# Patient Record
Sex: Male | Born: 1940 | Race: White | Hispanic: No | Smoking: Former smoker
Health system: Southern US, Community
[De-identification: ages and names within clinical notes are randomized; demographics above are authoritative.]

## PROBLEM LIST (undated history)

## (undated) DIAGNOSIS — T8859XA Other complications of anesthesia, initial encounter: Secondary | ICD-10-CM

## (undated) DIAGNOSIS — C679 Malignant neoplasm of bladder, unspecified: Secondary | ICD-10-CM

## (undated) DIAGNOSIS — H8110 Benign paroxysmal vertigo, unspecified ear: Secondary | ICD-10-CM

## (undated) DIAGNOSIS — I77810 Thoracic aortic ectasia: Secondary | ICD-10-CM

## (undated) DIAGNOSIS — N529 Male erectile dysfunction, unspecified: Secondary | ICD-10-CM

## (undated) DIAGNOSIS — Z860101 Personal history of adenomatous and serrated colon polyps: Secondary | ICD-10-CM

## (undated) DIAGNOSIS — Z86010 Personal history of colonic polyps: Secondary | ICD-10-CM

## (undated) DIAGNOSIS — N401 Enlarged prostate with lower urinary tract symptoms: Secondary | ICD-10-CM

## (undated) DIAGNOSIS — Z87442 Personal history of urinary calculi: Secondary | ICD-10-CM

## (undated) DIAGNOSIS — J189 Pneumonia, unspecified organism: Secondary | ICD-10-CM

## (undated) DIAGNOSIS — I10 Essential (primary) hypertension: Secondary | ICD-10-CM

## (undated) DIAGNOSIS — E538 Deficiency of other specified B group vitamins: Secondary | ICD-10-CM

## (undated) DIAGNOSIS — N400 Enlarged prostate without lower urinary tract symptoms: Secondary | ICD-10-CM

## (undated) DIAGNOSIS — M1990 Unspecified osteoarthritis, unspecified site: Secondary | ICD-10-CM

## (undated) DIAGNOSIS — T7840XA Allergy, unspecified, initial encounter: Secondary | ICD-10-CM

## (undated) DIAGNOSIS — Z8551 Personal history of malignant neoplasm of bladder: Secondary | ICD-10-CM

## (undated) DIAGNOSIS — F419 Anxiety disorder, unspecified: Secondary | ICD-10-CM

## (undated) HISTORY — DX: Essential (primary) hypertension: I10

## (undated) HISTORY — DX: Allergy, unspecified, initial encounter: T78.40XA

## (undated) HISTORY — DX: Malignant neoplasm of bladder, unspecified: C67.9

## (undated) HISTORY — DX: Deficiency of other specified B group vitamins: E53.8

## (undated) HISTORY — PX: TONGUE SURGERY: SHX810

## (undated) HISTORY — DX: Male erectile dysfunction, unspecified: N52.9

## (undated) HISTORY — PX: CATARACT EXTRACTION: SUR2

## (undated) HISTORY — DX: Unspecified osteoarthritis, unspecified site: M19.90

## (undated) HISTORY — DX: Benign prostatic hyperplasia without lower urinary tract symptoms: N40.0

## (undated) HISTORY — DX: Anxiety disorder, unspecified: F41.9

## (undated) HISTORY — DX: Personal history of urinary calculi: Z87.442

## (undated) HISTORY — PX: COLONOSCOPY: SHX174

## (undated) HISTORY — PX: LITHOTRIPSY: SUR834

## (undated) HISTORY — DX: Personal history of colonic polyps: Z86.010

## (undated) HISTORY — PX: TRANSURETHRAL RESECTION OF BLADDER TUMOR: SHX2575

## (undated) HISTORY — PX: OTHER SURGICAL HISTORY: SHX169

## (undated) HISTORY — PX: EXTRACORPOREAL SHOCK WAVE LITHOTRIPSY: SHX1557

---

## 1997-11-04 ENCOUNTER — Emergency Department (HOSPITAL_COMMUNITY): Admission: EM | Admit: 1997-11-04 | Discharge: 1997-11-04 | Payer: Self-pay | Admitting: Emergency Medicine

## 1997-11-07 ENCOUNTER — Ambulatory Visit (HOSPITAL_COMMUNITY): Admission: RE | Admit: 1997-11-07 | Discharge: 1997-11-07 | Payer: Self-pay | Admitting: Urology

## 1997-11-11 ENCOUNTER — Ambulatory Visit (HOSPITAL_COMMUNITY): Admission: RE | Admit: 1997-11-11 | Discharge: 1997-11-11 | Payer: Self-pay | Admitting: Urology

## 1997-12-23 ENCOUNTER — Ambulatory Visit (HOSPITAL_COMMUNITY): Admission: RE | Admit: 1997-12-23 | Discharge: 1997-12-23 | Payer: Self-pay | Admitting: Urology

## 1998-02-09 ENCOUNTER — Ambulatory Visit (HOSPITAL_COMMUNITY): Admission: RE | Admit: 1998-02-09 | Discharge: 1998-02-09 | Payer: Self-pay | Admitting: Family Medicine

## 1999-08-06 ENCOUNTER — Other Ambulatory Visit: Admission: RE | Admit: 1999-08-06 | Discharge: 1999-08-06 | Payer: Self-pay | Admitting: Gastroenterology

## 1999-08-06 ENCOUNTER — Encounter (INDEPENDENT_AMBULATORY_CARE_PROVIDER_SITE_OTHER): Payer: Self-pay

## 2002-09-14 ENCOUNTER — Encounter: Payer: Self-pay | Admitting: Family Medicine

## 2002-09-14 ENCOUNTER — Encounter: Admission: RE | Admit: 2002-09-14 | Discharge: 2002-09-14 | Payer: Self-pay | Admitting: Family Medicine

## 2004-11-12 ENCOUNTER — Ambulatory Visit: Payer: Self-pay | Admitting: Gastroenterology

## 2004-12-10 ENCOUNTER — Encounter (INDEPENDENT_AMBULATORY_CARE_PROVIDER_SITE_OTHER): Payer: Self-pay | Admitting: *Deleted

## 2004-12-10 ENCOUNTER — Ambulatory Visit: Payer: Self-pay | Admitting: Gastroenterology

## 2005-09-27 ENCOUNTER — Encounter: Admission: RE | Admit: 2005-09-27 | Discharge: 2005-09-27 | Payer: Self-pay | Admitting: Family Medicine

## 2005-11-18 ENCOUNTER — Ambulatory Visit (HOSPITAL_COMMUNITY): Admission: RE | Admit: 2005-11-18 | Discharge: 2005-11-18 | Payer: Self-pay | Admitting: Urology

## 2006-01-23 ENCOUNTER — Ambulatory Visit (HOSPITAL_COMMUNITY): Admission: RE | Admit: 2006-01-23 | Discharge: 2006-01-23 | Payer: Self-pay | Admitting: Urology

## 2006-01-30 ENCOUNTER — Encounter: Admission: RE | Admit: 2006-01-30 | Discharge: 2006-02-04 | Payer: Self-pay | Admitting: Family Medicine

## 2007-06-04 HISTORY — PX: POLYPECTOMY: SHX149

## 2007-12-07 ENCOUNTER — Ambulatory Visit: Payer: Self-pay | Admitting: Gastroenterology

## 2007-12-21 ENCOUNTER — Ambulatory Visit: Payer: Self-pay | Admitting: Gastroenterology

## 2007-12-21 ENCOUNTER — Encounter: Payer: Self-pay | Admitting: Gastroenterology

## 2007-12-21 LAB — HM COLONOSCOPY

## 2007-12-23 ENCOUNTER — Encounter: Payer: Self-pay | Admitting: Gastroenterology

## 2008-01-03 ENCOUNTER — Encounter: Payer: Self-pay | Admitting: Internal Medicine

## 2008-01-03 LAB — CONVERTED CEMR LAB: PSA: NORMAL ng/mL

## 2008-02-18 ENCOUNTER — Encounter: Admission: RE | Admit: 2008-02-18 | Discharge: 2008-02-18 | Payer: Self-pay | Admitting: Family Medicine

## 2008-06-03 HISTORY — PX: CATARACT EXTRACTION W/ INTRAOCULAR LENS IMPLANT: SHX1309

## 2008-09-07 ENCOUNTER — Ambulatory Visit: Payer: Self-pay | Admitting: Internal Medicine

## 2008-09-07 DIAGNOSIS — N400 Enlarged prostate without lower urinary tract symptoms: Secondary | ICD-10-CM | POA: Insufficient documentation

## 2008-09-07 DIAGNOSIS — Z860100 Personal history of colon polyps, unspecified: Secondary | ICD-10-CM | POA: Insufficient documentation

## 2008-09-07 DIAGNOSIS — Z87442 Personal history of urinary calculi: Secondary | ICD-10-CM | POA: Insufficient documentation

## 2008-09-07 DIAGNOSIS — I10 Essential (primary) hypertension: Secondary | ICD-10-CM | POA: Insufficient documentation

## 2008-09-13 ENCOUNTER — Encounter: Payer: Self-pay | Admitting: Internal Medicine

## 2009-04-02 ENCOUNTER — Encounter: Admission: RE | Admit: 2009-04-02 | Discharge: 2009-04-02 | Payer: Self-pay | Admitting: Orthopaedic Surgery

## 2009-12-08 ENCOUNTER — Ambulatory Visit: Payer: Self-pay | Admitting: Internal Medicine

## 2009-12-11 ENCOUNTER — Ambulatory Visit (HOSPITAL_COMMUNITY): Admission: RE | Admit: 2009-12-11 | Discharge: 2009-12-11 | Payer: Self-pay | Admitting: Urology

## 2009-12-11 LAB — CONVERTED CEMR LAB
ALT: 21 units/L (ref 0–53)
AST: 21 units/L (ref 0–37)
Albumin: 4.3 g/dL (ref 3.5–5.2)
Alkaline Phosphatase: 74 units/L (ref 39–117)
BUN: 10 mg/dL (ref 6–23)
Basophils Absolute: 0 10*3/uL (ref 0.0–0.1)
Basophils Relative: 1 % (ref 0–1)
CO2: 24 meq/L (ref 19–32)
Calcium: 9.9 mg/dL (ref 8.4–10.5)
Chloride: 104 meq/L (ref 96–112)
Creatinine, Ser: 0.82 mg/dL (ref 0.40–1.50)
Eosinophils Absolute: 0.1 10*3/uL (ref 0.0–0.7)
Eosinophils Relative: 2 % (ref 0–5)
Glucose, Bld: 87 mg/dL (ref 70–99)
HCT: 41.6 % (ref 39.0–52.0)
Hemoglobin: 14.4 g/dL (ref 13.0–17.0)
Lymphocytes Relative: 33 % (ref 12–46)
Lymphs Abs: 2.2 10*3/uL (ref 0.7–4.0)
MCHC: 34.6 g/dL (ref 30.0–36.0)
MCV: 91.8 fL (ref 78.0–100.0)
Monocytes Absolute: 0.8 10*3/uL (ref 0.1–1.0)
Monocytes Relative: 12 % (ref 3–12)
Neutro Abs: 3.5 10*3/uL (ref 1.7–7.7)
Neutrophils Relative %: 53 % (ref 43–77)
Platelets: 285 10*3/uL (ref 150–400)
Potassium: 4.7 meq/L (ref 3.5–5.3)
RBC: 4.53 M/uL (ref 4.22–5.81)
RDW: 13.3 % (ref 11.5–15.5)
Sodium: 139 meq/L (ref 135–145)
TSH: 2.044 microintl units/mL (ref 0.350–4.500)
Total Bilirubin: 0.8 mg/dL (ref 0.3–1.2)
Total Protein: 7.1 g/dL (ref 6.0–8.3)
WBC: 6.6 10*3/uL (ref 4.0–10.5)

## 2009-12-13 ENCOUNTER — Emergency Department (HOSPITAL_COMMUNITY): Admission: EM | Admit: 2009-12-13 | Discharge: 2009-12-13 | Payer: Self-pay | Admitting: Emergency Medicine

## 2009-12-15 ENCOUNTER — Ambulatory Visit (HOSPITAL_COMMUNITY): Admission: AD | Admit: 2009-12-15 | Discharge: 2009-12-15 | Payer: Self-pay | Admitting: Urology

## 2009-12-15 HISTORY — PX: CYSTOSCOPY/URETEROSCOPY/HOLMIUM LASER/STENT PLACEMENT: SHX6546

## 2010-01-03 ENCOUNTER — Observation Stay (HOSPITAL_COMMUNITY): Admission: RE | Admit: 2010-01-03 | Discharge: 2010-01-04 | Payer: Self-pay | Admitting: Urology

## 2010-01-03 ENCOUNTER — Encounter (INDEPENDENT_AMBULATORY_CARE_PROVIDER_SITE_OTHER): Payer: Self-pay | Admitting: Urology

## 2010-02-19 ENCOUNTER — Ambulatory Visit (HOSPITAL_BASED_OUTPATIENT_CLINIC_OR_DEPARTMENT_OTHER): Admission: RE | Admit: 2010-02-19 | Discharge: 2010-02-19 | Payer: Self-pay | Admitting: Urology

## 2010-04-03 ENCOUNTER — Encounter: Payer: Self-pay | Admitting: Internal Medicine

## 2010-07-01 LAB — CONVERTED CEMR LAB
ALT: 20 units/L (ref 0–53)
AST: 22 units/L (ref 0–37)
Albumin: 4.3 g/dL (ref 3.5–5.2)
Alkaline Phosphatase: 74 units/L (ref 39–117)
BUN: 11 mg/dL (ref 6–23)
Basophils Absolute: 0 10*3/uL (ref 0.0–0.1)
Basophils Relative: 0.1 % (ref 0.0–3.0)
Bilirubin, Direct: 0 mg/dL (ref 0.0–0.3)
CO2: 30 meq/L (ref 19–32)
Calcium: 9.5 mg/dL (ref 8.4–10.5)
Chloride: 110 meq/L (ref 96–112)
Creatinine, Ser: 0.8 mg/dL (ref 0.4–1.5)
Eosinophils Absolute: 0.1 10*3/uL (ref 0.0–0.7)
Eosinophils Relative: 2.3 % (ref 0.0–5.0)
GFR calc non Af Amer: 102.1 mL/min (ref >60)
Glucose, Bld: 85 mg/dL (ref 70–99)
HCT: 44.1 % (ref 39.0–52.0)
Hemoglobin: 15.2 g/dL (ref 13.0–17.0)
Lymphocytes Relative: 36.4 % (ref 12.0–46.0)
Lymphs Abs: 2 10*3/uL (ref 0.7–4.0)
MCHC: 34.5 g/dL (ref 30.0–36.0)
MCV: 94.9 fL (ref 78.0–100.0)
Monocytes Absolute: 0.8 10*3/uL (ref 0.1–1.0)
Monocytes Relative: 13.6 % — ABNORMAL HIGH (ref 3.0–12.0)
Neutro Abs: 2.7 10*3/uL (ref 1.4–7.7)
Neutrophils Relative %: 47.6 % (ref 43.0–77.0)
Platelets: 244 10*3/uL (ref 150.0–400.0)
Potassium: 4.6 meq/L (ref 3.5–5.1)
RBC: 4.65 M/uL (ref 4.22–5.81)
RDW: 12.9 % (ref 11.5–14.6)
Sodium: 144 meq/L (ref 135–145)
TSH: 2.3 microintl units/mL (ref 0.35–5.50)
Total Bilirubin: 1.1 mg/dL (ref 0.3–1.2)
Total Protein: 7.3 g/dL (ref 6.0–8.3)
WBC: 5.6 10*3/uL (ref 4.5–10.5)

## 2010-07-03 NOTE — Letter (Signed)
Summary: Patient Notice- Polyp Results  [facility]  [address] [place], [place] [address]   Phone: [phone]  Fax: [phone]        December 23, 2007 MRN: [id number]    Richard King [address] [place], [place]  [address]    Dear [name],  I am pleased to inform you that the colon polyp(s) removed during your recent colonoscopy was (were) found to be benign (no cancer detected) upon pathologic examination.  I recommend you have a repeat colonoscopy examination in 5_ years to look for recurrent polyps, as having colon polyps increases your risk for having recurrent polyps or even colon cancer in the future.  Should you develop new or worsening symptoms of abdominal pain, bowel habit changes or bleeding from the rectum or bowels, please schedule an evaluation with either your primary care physician or with me.  Additional information/recommendations:  __ No further action with gastroenterology is needed at this time. Please      follow-up with your primary care physician for your other healthcare      needs.  __ Please call [phone] to schedule a return visit to review your      situation.  __ Please keep your follow-up visit as already scheduled.  xx__ Continue treatment plan as outlined the day of your exam.  Please call us if you are having persistent problems or have questions about your condition that have not been fully answered at this time.  Sincerely,  Mardella Layman MD [facility]  This letter has been electronically signed by your physician.

## 2010-07-03 NOTE — Assessment & Plan Note (Signed)
Summary: NEW PT TO EST/CLE   Vital Signs:  Patient profile:   70 year old male Height:      71 inches Weight:      224 pounds BMI:     31.35 Temp:     98 degrees F oral Pulse rate:   70 / minute Pulse rhythm:   regular BP sitting:   120 / 68  (left arm) Cuff size:   regular  Vitals Entered By: Mervin Hack CMA (September 07, 2008 11:40 AM)  History of Present Illness: CC: new patient to establish care  His doctor retired Hospital doctor to Dr Artis Flock  for 3 years--left due to Programmer, multimedia  HTN goes back to 2002 No problems with current medicine No heart problems  Kidney stones requiring lithotripsy several times HAd rectal exam and PSA test by Dr Konrad Dolores him once a year  Notes urinary problems Nocturia x 3-4 No sig daytime symptoms  has had colon and polyps Dr Jarold Motto   Preventive Screening-Counseling & Management     Smoking Status: quit  Allergies (verified): 1)  ! 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 Eyes:  Denies double vision and vision loss-1 eye. ENT:  Complains of decreased hearing; denies ringing in ears; hearing problems throughout both sides of family teeth okay--sees dentist. CV:   Complains of lightheadness and shortness of breath with exertion; denies chest pain or discomfort, difficulty breathing at night, difficulty breathing while lying down, fainting, palpitations, and swelling of feet; occ mild dizzy feeling--thinks it may be inner ear related Seems to be equilibrium problems chronic DOE--no recent change. Resp:  Denies cough and shortness of breath. GI:  Complains of constipation; denies abdominal pain, bloody stools, change in bowel habits, dark tarry stools, indigestion, nausea, and vomiting; occ mild constipation. GU:  Complains of erectile dysfunction and nocturia; denies urinary frequency and urinary hesitancy; did get Rx for viagra but never took it. MS:  Denies joint pain and joint swelling. Derm:  Denies lesion(s) and rash; gets itching and flaking in ears. Neuro:  Complains of tingling; denies headaches, numbness, and weakness; occ very brief tingling in hands. Psych:  Denies anxiety and depression. Heme:  Denies abnormal bruising and enlarge lymph nodes. Allergy:  Complains of itching eyes, seasonal allergies, and sneezing; mild spring allergies no meds.  Physical Exam  General:  alert and normal appearance.   Eyes:  pupils equal and pupils round.   Mouth:  no erythema and no lesions.   Neck:  supple, no masses, no thyromegaly, no carotid bruits, and no  cervical lymphadenopathy.   Lungs:  normal respiratory effort and normal breath sounds.   Heart:  normal rate, regular rhythm, no murmur, and no gallop.   Abdomen:  soft, non-tender, no masses, no hepatomegaly, and no splenomegaly.   Msk:  no joint tenderness and no joint swelling.   Pulses:  2+ in feet Extremities:  no edema Neurologic:  alert & oriented X3 and strength normal in all extremities.   Skin:  no rashes and no suspicious lesions.   Axillary Nodes:  No palpable lymphadenopathy Psych:  normally interactive, good eye contact, not anxious appearing, and not depressed appearing.      Impression & Recommendations:  Problem # 1:  HYPERTENSION (ICD-401.9) Assessment Comment Only  good control no changes needed  His updated medication list for this problem includes:    Verapamil Hcl Cr 240 Mg Cr-tabs (Verapamil hcl) .Marland Kitchen... Take 1 tablet by mouth once daily  BP today: 120/68  Orders: TLB-BMP (Basic Metabolic Panel-BMET) (80048-METABOL) TLB-CBC Platelet - w/Differential (85025-CBCD) TLB-Hepatic/Liver Function Pnl (80076-HEPATIC) TLB-TSH (Thyroid Stimulating Hormone) (84443-TSH) Venipuncture ([address])  Problem # 2:  BENIGN PROSTATIC HYPERTROPHY (ICD-600.00) Assessment: Unchanged mild symptoms but doing okay with meds  Problem # 3:  NEPHROLITHIASIS, HX OF (ICD-V13.01) Assessment: Comment Only sees Dr Isabel Caprice yearly he checks PSA also  Complete Medication List: 1)  Verapamil Hcl Cr 240 Mg Cr-tabs (Verapamil hcl) .... Take 1 tablet by mouth once daily  Other Orders: Tetanus Toxoid w/Dx (60454) Admin 1st Vaccine (09811)  Patient Instructions: 1)  Please schedule a follow-up appointment in 1 year.  Prescriptions: VERAPAMIL HCL CR 240 MG CR-TABS (VERAPAMIL HCL) take 1 tablet by mouth once daily  #30 x 12   Entered and Authorized by:   Cindee Salt MD   Signed by:   Cindee Salt MD on 09/07/2008   Method used:   Print then Give to Patient   RxID:   [id number]       Current Allergies (reviewed today): ! * IV DYE  Immunizations Administered:  Tetanus Vaccine:    Vaccine Type: Td    Site: left deltoid    Mfr: GlaxoSmithKline    Dose: 0.5 ml    Route: IM    Given by: Mervin Hack CMA    Exp. Date: 07/27/2010    Lot #: [id number]    VIS given: 04/21/07 version given September 07, 2008.    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

## 2010-07-03 NOTE — Letter (Signed)
Summary: [facility]  [facility]   Imported By: Maryln Gottron 04/09/2010 15:23:19  _____________________________________________________________________  External Attachment:    Type:   Image     Comment:   External Document  Appended Document: [facility] cystoscopy 3 months after removal of bladder tumor only mild atypia noted Planning to start BCG Rx

## 2010-07-03 NOTE — Miscellaneous (Signed)
Summary: previsit rx.  Clinical Lists Changes  Medications: Added new medication of MOVIPREP 100 GM  SOLR (PEG-KCL-NACL-NASULF-NA ASC-C) As per prep instructions. - Signed Rx of MOVIPREP 100 GM  SOLR (PEG-KCL-NACL-NASULF-NA ASC-C) As per prep instructions.;  #1 x 0;  Signed;  Entered by: Darlyn Read RN;  Authorized by: Mardella Layman MD [facility];  Method used: Print then Give to Patient    Prescriptions: MOVIPREP 100 GM  SOLR (PEG-KCL-NACL-NASULF-NA ASC-C) As per prep instructions.  #1 x 0   Entered by:   Darlyn Read RN   Authorized by:   Mardella Layman MD [facility]   Signed by:   Milford Cage CMA on 12/07/2007   Method used:   Print then Give to Patient   RxID:   [id number]

## 2010-07-03 NOTE — Miscellaneous (Signed)
Summary: PSA:  Clinical Lists Changes  Observations: Added new observation of PSA: normal@ 2.2 (01/03/2008 12:46)        Preventive Care Screening  PSA:    Date:  01/03/2008    Results:  normal@ 2.2 Dr. Isabel Caprice, [facility]

## 2010-07-03 NOTE — Miscellaneous (Signed)
Summary: Pneumovax  Clinical Lists Changes  Orders: Added new Service order of Pneumococcal Vaccine ([address]) - Signed Added new Service order of Admin 1st Vaccine ([address]454) - Signed Observations: Added new observation of PNEUMOVAXVIS: 12/30/95 version given September 07, 2008. (09/07/2008 12:55) Added new observation of PNEUMOVAXLOT: 0981X (09/07/2008 12:55) Added new observation of PNEUMOVAXEXP: 05/06/2009 (09/07/2008 12:55) Added new observation of PNEUMOVAXBY: DeShannon Smith CMA (09/07/2008 12:55) Added new observation of PNEUMOVAXRTE: IM (09/07/2008 12:55) Added new observation of PNEUMOVAXMFR: Merck (09/07/2008 12:55) Added new observation of PNEUMOVAXSIT: right deltoid (09/07/2008 12:55) Added new observation of PNEUMOVAX: Pneumovax (09/07/2008 12:55)      Pneumovax Vaccine    Vaccine Type: Pneumovax    Site: right deltoid    Mfr: Merck    Dose: 0.5 ml    Route: IM    Given by: Mervin Hack CMA    Exp. Date: 05/06/2009    Lot #: [id number]    VIS given: 12/30/95 version given September 07, 2008.

## 2010-07-03 NOTE — Assessment & Plan Note (Signed)
Summary: FOLLOW UP/[facility]H   Vital Signs:  Patient profile:   70 year old male Weight:      226 pounds BMI:     31.63 Temp:     98.2 degrees F oral Pulse rate:   68 / minute Pulse rhythm:   regular BP sitting:   124 / 90  (right arm) Cuff size:   large  Vitals Entered By: Lowella Petties CMA (December 08, 2009 2:16 PM) CC: follow-up visit   History of Present Illness: doing okay except for kidney stones needs lithotripsy next week for persistent stones Passed one  a while ago but has had several attacks while travelling in the past 3 weeks No hematuria now They are calcium stones citrate no help and allopurinol no help in the past  Voids fairly well slow stream nocturia x 2-3 at worst--better if he limits evening fluids No sig daytime problems  BP has always been okay---even with the kidney stones No headaches no chest pain Mild DOE---no sig change   Allergies: 1)  ! * Iv Dye  Past History:  Past medical, surgical, family and social histories (including risk factors) reviewed for relevance to current acute and chronic problems.  Past Medical History: Reviewed history from 09/07/2008 and no changes required. Hypertension Nephrolithiasis, hx of-----------------------------Dr Grapey Colonic polyps, hx of   Benign prostatic hypertrophy Erectile dysfunction  Past Surgical History: Reviewed history from 09/07/2008 and no changes required. 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 Mom died @94  of old age Dad died @76  had HTN, ??cause of death 1 sister-8 years older Some CAD on Dad's side No DM No prostate or colon cancer  Social History: Reviewed history from 09/07/2008 and no changes required. 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         wt fairly stable appetite is fine  Physical Exam  General:  alert and normal appearance.   Neck:  supple, no masses, no thyromegaly, no carotid bruits, and no cervical lymphadenopathy.   Lungs:  normal respiratory effort and normal breath sounds.   Heart:  normal rate, regular rhythm, no murmur, and no gallop.   Abdomen:  soft and non-tender.   Extremities:  no edema Psych:  normally interactive, good eye contact, not anxious appearing, and not depressed appearing.     Impression & Recommendations:  Problem # 1:  HYPERTENSION (ICD-401.9) Assessment Unchanged  good control no changes needed  His updated medication list for this problem includes:    Verapamil Hcl Cr 240 Mg Cr-tabs (Verapamil hcl) .Marland Kitchen... Take 1 tablet by mouth once daily  BP today: 124/90 Prior BP: 120/68 (09/07/2008)  Labs Reviewed: K+: 4.6 (09/07/2008) Creat: : 0.8 (09/07/2008)     Orders: Venipuncture (54098) Specimen Handling (11914) T-Comprehensive Metabolic Panel (78295-62130) T-CBC w/Diff (86578-46962) T-TSH (95284-13244)  Problem # 2:  NEPHROLITHIASIS, HX OF (ICD-V13.01) Assessment: Comment Only new recurrences Dr Isabel Caprice treating will recheck labs  ?stone type  Problem # 3:  BENIGN PROSTATIC HYPERTROPHY (ICD-600.00) Assessment: Unchanged mild symptoms with increased fluid intake due to stones but no major problems  Complete Medication List: 1)  Verapamil Hcl Cr 240 Mg Cr-tabs (Verapamil hcl) .... Take 1 tablet by mouth once daily  Patient Instructions: 1)  Please schedule a follow-up appointment in 1 year.  Prescriptions: VERAPAMIL HCL CR 240 MG CR-TABS (VERAPAMIL HCL) take 1 tablet by mouth once daily  #30 x 12   Entered and Authorized by:   Cindee Salt MD   Signed by:   Cindee Salt MD on 12/08/2009   Method used:   Print then Give to Patient   RxID:   [id number]   Prior  Medications (reviewed today): VERAPAMIL HCL CR 240 MG CR-TABS (VERAPAMIL HCL) take 1 tablet by mouth once daily Current Allergies: ! * IV DYE

## 2010-07-03 NOTE — Procedures (Signed)
Summary: Colonoscopy   Colonoscopy  Procedure date:  12/21/2007  Findings:      Location:  [facility].    Procedures Next Due Date:    Colonoscopy: 01/2013  Patient Name: Chirag, Richard King MRN:  Procedure Procedures: Colonoscopy CPT: 63016.  Personnel: Endoscopist: Vania Rea. Jarold Motto, MD.  Exam Location: Exam performed in Outpatient Clinic. Outpatient  Patient Consent: Procedure, Alternatives, Risks and Benefits discussed, consent obtained, from patient. Consent was obtained by the RN.  Indications  Surveillance of: Adenomatous Polyp(s).  History  Current Medications: Patient is not currently taking Coumadin.  Medical/ Surgical History: Hypertension,  Pre-Exam Physical: Performed Dec 10, 2004. Entire physical exam was normal. Cardio- pulmonary exam, Rectal exam, Abdominal exam, Extremity exam, Mental status exam WNL.  Comments: Pt. history reviewed/updated, physical exam performed prior to initiation of sedation? YES Exam Exam: Extent of exam reached: Cecum, extent intended: Cecum.  The cecum was identified by appendiceal orifice and IC valve. Patient position: on left side. Time to Cecum: 00:02:55. Time for Withdrawl: 00:05:38. Colon retroflexion performed. Images taken. ASA Classification: II. Tolerance: excellent.  Monitoring: Pulse and BP monitoring, Oximetry used. Supplemental O2 given. at 2 Liters.  Colon Prep Used Golytely for colon prep. Prep results: good.  Sedation Meds: Patient assessed and found to be appropriate for moderate (conscious) sedation. Sedation was managed by the Endoscopist. Fentanyl 75 mcg. given IV. Versed 7 mg. given IV.  Instrument(s): CF 140L. Serial [id number].  Findings - NORMAL EXAM: Cecum to Splenic Flexure. Not Seen: Polyps. Colitis. Tumors. Crohn's.  - DIVERTICULOSIS: Descending Colon to Sigmoid Colon. Not bleeding. ICD9: Diverticulosis, Colon: 562.10. Comments: PIGMENTARY CHANGES OF MELANOSIS COLI  NOTED.  - POLYP: Sigmoid Colon, Maximum size: 4 mm. sessile polyp. Procedure:  hot biopsy, removed, retrieved, Polyp sent to pathology. ICD9: Colon Polyps: 211.3.  - NORMAL EXAM: Sigmoid Colon to Rectum. Not Seen: Crohn's. Hemorrhoids.   Assessment  Diagnoses: 562.10: Diverticulosis, Colon.  211.3: Colon Polyps.   Events  Unplanned Interventions: No intervention was required.  Plans Medication Plan: Continue current medications.  Patient Education: Patient given standard instructions for: Polyps. Diverticulosis. Patient instructed to get routine colonoscopy every 5 years.  Disposition: After procedure patient sent to recovery. After recovery patient sent home.  Scheduling/Referral: Follow-Up prn. Await pathology to schedule patient.     cc.   Barbera Setters. Kindl,MD    REPORT OF SURGICAL PATHOLOGY   Case #: [id number] Patient Name: Richard King, Richard King. Office Chart Number:  [id number]   MRN: [id number] Pathologist: Alden Server A. Delila Spence, MD DOB/Age  06-29-1940 (Age: 70)    Gender: M Date Taken:  12/21/2007 Date Received: 12/21/2007   FINAL DIAGNOSIS   ***MICROSCOPIC EXAMINATION AND DIAGNOSIS***   SIGMOID COLON, POLYP(S):  HYPERPLASTIC POLYP(S).  NO ADENOMATOUS CHANGE OR MALIGNANCY IDENTIFIED.   cc Date Reported:  12/22/2007     Alden Server A. Delila Spence, MD *** Electronically Signed Out By EAA ***   Clinical information R/O adenoma (jes)    specimen(s) obtained Colon, polyp(s), sigmoid   Gross Description Received in formalin is a tan, soft tissue fragment that is submitted in toto.  Size:  0.2 cm One block (TA:jes,12/22/07)    jes/     Signed by Mardella Layman MD FACG,FAGA on 12/23/2007 at 1:11 PM  ________________________________________________________________________ 5 year f/u   Signed by Mardella Layman MD FACG,FAGA on 12/23/2007 at 1:11 PM    December 23, 2007 MRN: [id number]    [facility] [address] [place], [place]  [address]  Dear Mr.  MCQUEEN,  I am pleased to inform you that the colon polyp(s) removed during your recent colonoscopy was (were) found to be benign (no cancer detected) upon pathologic examination.  I recommend you have a repeat colonoscopy examination in 5_ years to look for recurrent polyps, as having colon polyps increases your risk for having recurrent polyps or even colon cancer in the future.  Should you develop new or worsening symptoms of abdominal pain, bowel habit changes or bleeding from the rectum or bowels, please schedule an evaluation with either your primary care physician or with me.  Additional information/recommendations:  __ No further action with gastroenterology is needed at this time. Please      follow-up with your primary care physician for your other healthcare      needs.  __ Please call [phone] to schedule a return visit to review your      situation.  __ Please keep your follow-up visit as already scheduled.  xx__ Continue treatment plan as outlined the day of your exam.  Please call us if you are having persistent problems or have questions about your condition that have not been fully answered at this time.  Sincerely,  Mardella Layman MD [facility]  This letter has been electronically signed by your physician.  This report was created from the original endoscopy report, which was reviewed and signed by the above listed endoscopist.

## 2010-08-16 LAB — POCT I-STAT 4, (NA,K, GLUC, HGB,HCT)
Glucose, Bld: 99 mg/dL (ref 70–99)
HCT: 42 % (ref 39.0–52.0)
Hemoglobin: 14.3 g/dL (ref 13.0–17.0)
Potassium: 4.1 mEq/L (ref 3.5–5.1)
Sodium: 142 mEq/L (ref 135–145)

## 2010-08-17 LAB — SURGICAL PCR SCREEN
MRSA, PCR: NEGATIVE
Staphylococcus aureus: POSITIVE — AB

## 2010-08-18 LAB — SURGICAL PCR SCREEN
MRSA, PCR: NEGATIVE
Staphylococcus aureus: NEGATIVE

## 2010-08-19 LAB — DIFFERENTIAL
Basophils Absolute: 0 10*3/uL (ref 0.0–0.1)
Basophils Relative: 0 % (ref 0–1)
Eosinophils Absolute: 0 10*3/uL (ref 0.0–0.7)
Eosinophils Relative: 1 % (ref 0–5)
Lymphocytes Relative: 10 % — ABNORMAL LOW (ref 12–46)
Lymphs Abs: 1 10*3/uL (ref 0.7–4.0)
Monocytes Absolute: 1.1 10*3/uL — ABNORMAL HIGH (ref 0.1–1.0)
Monocytes Relative: 11 % (ref 3–12)
Neutro Abs: 7.8 10*3/uL — ABNORMAL HIGH (ref 1.7–7.7)
Neutrophils Relative %: 79 % — ABNORMAL HIGH (ref 43–77)

## 2010-08-19 LAB — URINALYSIS, ROUTINE W REFLEX MICROSCOPIC
Bilirubin Urine: NEGATIVE
Glucose, UA: NEGATIVE mg/dL
Ketones, ur: NEGATIVE mg/dL
Leukocytes, UA: NEGATIVE
Nitrite: NEGATIVE
Protein, ur: NEGATIVE mg/dL
Specific Gravity, Urine: 1.009 (ref 1.005–1.030)
Urobilinogen, UA: 0.2 mg/dL (ref 0.0–1.0)
pH: 6 (ref 5.0–8.0)

## 2010-08-19 LAB — BASIC METABOLIC PANEL
BUN: 11 mg/dL (ref 6–23)
CO2: 23 mEq/L (ref 19–32)
Calcium: 9.2 mg/dL (ref 8.4–10.5)
Chloride: 106 mEq/L (ref 96–112)
Creatinine, Ser: 1.36 mg/dL (ref 0.4–1.5)
GFR calc Af Amer: >60 mL/min (ref >60)
GFR calc non Af Amer: 52 mL/min — ABNORMAL LOW (ref >60)
Glucose, Bld: 118 mg/dL — ABNORMAL HIGH (ref 70–99)
Potassium: 3.9 mEq/L (ref 3.5–5.1)
Sodium: 137 mEq/L (ref 135–145)

## 2010-08-19 LAB — CBC
HCT: 42 % (ref 39.0–52.0)
Hemoglobin: 14.8 g/dL (ref 13.0–17.0)
MCH: 33.2 pg (ref 26.0–34.0)
MCHC: 35.3 g/dL (ref 30.0–36.0)
MCV: 94.1 fL (ref 78.0–100.0)
Platelets: 227 10*3/uL (ref 150–400)
RBC: 4.47 MIL/uL (ref 4.22–5.81)
RDW: 13.8 % (ref 11.5–15.5)
WBC: 9.9 10*3/uL (ref 4.0–10.5)

## 2010-08-19 LAB — URINE MICROSCOPIC-ADD ON

## 2010-10-01 ENCOUNTER — Other Ambulatory Visit: Payer: Self-pay | Admitting: Urology

## 2010-10-01 ENCOUNTER — Ambulatory Visit (HOSPITAL_BASED_OUTPATIENT_CLINIC_OR_DEPARTMENT_OTHER)
Admission: RE | Admit: 2010-10-01 | Discharge: 2010-10-01 | Disposition: A | Discharge status: Home / Self Care | Payer: Medicare Other | Source: Ambulatory Visit | Attending: Urology | Admitting: Urology

## 2010-10-01 DIAGNOSIS — N3080 Other cystitis without hematuria: Secondary | ICD-10-CM | POA: Insufficient documentation

## 2010-10-01 DIAGNOSIS — D090 Carcinoma in situ of bladder: Secondary | ICD-10-CM | POA: Insufficient documentation

## 2010-10-01 DIAGNOSIS — E669 Obesity, unspecified: Secondary | ICD-10-CM | POA: Insufficient documentation

## 2010-10-01 DIAGNOSIS — Z01812 Encounter for preprocedural laboratory examination: Secondary | ICD-10-CM | POA: Insufficient documentation

## 2010-10-01 DIAGNOSIS — I10 Essential (primary) hypertension: Secondary | ICD-10-CM | POA: Insufficient documentation

## 2010-10-01 HISTORY — PX: CYSTOSTOMY W/ BLADDER BIOPSY: SHX1431

## 2010-10-10 NOTE — Op Note (Signed)
[name], [name] NO.:  [id number]  MEDICAL RECORD NO.:  [id number]            PATIENT TYPE:  LOCATION:                                 FACILITY:  PHYSICIAN:  Richard King, M.D.  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  PROCEDURES PERFORMED: 1. Cystoscopy. 2. Bladder washings for cytology. 3. Bilateral retrograde pyelograms. 4. Biopsy of lateral wall of the bladder and prostatic urethra.  SURGEON:  Richard King, M.D.  ANESTHESIA:  General.  INDICATIONS:  Richard King is 70 years of age.  Approximately 9 months ago, the patient was noted to have a large tumor involving the right lateral wall of his bladder noted at the time of procedure to deal with an active stone event.  The tumor was T1 grade 3.  The patient underwent a second look resection/biopsy in September of 2011.  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  The patient started and had a complete course of BCG therapy x6 weeks.  The patient came in for recent followup and cystoscopy in our office was negative.  NMP22 test however was positive and urine cytology showed some atypical cells but there was not enough in the specimen to do FISH testing.  The patient now presents for reevaluation of his urothelium.  Full informed consent has been obtained.  TECHNIQUE AND FINDINGS:  The patient was brought to the operating room. He received perioperative Cipro and placement of PAS compression boots. He had successful induction of general anesthesia, was placed in lithotomy position and prepped and draped in the usual manner. Cystoscopy revealed  unremarkable anterior urethra.  In his prostatic urethra, there was a small little area of abnormal mucosa along the lateral aspect of his lateral prostate lobe on the right side.  Careful evaluation of his bladder did not reveal any obvious bladder tumor. There was some slight erythema of the right lateral wall of his bladder near the area of previous resection.  Retrograde pyelograms were done with cone-tip catheters bilaterally. Fluoroscopic interpretation was used and the entire collecting system carefully inspected.  Both ureters were free of any filling defects or evidence of obstruction and I felt that both bilateral retrograde pyelograms were unremarkable.  A cold cup biopsy forceps was then utilized.  Smaller abnormality in the prostatic urethra was cold cup resected and then fulgurated with the Bugbee electrode.  Several biopsies of the right lateral wall of the bladder along the previous resection site were biopsied and then fulgurated.  Bladder washings with saline were also performed for urine cytology.  Hemostasis was excellent at the completion of the procedure.  The patient appeared to tolerate the procedure well.  There were no obvious complications.     Richard King, M.D.     DSG/MEDQ  D:  10/04/2010  T:  10/04/2010  Job:  [id number]  Electronically Signed by Barron Alvine M.D. on 10/10/2010 08:44:49 AM

## 2010-11-09 ENCOUNTER — Encounter: Payer: Self-pay | Admitting: Internal Medicine

## 2010-11-09 ENCOUNTER — Ambulatory Visit (INDEPENDENT_AMBULATORY_CARE_PROVIDER_SITE_OTHER): Payer: Medicare Other | Admitting: Internal Medicine

## 2010-11-09 VITALS — BP 139/88 | HR 68 | Temp 98.0°F | Wt 227.5 lb

## 2010-11-09 DIAGNOSIS — L309 Dermatitis, unspecified: Secondary | ICD-10-CM | POA: Insufficient documentation

## 2010-11-09 DIAGNOSIS — L259 Unspecified contact dermatitis, unspecified cause: Secondary | ICD-10-CM

## 2010-11-09 MED ORDER — VERAPAMIL HCL ER 240 MG PO TBCR: 240.0000 mg | EXTENDED_RELEASE_TABLET | Freq: Every day | ORAL | Status: DC

## 2010-11-09 MED ORDER — TRIAMCINOLONE ACETONIDE 0.1 % EX CREA: TOPICAL_CREAM | Freq: Two times a day (BID) | CUTANEOUS | Status: DC

## 2010-11-09 NOTE — Progress Notes (Signed)
  Subjective:    Patient ID: Richard King, male    DOB: 11-Nov-1940, 70 y.o.   MRN: [id number]  HPI Has rash ---started ~3 days ago Regularly in woods bushhogging, etc Last tick was some time ago  No fever Under left arm and along right elbow  Did just have BCG instillation for bladder cancer  Itchy on right arm at first--better now Better with some cortaid  No current outpatient prescriptions on file prior to visit.   Past Medical History  Diagnosis Date  . Hypertension   . History of nephrolithiasis   . Hx of colonic polyps   . BPH (benign prostatic hypertrophy)   . ED (erectile dysfunction)     Past Surgical History  Procedure Date  . Lithotripsy   . 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 Hypertension Father   . Cancer Neg Hx   . Diabetes Neg Hx     History   Social History  . Marital Status: Married    Spouse Name: N/A    Number of Children: 3  . Years of Education: N/A   Occupational History  . 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 Smoking status: Former Smoker    Quit date: 06/03/1976  . Smokeless tobacco: Not on file  . Alcohol Use: No  . Drug Use: No  . Sexually Active: Not on file   Other Topics Concern  . Not on file   Social History Narrative  . No narrative on file   Review of Systems No nausea Eating fine     Objective:   Physical Exam  Constitutional: He appears well-developed and well-nourished. No distress.  Skin:       Red area in left axilla Slightly warm but not tender  Diffuse redness with slight vesicular area in right antecubital fossa          Assessment & Plan:

## 2010-11-09 NOTE — Assessment & Plan Note (Signed)
Looks like contact rash---new deodorant but not sure why it would only be on one side Could be plant  Will try cortisone cream Derm if persists

## 2011-05-10 ENCOUNTER — Encounter: Payer: Self-pay | Admitting: Internal Medicine

## 2011-05-10 ENCOUNTER — Ambulatory Visit (INDEPENDENT_AMBULATORY_CARE_PROVIDER_SITE_OTHER): Payer: Medicare Other | Admitting: Internal Medicine

## 2011-05-10 VITALS — BP 131/78 | HR 61 | Temp 98.1°F | Ht 71.0 in | Wt 232.0 lb

## 2011-05-10 DIAGNOSIS — I10 Essential (primary) hypertension: Secondary | ICD-10-CM

## 2011-05-10 DIAGNOSIS — C679 Malignant neoplasm of bladder, unspecified: Secondary | ICD-10-CM | POA: Insufficient documentation

## 2011-05-10 DIAGNOSIS — N400 Enlarged prostate without lower urinary tract symptoms: Secondary | ICD-10-CM

## 2011-05-10 DIAGNOSIS — Z0000 Encounter for general adult medical examination without abnormal findings: Secondary | ICD-10-CM

## 2011-05-10 LAB — BASIC METABOLIC PANEL
BUN: 10 mg/dL (ref 6–23)
CO2: 28 mEq/L (ref 19–32)
Calcium: 9.5 mg/dL (ref 8.4–10.5)
Chloride: 109 mEq/L (ref 96–112)
Creatinine, Ser: 0.9 mg/dL (ref 0.4–1.5)
GFR: 88.44 mL/min (ref >60.00)
Glucose, Bld: 84 mg/dL (ref 70–99)
Potassium: 4.4 mEq/L (ref 3.5–5.1)
Sodium: 142 mEq/L (ref 135–145)

## 2011-05-10 LAB — CBC WITH DIFFERENTIAL/PLATELET
Basophils Absolute: 0 10*3/uL (ref 0.0–0.1)
Basophils Relative: 0.5 % (ref 0.0–3.0)
Eosinophils Absolute: 0.1 10*3/uL (ref 0.0–0.7)
Eosinophils Relative: 2 % (ref 0.0–5.0)
HCT: 42.5 % (ref 39.0–52.0)
Hemoglobin: 14.8 g/dL (ref 13.0–17.0)
Lymphocytes Relative: 31 % (ref 12.0–46.0)
Lymphs Abs: 1.8 10*3/uL (ref 0.7–4.0)
MCHC: 34.8 g/dL (ref 30.0–36.0)
MCV: 95.1 fl (ref 78.0–100.0)
Monocytes Absolute: 0.6 10*3/uL (ref 0.1–1.0)
Monocytes Relative: 10.7 % (ref 3.0–12.0)
Neutro Abs: 3.2 10*3/uL (ref 1.4–7.7)
Neutrophils Relative %: 55.8 % (ref 43.0–77.0)
Platelets: 247 10*3/uL (ref 150.0–400.0)
RBC: 4.47 Mil/uL (ref 4.22–5.81)
RDW: 13.9 % (ref 11.5–14.6)
WBC: 5.7 10*3/uL (ref 4.5–10.5)

## 2011-05-10 LAB — HEPATIC FUNCTION PANEL
ALT: 23 U/L (ref 0–53)
AST: 22 U/L (ref 0–37)
Albumin: 4 g/dL (ref 3.5–5.2)
Alkaline Phosphatase: 79 U/L (ref 39–117)
Bilirubin, Direct: 0.1 mg/dL (ref 0.0–0.3)
Total Bilirubin: 0.8 mg/dL (ref 0.3–1.2)
Total Protein: 7.3 g/dL (ref 6.0–8.3)

## 2011-05-10 LAB — TSH: TSH: 1.79 u[IU]/mL (ref 0.35–5.50)

## 2011-05-10 NOTE — Assessment & Plan Note (Signed)
I have personally reviewed the Medicare Annual Wellness questionnaire and have noted 1. The patient's medical and social history 2. Their use of alcohol, tobacco or illicit drugs 3. Their current medications and supplements 4. The patient's functional ability including ADL's, fall risks, home safety risks and hearing or visual             impairment. 5. Diet and physical activities 6. Evidence for depression or mood disorders  The patients weight, height, BMI and visual acuity have been recorded in the chart I have made referrals, counseling and provided education to the patient based review of the above and I have provided the pt with a written personalized care plan for preventive services.  I have provided you with a copy of your personalized plan for preventive services. Please take the time to review along with your updated medication list.  Hearing problems but apparently wasn't a candidate for amplification (nerve damage?) Rx for zostavax given He didn't want flu shot

## 2011-05-10 NOTE — Assessment & Plan Note (Addendum)
BP Readings from Last 3 Encounters:  05/10/11 131/78  11/09/10 139/88  12/08/09 124/90    Good control No changes needed Discussed regular walking and better eating---info given Will check labs

## 2011-05-10 NOTE — Progress Notes (Signed)
  Subjective:    Patient ID: Richard King, male    DOB: 08-29-1940, 69 y.o.   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 verapamil (CALAN-SR) 240 MG CR tablet Take 1 tablet (240 mg total) by mouth at bedtime.  30 tablet  11    Allergies  Allergen Reactions  . Ivp Dye (Iodinated Diagnostic Agents) Swelling    Past Medical History  Diagnosis Date  . Hypertension   . History of nephrolithiasis   . Hx of colonic polyps   . BPH (benign prostatic hypertrophy)   . ED (erectile dysfunction)   . Bladder cancer     Past Surgical History  Procedure Date  . Lithotripsy   . 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 Hypertension Father   . Cancer Neg Hx   . Diabetes Neg Hx     History   Social History  . Marital Status: Married    Spouse Name: N/A    Number of Children: 3  . Years of Education: N/A   Occupational History  . 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 Smoking status: Former Smoker    Quit date: 06/03/1976  . Smokeless tobacco: Never Used  . Alcohol Use: No  . Drug Use: No  . Sexually Active: Not on file   Other Topics Concern  . Not on file   Social History Narrative   Thinks he has a living will--not sureRequests wife, then daughter  Synetta Fail, to make health care decisionsWould accept resuscitation but no prolonged artifical ventilationProbably wouldn't want tube feeds   Review of Systems Appetite is fine Weight is up 5# or so Sleeps okay in general--likes to nap     Objective:   Physical Exam  Constitutional: He appears well-developed and well-nourished. No distress.  Neck: Normal range of motion. Neck supple. No thyromegaly present.  Cardiovascular: Normal rate, regular rhythm, normal heart sounds and intact distal pulses.  Exam reveals no gallop.   No murmur heard. Pulmonary/Chest: Effort normal and breath sounds normal. No respiratory distress. He has no wheezes. He has no rales.  Abdominal: Soft. There is no tenderness.  Musculoskeletal: He exhibits no edema and no tenderness.  Lymphadenopathy:    He has no cervical adenopathy.  Neurological:       President "Marylene Buerger" [id number] D-l-r-[id number]-w Recall 2/3  Psychiatric: He has a normal mood and affect. His behavior is normal. Judgment and thought content normal.          Assessment & Plan:

## 2011-05-10 NOTE — Assessment & Plan Note (Signed)
Slow voiding but not enough to warrant meds Does have regular urology follow up

## 2011-05-16 ENCOUNTER — Encounter: Payer: Self-pay | Admitting: Family Medicine

## 2011-05-16 ENCOUNTER — Ambulatory Visit (INDEPENDENT_AMBULATORY_CARE_PROVIDER_SITE_OTHER): Payer: Medicare Other | Admitting: Family Medicine

## 2011-05-16 VITALS — BP 142/78 | HR 75 | Temp 97.9°F | Wt 233.0 lb

## 2011-05-16 DIAGNOSIS — Z23 Encounter for immunization: Secondary | ICD-10-CM

## 2011-05-16 DIAGNOSIS — R42 Dizziness and giddiness: Secondary | ICD-10-CM

## 2011-05-16 NOTE — Progress Notes (Signed)
Vertigo.  'Drunk for the last 3 day, but I've drunk nothing but coffee and water.'  Woke up this weekend and the room was spinning.  He got better later that day but then he had return of sx over the next few days.  Sx worse with looking down and laying down in the bed, rolling over.  He had h/o vertigo and went for PT exercise treatment.  He has done some home exercises.  No changes in meds.  No focal neuro sx o/w- no speech changes, arm/leg weakness.  Vomited with an episode, but no F/C.  Sx can start rapidly when they occur.    Meds, vitals, and allergies reviewed.   ROS: See HPI.  Otherwise, noncontributory.  GEN: nad, alert and oriented HEENT: mucous membranes moist NECK: supple w/o LA CV: rrr.  PULM: ctab, no inc wob ABD: soft, +bs EXT: no edema SKIN: no acute rash CN 2-12 wnl B, S/S/DTR wnl x4 DHP mildly positive- similar sx but not as bad as prev per patient

## 2011-05-16 NOTE — Patient Instructions (Signed)
I would get meclizine 25mg .  Take 1/2 to 1 tab up to 3 times a day (it can make you drowsy) and keep doing the exercises.  Take care.  This should gradually get better.

## 2011-05-17 ENCOUNTER — Encounter: Payer: Self-pay | Admitting: Family Medicine

## 2011-05-17 DIAGNOSIS — R42 Dizziness and giddiness: Secondary | ICD-10-CM | POA: Insufficient documentation

## 2011-05-17 NOTE — Assessment & Plan Note (Signed)
Likely BPV, meclizine, home exercises and f/u prn.  Anatomy/pathphys dw pt. No indication to image.

## 2011-06-30 ENCOUNTER — Encounter: Payer: Self-pay | Admitting: Family Medicine

## 2011-09-13 ENCOUNTER — Other Ambulatory Visit: Payer: Self-pay | Admitting: Family Medicine

## 2011-09-13 MED ORDER — VERAPAMIL HCL ER 240 MG PO TBCR: 240.0000 mg | EXTENDED_RELEASE_TABLET | Freq: Every day | ORAL | Status: DC

## 2011-11-06 ENCOUNTER — Encounter: Payer: Self-pay | Admitting: Family Medicine

## 2011-11-06 DIAGNOSIS — N529 Male erectile dysfunction, unspecified: Secondary | ICD-10-CM | POA: Insufficient documentation

## 2012-04-17 ENCOUNTER — Ambulatory Visit (INDEPENDENT_AMBULATORY_CARE_PROVIDER_SITE_OTHER): Payer: Medicare Other

## 2012-04-17 DIAGNOSIS — Z23 Encounter for immunization: Secondary | ICD-10-CM

## 2012-05-05 ENCOUNTER — Other Ambulatory Visit: Payer: Self-pay | Admitting: Family Medicine

## 2012-05-05 DIAGNOSIS — I10 Essential (primary) hypertension: Secondary | ICD-10-CM

## 2012-05-07 ENCOUNTER — Other Ambulatory Visit (INDEPENDENT_AMBULATORY_CARE_PROVIDER_SITE_OTHER): Payer: Medicare Other

## 2012-05-07 DIAGNOSIS — I10 Essential (primary) hypertension: Secondary | ICD-10-CM

## 2012-05-07 LAB — LIPID PANEL
Cholesterol: 190 mg/dL (ref 0–200)
HDL: 49.3 mg/dL (ref >39.00)
LDL Cholesterol: 121 mg/dL — ABNORMAL HIGH (ref 0–99)
Total CHOL/HDL Ratio: 4
Triglycerides: 101 mg/dL (ref 0.0–149.0)
VLDL: 20.2 mg/dL (ref 0.0–40.0)

## 2012-05-07 LAB — COMPREHENSIVE METABOLIC PANEL
ALT: 28 U/L (ref 0–53)
AST: 26 U/L (ref 0–37)
Albumin: 4 g/dL (ref 3.5–5.2)
Alkaline Phosphatase: 92 U/L (ref 39–117)
BUN: 11 mg/dL (ref 6–23)
CO2: 28 mEq/L (ref 19–32)
Calcium: 9.6 mg/dL (ref 8.4–10.5)
Chloride: 106 mEq/L (ref 96–112)
Creatinine, Ser: 0.9 mg/dL (ref 0.4–1.5)
GFR: 91.71 mL/min (ref >60.00)
Glucose, Bld: 100 mg/dL — ABNORMAL HIGH (ref 70–99)
Potassium: 4.2 mEq/L (ref 3.5–5.1)
Sodium: 141 mEq/L (ref 135–145)
Total Bilirubin: 0.8 mg/dL (ref 0.3–1.2)
Total Protein: 7.5 g/dL (ref 6.0–8.3)

## 2012-05-08 ENCOUNTER — Encounter: Payer: Medicare Other | Admitting: Internal Medicine

## 2012-05-14 ENCOUNTER — Encounter: Payer: Medicare Other | Admitting: Family Medicine

## 2012-05-22 ENCOUNTER — Ambulatory Visit (INDEPENDENT_AMBULATORY_CARE_PROVIDER_SITE_OTHER): Payer: Medicare Other | Admitting: Family Medicine

## 2012-05-22 ENCOUNTER — Encounter: Payer: Self-pay | Admitting: Family Medicine

## 2012-05-22 VITALS — BP 130/82 | HR 64 | Temp 97.4°F | Ht 70.5 in | Wt 232.0 lb

## 2012-05-22 DIAGNOSIS — C679 Malignant neoplasm of bladder, unspecified: Secondary | ICD-10-CM

## 2012-05-22 DIAGNOSIS — I10 Essential (primary) hypertension: Secondary | ICD-10-CM

## 2012-05-22 DIAGNOSIS — Z0000 Encounter for general adult medical examination without abnormal findings: Secondary | ICD-10-CM

## 2012-05-22 MED ORDER — VERAPAMIL HCL ER 240 MG PO TBCR: 240.0000 mg | EXTENDED_RELEASE_TABLET | Freq: Every day | ORAL | Status: DC

## 2012-05-22 NOTE — Progress Notes (Signed)
I have personally reviewed the Medicare Annual Wellness questionnaire and have noted 1. The patient's medical and social history 2. Their use of alcohol, tobacco or illicit drugs 3. Their current medications and supplements 4. The patient's functional ability including ADL's, fall risks, home safety risks and hearing or visual             impairment. 5. Diet and physical activities 6. Evidence for depression or mood disorders  The patients weight, height, BMI have been recorded in the chart and visual acuity is per eye clinic.  I have made referrals, counseling and provided education to the patient based review of the above and I have provided the pt with a written personalized care plan for preventive services.  See scanned forms.  Routine anticipatory guidance given to patient.  See health maintenance. Flu 2013 Shingles 2012 PNA 2010 Tetanus 2010 Colonoscopy 2009 PSA per urology Advance directive d/w pt.  Requests wife, then daughter Synetta Fail, to make health care decisions if incapacitated.  Cognitive function addressed- see scanned forms- and if abnormal then additional documentation follows.  No AAA on CT abd 2011  Hypertension:    Using medication without problems or lightheadedness: yes Chest pain with exertion:no Edema:rare, can happen in ankles in the summer at the end of the day Short of breath: occ, but he attributes to deconditioning, this is not changed in years  PMH and SH reviewed  Meds, vitals, and allergies reviewed.   ROS: See HPI.  Otherwise negative.    GEN: nad, alert and oriented, hard of hearing.  HEENT: mucous membranes moist NECK: supple w/o LA CV: rrr. PULM: ctab, no inc wob ABD: soft, +bs EXT: no edema SKIN: no acute rash

## 2012-05-22 NOTE — Patient Instructions (Addendum)
Don't change your meds for now.  Try to stay active and get a little more exercise. Cut back on biscuits to help your sugar.  Take care.

## 2012-05-23 NOTE — Assessment & Plan Note (Signed)
See scanned forms.  Routine anticipatory guidance given to patient.  See health maintenance. Flu 2013 Shingles 2012 PNA 2010 Tetanus 2010 Colonoscopy 2009 PSA per urology Advance directive d/w pt.  Requests wife, then daughter Synetta Fail, to make health care decisions if incapacitated.  Cognitive function addressed- see scanned forms- and if abnormal then additional documentation follows.  No AAA on CT abd 2011

## 2012-05-23 NOTE — Assessment & Plan Note (Signed)
Controlled, continue as is.  Labs d/w pt.  

## 2012-05-23 NOTE — Assessment & Plan Note (Signed)
Per uro.  

## 2012-05-29 ENCOUNTER — Other Ambulatory Visit (INDEPENDENT_AMBULATORY_CARE_PROVIDER_SITE_OTHER): Payer: Self-pay | Admitting: Otolaryngology

## 2012-05-29 DIAGNOSIS — S022XXA Fracture of nasal bones, initial encounter for closed fracture: Secondary | ICD-10-CM

## 2012-05-29 DIAGNOSIS — J329 Chronic sinusitis, unspecified: Secondary | ICD-10-CM

## 2012-06-01 ENCOUNTER — Ambulatory Visit
Admission: RE | Admit: 2012-06-01 | Discharge: 2012-06-01 | Disposition: A | Discharge status: Home / Self Care | Payer: Medicare Other | Source: Ambulatory Visit | Attending: Otolaryngology | Admitting: Otolaryngology

## 2012-06-01 DIAGNOSIS — J329 Chronic sinusitis, unspecified: Secondary | ICD-10-CM

## 2012-06-01 DIAGNOSIS — S022XXA Fracture of nasal bones, initial encounter for closed fracture: Secondary | ICD-10-CM

## 2012-09-22 ENCOUNTER — Other Ambulatory Visit: Payer: Self-pay | Admitting: Family Medicine

## 2012-09-23 ENCOUNTER — Other Ambulatory Visit: Payer: Self-pay | Admitting: *Deleted

## 2012-09-23 MED ORDER — VERAPAMIL HCL ER 240 MG PO TBCR: EXTENDED_RELEASE_TABLET | ORAL | Status: DC

## 2012-09-23 NOTE — Telephone Encounter (Signed)
Pt wanted 90-day supply.

## 2013-02-25 ENCOUNTER — Encounter: Payer: Self-pay | Admitting: *Deleted

## 2013-02-25 NOTE — Telephone Encounter (Signed)
Encounter opened in error

## 2013-07-22 ENCOUNTER — Other Ambulatory Visit: Payer: Self-pay | Admitting: Family Medicine

## 2013-07-22 NOTE — Telephone Encounter (Signed)
Electronic refill request. Patient has not been seen in > 1 year.  Please advise.

## 2013-07-22 NOTE — Telephone Encounter (Signed)
Sent, please schedule CPE.  Thanks.  

## 2013-07-23 NOTE — Telephone Encounter (Signed)
Patient advised.

## 2013-10-21 ENCOUNTER — Telehealth: Payer: Self-pay | Admitting: Family Medicine

## 2013-10-21 ENCOUNTER — Encounter: Payer: Self-pay | Admitting: Family Medicine

## 2013-10-21 ENCOUNTER — Ambulatory Visit (INDEPENDENT_AMBULATORY_CARE_PROVIDER_SITE_OTHER): Payer: Medicare Other | Admitting: Family Medicine

## 2013-10-21 VITALS — BP 116/70 | HR 68 | Temp 97.9°F | Ht 71.0 in | Wt 231.0 lb

## 2013-10-21 DIAGNOSIS — I10 Essential (primary) hypertension: Secondary | ICD-10-CM

## 2013-10-21 DIAGNOSIS — Z0000 Encounter for general adult medical examination without abnormal findings: Secondary | ICD-10-CM

## 2013-10-21 MED ORDER — VERAPAMIL HCL ER 240 MG PO TBCR: EXTENDED_RELEASE_TABLET | ORAL | Status: DC

## 2013-10-21 NOTE — Progress Notes (Signed)
Pre visit review using our clinic review tool, if applicable. No additional management support is needed unless otherwise documented below in the visit note.  I have personally reviewed the Medicare Annual Wellness questionnaire and have noted 1. The patient's medical and social history 2. Their use of alcohol, tobacco or illicit drugs 3. Their current medications and supplements 4. The patient's functional ability including ADL's, fall risks, home safety risks and hearing or visual             impairment. 5. Diet and physical activities 6. Evidence for depression or mood disorders  The patients weight, height, BMI have been recorded in the chart and visual acuity is per eye clinic.  I have made referrals, counseling and provided education to the patient based review of the above and I have provided the pt with a written personalized care plan for preventive services.  See scanned forms.  Routine anticipatory guidance given to patient.  See health maintenance. Flu prev done Shingles prev done 2013 PNA 2010 Tetanus 2010 Colonoscopy due.  D/w pt.  Prostate cancer screening per uro Advance directive-Requests wife, then daughters Rodena Piety or [name] to make health care decisions Cognitive function addressed- see scanned forms- and if abnormal then additional documentation follows.   Hypertension:    Using medication without problems or lightheadedness: yes Chest pain with exertion:no Edema:rare, minimal Short of breath: occ, at baseline. Likely due to deconditioning   Average home BPs:not checked.   PMH and SH reviewed  Meds, vitals, and allergies reviewed.   ROS: See HPI.  Otherwise negative.    GEN: nad, alert and oriented HEENT: mucous membranes moist NECK: supple w/o LA CV: rrr. PULM: ctab, no inc wob ABD: soft, +bs EXT: no edema SKIN: no acute rash

## 2013-10-21 NOTE — Assessment & Plan Note (Signed)
See scanned forms.  Routine anticipatory guidance given to patient.  See health maintenance. Flu prev done Shingles prev done 2013 PNA 2010 Tetanus 2010 Colonoscopy due.  D/w pt.  Prostate cancer screening per uro Advance directive-Requests wife, then daughters Rodena Piety or [name] to make health care decisions Cognitive function addressed- see scanned forms- and if abnormal then additional documentation follows.

## 2013-10-21 NOTE — Telephone Encounter (Signed)
Relevant patient education mailed to patient.  

## 2013-10-21 NOTE — Assessment & Plan Note (Signed)
Controlled, continue as is. Return for labs. He agrees.

## 2013-10-21 NOTE — Patient Instructions (Addendum)
Call about a repeat colonoscopy.   Come back for fasting labs.  We'll contact you with your lab report. Take care.  Glad to see you.

## 2013-11-23 ENCOUNTER — Other Ambulatory Visit (INDEPENDENT_AMBULATORY_CARE_PROVIDER_SITE_OTHER): Payer: Medicare Other

## 2013-11-23 DIAGNOSIS — I10 Essential (primary) hypertension: Secondary | ICD-10-CM

## 2013-11-23 LAB — LIPID PANEL
Cholesterol: 175 mg/dL (ref 0–200)
HDL: 44 mg/dL (ref >39.00)
LDL Cholesterol: 102 mg/dL — ABNORMAL HIGH (ref 0–99)
NonHDL: 131
Total CHOL/HDL Ratio: 4
Triglycerides: 146 mg/dL (ref 0.0–149.0)
VLDL: 29.2 mg/dL (ref 0.0–40.0)

## 2013-11-23 LAB — COMPREHENSIVE METABOLIC PANEL
ALT: 19 U/L (ref 0–53)
AST: 24 U/L (ref 0–37)
Albumin: 3.9 g/dL (ref 3.5–5.2)
Alkaline Phosphatase: 68 U/L (ref 39–117)
BUN: 11 mg/dL (ref 6–23)
CO2: 28 mEq/L (ref 19–32)
Calcium: 9.2 mg/dL (ref 8.4–10.5)
Chloride: 108 mEq/L (ref 96–112)
Creatinine, Ser: 1 mg/dL (ref 0.4–1.5)
GFR: 82.5 mL/min (ref >60.00)
Glucose, Bld: 101 mg/dL — ABNORMAL HIGH (ref 70–99)
Potassium: 4.2 mEq/L (ref 3.5–5.1)
Sodium: 140 mEq/L (ref 135–145)
Total Bilirubin: 0.9 mg/dL (ref 0.2–1.2)
Total Protein: 6.7 g/dL (ref 6.0–8.3)

## 2013-11-25 ENCOUNTER — Encounter: Payer: Self-pay | Admitting: *Deleted

## 2014-09-02 ENCOUNTER — Encounter: Payer: Self-pay | Admitting: Family Medicine

## 2014-09-02 ENCOUNTER — Ambulatory Visit (INDEPENDENT_AMBULATORY_CARE_PROVIDER_SITE_OTHER): Payer: Medicare Other | Admitting: Family Medicine

## 2014-09-02 VITALS — BP 154/82 | HR 71 | Temp 97.7°F | Wt 229.2 lb

## 2014-09-02 DIAGNOSIS — R1030 Lower abdominal pain, unspecified: Secondary | ICD-10-CM | POA: Diagnosis not present

## 2014-09-02 LAB — URINALYSIS, ROUTINE W REFLEX MICROSCOPIC
Bilirubin Urine: NEGATIVE
Nitrite: POSITIVE — AB
Specific Gravity, Urine: >=1.03 — AB (ref 1.000–1.030)
Total Protein, Urine: NEGATIVE
Urine Glucose: NEGATIVE
Urobilinogen, UA: 0.2 (ref 0.0–1.0)
pH: 6 (ref 5.0–8.0)

## 2014-09-02 NOTE — Progress Notes (Signed)
Pre visit review using our clinic review tool, if applicable. No additional management support is needed unless otherwise documented below in the visit note.  H/o bladder cancer in 2013.  No blood in urine.  No burning with urination.  No FCNAVD.  occ intermittent pin prick sensation/stinging (lasting a few seconds).  It self resolves.  No clear trigger.  Can happen supine, sitting up, with activity.  No clear correlation with anything.  "It happens when it happens."  Meds, vitals, and allergies reviewed.   ROS: See HPI.  Otherwise, noncontributory.  nad rrr abd soft, not ttp, normal BS The area in question is just superior to the pubic symphysis but he has no rash/bruising/skin changes or hernia/mass.

## 2014-09-02 NOTE — Patient Instructions (Signed)
Go to the lab on the way out.  We'll contact you with your lab report. Take care.  Glad to see you.  

## 2014-09-04 DIAGNOSIS — R1030 Lower abdominal pain, unspecified: Secondary | ICD-10-CM | POA: Insufficient documentation

## 2014-09-04 MED ORDER — CIPROFLOXACIN HCL 500 MG PO TABS: 500.0000 mg | ORAL_TABLET | Freq: Two times a day (BID) | ORAL | Status: DC

## 2014-09-04 NOTE — Assessment & Plan Note (Signed)
Unclear source, see notes on u/a, would check u/a and proceed from there.  Benign exam.  D/w pt.  He agrees.

## 2014-09-15 ENCOUNTER — Telehealth: Payer: Self-pay | Admitting: Family Medicine

## 2014-09-15 NOTE — Telephone Encounter (Signed)
Pt called stating he called [facility] to get an appointment with dr Sharlett Iles.  But dr Sharlett Iles has retired and he would like dr Damita Dunnings suggestion on who he needs to see.

## 2014-09-16 NOTE — Telephone Encounter (Signed)
I would be happy for him to see anyone still in the department; I think they would all do a good job.

## 2014-09-16 NOTE — Telephone Encounter (Signed)
Called and notified patient of Dr Josefine Class comments. Patient stated that he will think about it then. Patient will look into and if have questions will call.

## 2014-09-19 ENCOUNTER — Encounter: Payer: Self-pay | Admitting: Gastroenterology

## 2014-09-27 ENCOUNTER — Ambulatory Visit (AMBULATORY_SURGERY_CENTER): Payer: Self-pay | Admitting: *Deleted

## 2014-09-27 VITALS — Ht 71.0 in | Wt 230.6 lb

## 2014-09-27 DIAGNOSIS — Z86010 Personal history of colonic polyps: Secondary | ICD-10-CM

## 2014-09-27 MED ORDER — NA SULFATE-K SULFATE-MG SULF 17.5-3.13-1.6 GM/177ML PO SOLN: 1.0000 | Freq: Once | ORAL | Status: DC

## 2014-09-27 NOTE — Progress Notes (Signed)
No egg or soy allergy No home 02 use No diet pills Pt has hx of hard to wake post op x1 surgery but no other issues with sedation Pt does not have e mail

## 2014-10-05 ENCOUNTER — Encounter: Payer: Self-pay | Admitting: Gastroenterology

## 2014-10-11 ENCOUNTER — Other Ambulatory Visit: Payer: Self-pay | Admitting: Gastroenterology

## 2014-10-11 ENCOUNTER — Ambulatory Visit (AMBULATORY_SURGERY_CENTER): Payer: Medicare Other | Admitting: Gastroenterology

## 2014-10-11 ENCOUNTER — Encounter: Payer: Self-pay | Admitting: Gastroenterology

## 2014-10-11 VITALS — BP 125/77 | HR 63 | Temp 98.3°F | Resp 16 | Ht 71.0 in | Wt 230.0 lb

## 2014-10-11 DIAGNOSIS — Z86010 Personal history of colonic polyps: Secondary | ICD-10-CM | POA: Diagnosis present

## 2014-10-11 DIAGNOSIS — K5730 Diverticulosis of large intestine without perforation or abscess without bleeding: Secondary | ICD-10-CM

## 2014-10-11 DIAGNOSIS — D125 Benign neoplasm of sigmoid colon: Secondary | ICD-10-CM

## 2014-10-11 DIAGNOSIS — K648 Other hemorrhoids: Secondary | ICD-10-CM

## 2014-10-11 DIAGNOSIS — D124 Benign neoplasm of descending colon: Secondary | ICD-10-CM

## 2014-10-11 DIAGNOSIS — K635 Polyp of colon: Secondary | ICD-10-CM

## 2014-10-11 MED ORDER — SODIUM CHLORIDE 0.9 % IV SOLN: 500.0000 mL | INTRAVENOUS | Status: DC

## 2014-10-11 NOTE — Progress Notes (Signed)
Called to room to assist during endoscopic procedure.  Patient ID and intended procedure confirmed with present staff. Received instructions for my participation in the procedure from the performing physician.  

## 2014-10-11 NOTE — Op Note (Signed)
[facility]  [address] [place], [id number]   COLONOSCOPY PROCEDURE REPORT  PATIENT: Richard, King  MR#: [id number] BIRTHDATE: 06-Nov-1940 , 74  yrs. old GENDER: male ENDOSCOPIST: Inda Castle, MD REFERRED NA:TFTDDU Duncan, M.D. PROCEDURE DATE:  10/11/2014 PROCEDURE:   Colonoscopy, screening and Colonoscopy with snare polypectomy First Screening Colonoscopy - Avg.  risk and is 50 yrs.  old or older - No.  Prior Negative Screening - Now for repeat screening. N/A  History of Adenoma - Now for follow-up colonoscopy & has been > or = to 3 yrs.  Yes hx of adenoma.  Has been 3 or more years since last colonoscopy.  Polyps Removed Today ASA CLASS:   Class II INDICATIONS:PH Colon Adenoma. MEDICATIONS: Monitored anesthesia care and Propofol 250 mg IV  DESCRIPTION OF PROCEDURE:   After the risks benefits and alternatives of the procedure were thoroughly explained, informed consent was obtained.  The digital rectal exam revealed no abnormalities of the rectum.   The LB [id number]  endoscope was introduced through the anus and advanced to the cecum, which was identified by both the appendix and ileocecal valve. No adverse events experienced.   The quality of the prep was (Suprep was used) good.  The instrument was then slowly withdrawn as the colon was fully examined.      COLON FINDINGS: Two sessile polyps measuring 5 mm in size were found in the descending colon.  Polypectomies were performed with a cold snare.  The resection was complete, the polyp tissue was completely retrieved and sent to histology.   A sessile polyp measuring 4 mm in size was found in the sigmoid colon.  A polypectomy was performed with a cold snare.  The resection was complete, the polyp tissue was completely retrieved and sent to histology.   There was moderate diverticulosis noted in the descending colon and sigmoid colon with associated muscular hypertrophy.   Internal  hemorrhoids were found.   There was mild diverticulosis noted in the transverse colon.  Retroflexed views revealed no abnormalities. The time to cecum = 3.3 Withdrawal time = 12.6   The scope was withdrawn and the procedure completed. COMPLICATIONS: There were no immediate complications.  ENDOSCOPIC IMPRESSION: 1.   Two sessile polyps were found in the descending colon; polypectomies were performed with a cold snare 2.   Sessile polyp was found in the sigmoid colon; polypectomy was performed with a cold snare 3.   There was moderate diverticulosis noted in the descending colon and sigmoid colon 4.   Internal hemorrhoids 5.   There was mild diverticulosis noted in the transverse colon  RECOMMENDATIONS: If the polyp(s) removed today are proven to be adenomatous (pre-cancerous) polyps, you will need a repeat colonoscopy in 5 years.  Otherwise You do not need to undergo routine screening colonoscopy since we usually stop screening around 78-80.   You will receive a letter within 1-2 weeks with the results of your biopsy as well as final recommendations.  Please call my office if you have not received a letter after 3 weeks.  eSigned:  Inda Castle, MD 10/11/2014 4:01 PM   cc:   PATIENT NAME:  Richard, King MR#: [id number]

## 2014-10-11 NOTE — Progress Notes (Signed)
No problems noted in the recovery room. maw 

## 2014-10-11 NOTE — Patient Instructions (Signed)
YOU HAD AN ENDOSCOPIC PROCEDURE TODAY AT THE [facility]:   Refer to the procedure report that was given to you for any specific questions about what was found during the examination.  If the procedure report does not answer your questions, please call your gastroenterologist to clarify.  If you requested that your care partner not be given the details of your procedure findings, then the procedure report has been included in a sealed envelope for you to review at your convenience later.  YOU SHOULD EXPECT: Some feelings of bloating in the abdomen. Passage of more gas than usual.  Walking can help get rid of the air that was put into your GI tract during the procedure and reduce the bloating. If you had a lower endoscopy (such as a colonoscopy or flexible sigmoidoscopy) you may notice spotting of blood in your stool or on the toilet paper. If you underwent a bowel prep for your procedure, you may not have a normal bowel movement for a few days.  Please Note:  You might notice some irritation and congestion in your nose or some drainage.  This is from the oxygen used during your procedure.  There is no need for concern and it should clear up in a day or so.  SYMPTOMS TO REPORT IMMEDIATELY:   Following lower endoscopy (colonoscopy or flexible sigmoidoscopy):  Excessive amounts of blood in the stool  Significant tenderness or worsening of abdominal pains  Swelling of the abdomen that is new, acute  Fever of 100F or higher   For urgent or emergent issues, a gastroenterologist can be reached at any hour by calling [phone].   DIET: Your first meal following the procedure should be a small meal and then it is ok to progress to your normal diet. Heavy or fried foods are harder to digest and may make you feel nauseous or bloated.  Likewise, meals heavy in dairy and vegetables can increase bloating.  Drink plenty of fluids but you should avoid alcoholic beverages for 24  hours.  ACTIVITY:  You should plan to take it easy for the rest of today and you should NOT DRIVE or use heavy machinery until tomorrow (because of the sedation medicines used during the test).    FOLLOW UP: Our staff will call the number listed on your records the next business day following your procedure to check on you and address any questions or concerns that you may have regarding the information given to you following your procedure. If we do not reach you, we will leave a message.  However, if you are feeling well and you are not experiencing any problems, there is no need to return our call.  We will assume that you have returned to your regular daily activities without incident.  If any biopsies were taken you will be contacted by phone or by letter within the next 1-3 weeks.  Please call us at [phone] if you have not heard about the biopsies in 3 weeks.    SIGNATURES/CONFIDENTIALITY: You and/or your care partner have signed paperwork which will be entered into your electronic medical record.  These signatures attest to the fact that that the information above on your After Visit Summary has been reviewed and is understood.  Full responsibility of the confidentiality of this discharge information lies with you and/or your care-partner.    Handouts were given to your care partner on polyps, diverticulosis, hemorrhoids, and a high fiber diet with liberal fluid intake.  You   may resume your current medications today. Await biopsy results. Please call if any questions or concerns.   

## 2014-10-11 NOTE — Progress Notes (Signed)
A/ox3 pleased with MAC, report to Annette RN 

## 2014-10-12 ENCOUNTER — Telehealth: Payer: Self-pay | Admitting: *Deleted

## 2014-10-12 NOTE — Telephone Encounter (Signed)
  Follow up Call-  Call back number 10/11/2014  Post procedure Call Back phone  # [phone] cell #, home number [phone] leave vm  Permission to leave phone message Yes   mailbox full. No message left

## 2014-10-17 ENCOUNTER — Encounter: Payer: Self-pay | Admitting: Gastroenterology

## 2014-11-04 ENCOUNTER — Ambulatory Visit (INDEPENDENT_AMBULATORY_CARE_PROVIDER_SITE_OTHER): Payer: Medicare Other | Admitting: Family Medicine

## 2014-11-04 ENCOUNTER — Encounter: Payer: Self-pay | Admitting: Family Medicine

## 2014-11-04 VITALS — BP 124/80 | HR 59 | Temp 97.5°F | Ht 71.0 in | Wt 229.0 lb

## 2014-11-04 DIAGNOSIS — M1712 Unilateral primary osteoarthritis, left knee: Secondary | ICD-10-CM

## 2014-11-04 DIAGNOSIS — I10 Essential (primary) hypertension: Secondary | ICD-10-CM | POA: Diagnosis not present

## 2014-11-04 DIAGNOSIS — Z23 Encounter for immunization: Secondary | ICD-10-CM

## 2014-11-04 DIAGNOSIS — Z7189 Other specified counseling: Secondary | ICD-10-CM

## 2014-11-04 DIAGNOSIS — Z0000 Encounter for general adult medical examination without abnormal findings: Secondary | ICD-10-CM | POA: Diagnosis not present

## 2014-11-04 LAB — COMPREHENSIVE METABOLIC PANEL
ALT: 19 U/L (ref 0–53)
AST: 22 U/L (ref 0–37)
Albumin: 4.2 g/dL (ref 3.5–5.2)
Alkaline Phosphatase: 84 U/L (ref 39–117)
BUN: 10 mg/dL (ref 6–23)
CO2: 30 mEq/L (ref 19–32)
Calcium: 9.9 mg/dL (ref 8.4–10.5)
Chloride: 107 mEq/L (ref 96–112)
Creatinine, Ser: 0.95 mg/dL (ref 0.40–1.50)
GFR: 82.28 mL/min (ref >60.00)
Glucose, Bld: 99 mg/dL (ref 70–99)
Potassium: 5.2 mEq/L — ABNORMAL HIGH (ref 3.5–5.1)
Sodium: 140 mEq/L (ref 135–145)
Total Bilirubin: 0.9 mg/dL (ref 0.2–1.2)
Total Protein: 7.1 g/dL (ref 6.0–8.3)

## 2014-11-04 LAB — LIPID PANEL
Cholesterol: 175 mg/dL (ref 0–200)
HDL: 48.4 mg/dL (ref >39.00)
LDL Cholesterol: 105 mg/dL — ABNORMAL HIGH (ref 0–99)
NonHDL: 126.6
Total CHOL/HDL Ratio: 4
Triglycerides: 107 mg/dL (ref 0.0–149.0)
VLDL: 21.4 mg/dL (ref 0.0–40.0)

## 2014-11-04 MED ORDER — VERAPAMIL HCL ER 240 MG PO TBCR: EXTENDED_RELEASE_TABLET | ORAL | Status: DC

## 2014-11-04 NOTE — Progress Notes (Signed)
Pre visit review using our clinic review tool, if applicable. No additional management support is needed unless otherwise documented below in the visit note.  I have personally reviewed the Medicare Annual Wellness questionnaire and have noted 1. The patient's medical and social history 2. Their use of alcohol, tobacco or illicit drugs 3. Their current medications and supplements 4. The patient's functional ability including ADL's, fall risks, home safety risks and hearing or visual             impairment. 5. Diet and physical activities 6. Evidence for depression or mood disorders  The patients weight, height, BMI have been recorded in the chart and visual acuity is per eye clinic.  I have made referrals, counseling and provided education to the patient based review of the above and I have provided the pt with a written personalized care plan for preventive services.  Provider list updated- see scanned forms.  Routine anticipatory guidance given to patient.  See health maintenance.  Flu prev done Shingles prev done PNA d/w pt Tetanus 2010 Colon 2016 Prostate cancer screening- has urology f/u pending.  D/w pt.   Advance directive. Daughter Rodena Piety or [name], to make health care decisions Would accept resuscitation but doesn't want prolonged interventions Cognitive function addressed- see scanned forms- and if abnormal then additional documentation follows.  Wife has some memory changes and he is working with her as she declines.   HTN.  On med, no ADE.  Doing well.  No complaints re: BP med.   L knee prev painful and puffy.  Not ttp now.  Pain resolved now.  No trauma.    PMH and SH reviewed  Meds, vitals, and allergies reviewed.   ROS: See HPI.  Otherwise negative.    GEN: nad, alert and oriented HEENT: mucous membranes moist NECK: supple w/o LA CV: rrr. PULM: ctab, no inc wob ABD: soft, +bs EXT: no edema SKIN: no acute rash L knee with normal ROM, not red or bruised, +  crepitus on ROM but not painful.  Not ttp on the joint line

## 2014-11-04 NOTE — Patient Instructions (Addendum)
I would get a flu shot each fall.   Take care.  Glad to see you.   Go to the lab on the way out.  We'll contact you with your lab report.

## 2014-11-05 DIAGNOSIS — M1710 Unilateral primary osteoarthritis, unspecified knee: Secondary | ICD-10-CM | POA: Insufficient documentation

## 2014-11-05 DIAGNOSIS — M179 Osteoarthritis of knee, unspecified: Secondary | ICD-10-CM | POA: Insufficient documentation

## 2014-11-05 DIAGNOSIS — Z7189 Other specified counseling: Secondary | ICD-10-CM | POA: Insufficient documentation

## 2014-11-05 NOTE — Assessment & Plan Note (Signed)
Flu prev done Shingles prev done PNA d/w pt Tetanus 2010 Colon 2016 Prostate cancer screening- has urology f/u pending.  D/w pt.   Advance directive. Daughter Rodena Piety or [name], to make health care decisions Would accept resuscitation but doesn't want prolonged interventions Cognitive function addressed- see scanned forms- and if abnormal then additional documentation follows.  Wife has some memory changes and he is working with her as she declines.

## 2014-11-05 NOTE — Assessment & Plan Note (Signed)
Likely dx.  Pain resolved now.  No need to intervene or image now. Update me as needed.  He agrees.

## 2014-11-05 NOTE — Assessment & Plan Note (Signed)
Continue as is.  Doing well.  See notes on labs.

## 2014-11-07 ENCOUNTER — Encounter: Payer: Self-pay | Admitting: *Deleted

## 2015-03-29 ENCOUNTER — Encounter: Payer: Self-pay | Admitting: Family Medicine

## 2015-03-29 ENCOUNTER — Ambulatory Visit (INDEPENDENT_AMBULATORY_CARE_PROVIDER_SITE_OTHER): Payer: Medicare Other | Admitting: Family Medicine

## 2015-03-29 VITALS — BP 120/74 | HR 64 | Temp 97.7°F | Wt 224.8 lb

## 2015-03-29 DIAGNOSIS — L989 Disorder of the skin and subcutaneous tissue, unspecified: Secondary | ICD-10-CM | POA: Diagnosis not present

## 2015-03-29 NOTE — Progress Notes (Signed)
Pre visit review using our clinic review tool, if applicable. No additional management support is needed unless otherwise documented below in the visit note.  He saw Dr. Risa Grill yesterday and had a good report on the cystoscopy.  He will have 1 year follow up.    Lesion on the R side of the face.  Had 2 other lesions that are nearly resolved.  Slightly larger yesterday, some better yesterday.  No drainage.  Unclear if it was a seasonal change- it happened last year also.  Less sore today.  No fevers.  Feels well o/w.    Meds, vitals, and allergies reviewed.   ROS: See HPI.  Otherwise, noncontributory.  nad ncat 1cm lesion on the R temple, small minimally raised papule that isn't draining, not ttp .

## 2015-03-29 NOTE — Patient Instructions (Signed)
Try warm compresses for a few days and update me if needed.  Take care.  Glad to see you.

## 2015-03-30 DIAGNOSIS — L989 Disorder of the skin and subcutaneous tissue, unspecified: Secondary | ICD-10-CM | POA: Insufficient documentation

## 2015-03-30 NOTE — Assessment & Plan Note (Signed)
Looks benign, could be a cyst.  Wouldn't need I&D at this point since so small.  He agrees.  Warm compresses in meantime.  Should resolve.  He'll update me as needed.

## 2015-04-15 ENCOUNTER — Encounter (HOSPITAL_COMMUNITY): Payer: Self-pay | Admitting: Emergency Medicine

## 2015-04-15 ENCOUNTER — Emergency Department (HOSPITAL_COMMUNITY): Payer: Medicare Other

## 2015-04-15 ENCOUNTER — Emergency Department (HOSPITAL_COMMUNITY)
Admission: EM | Admit: 2015-04-15 | Discharge: 2015-04-15 | Disposition: A | Discharge status: Home / Self Care | Payer: Medicare Other | Attending: Emergency Medicine | Admitting: Emergency Medicine

## 2015-04-15 DIAGNOSIS — Y998 Other external cause status: Secondary | ICD-10-CM | POA: Diagnosis not present

## 2015-04-15 DIAGNOSIS — X58XXXA Exposure to other specified factors, initial encounter: Secondary | ICD-10-CM | POA: Diagnosis not present

## 2015-04-15 DIAGNOSIS — I10 Essential (primary) hypertension: Secondary | ICD-10-CM | POA: Diagnosis not present

## 2015-04-15 DIAGNOSIS — Z86010 Personal history of colonic polyps: Secondary | ICD-10-CM | POA: Diagnosis not present

## 2015-04-15 DIAGNOSIS — S79911A Unspecified injury of right hip, initial encounter: Secondary | ICD-10-CM | POA: Diagnosis not present

## 2015-04-15 DIAGNOSIS — Z87438 Personal history of other diseases of male genital organs: Secondary | ICD-10-CM | POA: Insufficient documentation

## 2015-04-15 DIAGNOSIS — Z8551 Personal history of malignant neoplasm of bladder: Secondary | ICD-10-CM | POA: Insufficient documentation

## 2015-04-15 DIAGNOSIS — M25559 Pain in unspecified hip: Secondary | ICD-10-CM

## 2015-04-15 DIAGNOSIS — Y9289 Other specified places as the place of occurrence of the external cause: Secondary | ICD-10-CM | POA: Diagnosis not present

## 2015-04-15 DIAGNOSIS — Z79899 Other long term (current) drug therapy: Secondary | ICD-10-CM | POA: Diagnosis not present

## 2015-04-15 DIAGNOSIS — Z87442 Personal history of urinary calculi: Secondary | ICD-10-CM | POA: Insufficient documentation

## 2015-04-15 DIAGNOSIS — M25561 Pain in right knee: Secondary | ICD-10-CM

## 2015-04-15 DIAGNOSIS — Y9301 Activity, walking, marching and hiking: Secondary | ICD-10-CM | POA: Diagnosis not present

## 2015-04-15 DIAGNOSIS — Z87891 Personal history of nicotine dependence: Secondary | ICD-10-CM | POA: Insufficient documentation

## 2015-04-15 DIAGNOSIS — S8991XA Unspecified injury of right lower leg, initial encounter: Secondary | ICD-10-CM | POA: Insufficient documentation

## 2015-04-15 DIAGNOSIS — M1990 Unspecified osteoarthritis, unspecified site: Secondary | ICD-10-CM | POA: Diagnosis not present

## 2015-04-15 MED ORDER — OXYCODONE-ACETAMINOPHEN 5-325 MG PO TABS: 2.0000 | ORAL_TABLET | ORAL | Status: DC | PRN

## 2015-04-15 MED ORDER — FENTANYL CITRATE (PF) 100 MCG/2ML IJ SOLN: 50.0000 ug | Freq: Once | INTRAMUSCULAR | Status: DC

## 2015-04-15 MED ORDER — FENTANYL CITRATE (PF) 100 MCG/2ML IJ SOLN
50.0000 ug | Freq: Once | INTRAMUSCULAR | Status: AC
  Administered 2015-04-15: 50 ug via NASAL
  Filled 2015-04-15: qty 2

## 2015-04-15 NOTE — ED Notes (Signed)
Pt from home c/o right knee pain x 1 week. He reports that the pain shoot to his hip. He also reports that he heard a pop. Pt has difficulty waling with the pain. Denies injury.

## 2015-04-15 NOTE — ED Provider Notes (Signed)
CSN: [id number]     Arrival date & time 04/15/15  1858 History   First MD Initiated Contact with Patient 04/15/15 1949     Chief Complaint  Patient presents with  . Knee Pain  . Hip Pain     (Consider location/radiation/quality/duration/timing/severity/associated sxs/prior Treatment) HPI   Richard King is a 74 y.o M who presents to the emergency Department complaining of right knee pain. Patient states he has been having pain in his right knee for over a week. He has been ambulatory up until today when he was walking and he heard a "pop" in his right knee and now he is unable to bear weight on that extremity. Patient states he has pain radiate up into his right hip. Denies trauma or injury. Denies regular trace swelling, discoloration, numbness, tingling, paresthesias, weakness. Patient is not taking anything for the pain.  Past Medical History  Diagnosis Date  . Hypertension   . History of nephrolithiasis   . Hx of colonic polyps   . BPH (benign prostatic hypertrophy)   . ED (erectile dysfunction)   . Bladder cancer ([place])   . Arthritis    Past Surgical History  Procedure Laterality Date  . Lithotripsy      per Dr. Risa Grill  . 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 Cataract extraction    . Bladder cancer shaving      hx bladder cancer  . Colonoscopy    . Polypectomy  2009    HPP   Family History  Problem Relation Age of Onset  . Hypertension Father   . Cancer Neg Hx   . Diabetes Neg Hx   . Colon cancer Neg Hx   . Prostate cancer Neg Hx    Social History  Substance Use Topics  . Smoking status: Former Smoker    Quit date: 06/03/1976  . Smokeless tobacco: Never Used  . Alcohol Use: No    Review of Systems  All other systems reviewed and are negative.     Allergies  Ivp dye and Codeine  Home Medications   Prior to Admission medications   Medication Sig Start Date End Date Taking? Authorizing Provider    Aspirin-Acetaminophen-Caffeine (GOODY HEADACHE PO) Take 1 each by mouth every 8 (eight) hours as needed (pain).   Yes Historical Provider, MD  cyclobenzaprine (FLEXERIL) 10 MG tablet Take 10 mg by mouth once.   Yes Historical Provider, MD  verapamil (CALAN-SR) 240 MG CR tablet TAKE 1 TABLET BY MOUTH AT BEDTIME. Patient taking differently: Take 240 mg by mouth at bedtime.  11/04/14  Yes Tonia Ghent, MD  oxyCODONE-acetaminophen (PERCOCET/ROXICET) 5-325 MG tablet Take 2 tablets by mouth every 4 (four) hours as needed for severe pain. 04/15/15   Rye Decoste Tripp Tyrea Froberg, PA-C   BP 155/90 mmHg  Pulse 77  Temp(Src) 98.1 F (36.7 C) (Oral)  Resp 20  SpO2 92% Physical Exam  Constitutional: He is oriented to person, place, and time. He appears well-developed and well-nourished. No distress.  HENT:  Head: Normocephalic and atraumatic.  Mouth/Throat: No oropharyngeal exudate.  Eyes: Conjunctivae and EOM are normal. Pupils are equal, round, and reactive to light. Right eye exhibits no discharge. Left eye exhibits no discharge. No scleral icterus.  Cardiovascular: Normal rate, regular rhythm, normal heart sounds and intact distal pulses.  Exam reveals no gallop and no friction rub.   No murmur heard. Pulmonary/Chest: Effort normal and breath sounds normal. No respiratory distress.  He has no wheezes. He has no rales. He exhibits no tenderness.  Abdominal: Soft. He exhibits no distension. There is no tenderness. There is no guarding.  Musculoskeletal: Normal range of motion. He exhibits no edema.       Right knee: He exhibits effusion. He exhibits no deformity and no erythema.       Legs: No decrease ROM of R hip. No TTP of hip. No obvious bony deformity.   Negative anterior/poster drawer bilaterally. Negative ballottement test. No varus or valgus laxity. No crepitus. Knee pain felt with flexion of R knee. Pain is primarily posterior knee. NO obvious bony deformity. No redness. Not warm to touch.    Neurological: He is alert and oriented to person, place, and time.  Skin: Skin is warm and dry. No rash noted. He is not diaphoretic. No erythema. No pallor.  Psychiatric: He has a normal mood and affect. His behavior is normal.  Nursing note and vitals reviewed.   ED Course  Procedures (including critical care time) Labs Review Labs Reviewed - No data to display  Imaging Review Dg Knee Complete 4 Views Right  04/15/2015  CLINICAL DATA:  Acute onset of right knee pain for 1 week. Initial encounter. EXAM: RIGHT KNEE - COMPLETE 4+ VIEW COMPARISON:  None. FINDINGS: There is no evidence of fracture or dislocation. The joint spaces are preserved. There is slight cortical irregularity at the articular surface of the patella. An enthesophyte is seen arising at the upper pole of the patella. A small knee joint effusion is noted. The visualized soft tissues are otherwise unremarkable in appearance. IMPRESSION: 1. No evidence of fracture or dislocation. 2. Small knee joint effusion noted. 3. Minimal degenerative change at the patellofemoral compartment. Electronically Signed   By: Garald Balding M.D.   On: 04/15/2015 20:22   Dg Hip Unilat With Pelvis 2-3 Views Right  04/15/2015  CLINICAL DATA:  Acute onset of right hip pain and right knee pain for 1 week. Initial encounter. EXAM: DG HIP (WITH OR WITHOUT PELVIS) 2-3V RIGHT COMPARISON:  None. FINDINGS: There is no evidence of fracture or dislocation. Both femoral heads are seated normally within their respective acetabula. The proximal right femur appears intact. Mild sclerotic change is noted at the sacroiliac joints. Mild degenerative change is noted at the lower lumbar spine. The visualized bowel gas pattern is grossly unremarkable in appearance. IMPRESSION: No evidence of fracture or dislocation. Electronically Signed   By: Garald Balding M.D.   On: 04/15/2015 20:23   I have personally reviewed and evaluated these images and lab results as part of my  medical decision-making.   EKG Interpretation None      MDM   Final diagnoses:  Hip pain  Knee pain, acute, right    74 y.o M presents with R knee pain onset 1 week ago. Patient has been having posterior knee pain for one week but has been able to ambulate. However today patient stood up and heard a pop in his right knee and has not been able to ambulate or bear weight on that right leg since due to increased pain. Pain in knee is increased with flexion. Knee is not erythematous or swollen. Patient is afebrile. Do not suspect septic joint.  X-rays negative for fracture. Suspect probable ligamentous injury. We'll need outpatient MRI and orthopedic follow-up. Will place patient in knee immobilizer. Discussed treatment plan with pt who is agreeable.   Will d/c home on pain medication. Pt states that his reported allergy  to percocet is hyperactivity. NOt a true allergy. Pt safe to take percocet at home. Pt will follow up with ortho.     Dondra Spry [place], PA-C 04/16/15 1621  Sherwood Gambler, MD 04/19/15 0900

## 2015-04-15 NOTE — Discharge Instructions (Signed)
Cryotherapy °Cryotherapy means treatment with cold. Ice or gel packs can be used to reduce both pain and swelling. Ice is the most helpful within the first 24 to 48 hours after an injury or flare-up from overusing a muscle or joint. Sprains, strains, spasms, burning pain, shooting pain, and aches can all be eased with ice. Ice can also be used when recovering from surgery. Ice is effective, has very few side effects, and is safe for most people to use. °PRECAUTIONS  °Ice is not a safe treatment option for people with: °· Raynaud phenomenon. This is a condition affecting small blood vessels in the extremities. Exposure to cold may cause your problems to return. °· Cold hypersensitivity. There are many forms of cold hypersensitivity, including: °· Cold urticaria. Red, itchy hives appear on the skin when the tissues begin to warm after being iced. °· Cold erythema. This is a red, itchy rash caused by exposure to cold. °· Cold hemoglobinuria. Red blood cells break down when the tissues begin to warm after being iced. The hemoglobin that carry oxygen are passed into the urine because they cannot combine with blood proteins fast enough. °· Numbness or altered sensitivity in the area being iced. °If you have any of the following conditions, do not use ice until you have discussed cryotherapy with your caregiver: °· Heart conditions, such as arrhythmia, angina, or chronic heart disease. °· High blood pressure. °· Healing wounds or open skin in the area being iced. °· Current infections. °· Rheumatoid arthritis. °· Poor circulation. °· Diabetes. °Ice slows the blood flow in the region it is applied. This is beneficial when trying to stop inflamed tissues from spreading irritating chemicals to surrounding tissues. However, if you expose your skin to cold temperatures for too long or without the proper protection, you can damage your skin or nerves. Watch for signs of skin damage due to cold. °HOME CARE INSTRUCTIONS °Follow  these tips to use ice and cold packs safely. °· Place a dry or damp towel between the ice and skin. A damp towel will cool the skin more quickly, so you may need to shorten the time that the ice is used. °· For a more rapid response, add gentle compression to the ice. °· Ice for no more than 10 to 20 minutes at a time. The bonier the area you are icing, the less time it will take to get the benefits of ice. °· Check your skin after 5 minutes to make sure there are no signs of a poor response to cold or skin damage. °· Rest 20 minutes or more between uses. °· Once your skin is numb, you can end your treatment. You can test numbness by very lightly touching your skin. The touch should be so light that you do not see the skin dimple from the pressure of your fingertip. When using ice, most people will feel these normal sensations in this order: cold, burning, aching, and numbness. °· Do not use ice on someone who cannot communicate their responses to pain, such as small children or people with dementia. °HOW TO MAKE AN ICE PACK °Ice packs are the most common way to use ice therapy. Other methods include ice massage, ice baths, and cryosprays. Muscle creams that cause a cold, tingly feeling do not offer the same benefits that ice offers and should not be used as a substitute unless recommended by your caregiver. °To make an ice pack, do one of the following: °· Place crushed ice or a   bag of frozen vegetables in a sealable plastic bag. Squeeze out the excess air. Place this bag inside another plastic bag. Slide the bag into a pillowcase or place a damp towel between your skin and the bag.  Mix 3 parts water with 1 part rubbing alcohol. Freeze the mixture in a sealable plastic bag. When you remove the mixture from the freezer, it will be slushy. Squeeze out the excess air. Place this bag inside another plastic bag. Slide the bag into a pillowcase or place a damp towel between your skin and the bag. SEEK MEDICAL CARE  IF:  You develop white spots on your skin. This may give the skin a blotchy (mottled) appearance.  Your skin turns blue or pale.  Your skin becomes waxy or hard.  Your swelling gets worse. MAKE SURE YOU:   Understand these instructions.  Will watch your condition.  Will get help right away if you are not doing well or get worse.   This information is not intended to replace advice given to you by your health care provider. Make sure you discuss any questions you have with your health care provider.   Document Released: 01/14/2011 Document Revised: 06/10/2014 Document Reviewed: 01/14/2011 Elsevier Interactive Patient Education 2016 [employer]  Hip Pain Your hip is the joint between your upper legs and your lower pelvis. The bones, cartilage, tendons, and muscles of your hip joint perform a lot of work each day supporting your body weight and allowing you to move around. Hip pain can range from a minor ache to severe pain in one or both of your hips. Pain may be felt on the inside of the hip joint near the groin, or the outside near the buttocks and upper thigh. You may have swelling or stiffness as well.  HOME CARE INSTRUCTIONS   Take medicines only as directed by your health care provider.  Apply ice to the injured area:  Put ice in a plastic bag.  Place a towel between your skin and the bag.  Leave the ice on for 15-20 minutes at a time, 3-4 times a day.  Keep your leg raised (elevated) when possible to lessen swelling.  Avoid activities that cause pain.  Follow specific exercises as directed by your health care provider.  Sleep with a pillow between your legs on your most comfortable side.  Record how often you have hip pain, the location of the pain, and what it feels like. SEEK MEDICAL CARE IF:   You are unable to put weight on your leg.  Your hip is red or swollen or very tender to touch.  Your pain or swelling continues or worsens after 1 week.  You  have increasing difficulty walking.  You have a fever. SEEK IMMEDIATE MEDICAL CARE IF:   You have fallen.  You have a sudden increase in pain and swelling in your hip. MAKE SURE YOU:   Understand these instructions.  Will watch your condition.  Will get help right away if you are not doing well or get worse.   This information is not intended to replace advice given to you by your health care provider. Make sure you discuss any questions you have with your health care provider.   Document Released: 11/07/2009 Document Revised: 06/10/2014 Document Reviewed: 01/14/2013 Elsevier Interactive Patient Education 2016 [employer].  Joint Pain Joint pain can be caused by many things. The joint can be bruised, infected, weak from aging, or sore from exercise. The pain will probably go  away if you follow your doctor's instructions for home care. If your joint pain continues, more tests may be needed to help find the cause of your condition. HOME CARE Watch your condition for any changes. Follow these instructions as told to lessen the pain that you are feeling:  Take medicines only as told by your doctor.  Rest the sore joint for as long as told by your doctor. If your doctor tells you to, raise (elevate) the painful joint above the level of your heart while you are sitting or lying down.  Do not do things that cause pain or make the pain worse.  If told, put ice on the painful area:  Put ice in a plastic bag.  Place a towel between your skin and the bag.  Leave the ice on for 20 minutes, 2-3 times per day.  Wear an elastic bandage, splint, or sling as told by your doctor. Loosen the bandage or splint if your fingers or toes lose feeling (become numb) and tingle, or if they turn cold and blue.  Begin exercising or stretching the joint as told by your doctor. Ask your doctor what types of exercise are safe for you.  Keep all follow-up visits as told by your doctor. This is  important. GET HELP IF:  Your pain gets worse and medicine does not help it.  Your joint pain does not get better in 3 days.  You have more bruising or swelling.  You have a fever.  You lose 10 pounds (4.5 kg) or more without trying. GET HELP RIGHT AWAY IF:  You are not able to move the joint.  Your fingers or toes become numb or they turn cold and blue.   This information is not intended to replace advice given to you by your health care provider. Make sure you discuss any questions you have with your health care provider.   Document Released: 05/08/2009 Document Revised: 06/10/2014 Document Reviewed: 03/01/2014 Elsevier Interactive Patient Education 2016 [employer]  Knee Pain Knee pain is a very common symptom and can have many causes. Knee pain often goes away when you follow your health care provider's instructions for relieving pain and discomfort at home. However, knee pain can develop into a condition that needs treatment. Some conditions may include:  Arthritis caused by wear and tear (osteoarthritis).  Arthritis caused by swelling and irritation (rheumatoid arthritis or gout).  A cyst or growth in your knee.  An infection in your knee joint.  An injury that will not heal.  Damage, swelling, or irritation of the tissues that support your knee (torn ligaments or tendinitis). If your knee pain continues, additional tests may be ordered to diagnose your condition. Tests may include X-rays or other imaging studies of your knee. You may also need to have fluid removed from your knee. Treatment for ongoing knee pain depends on the cause, but treatment may include:  Medicines to relieve pain or swelling.  Steroid injections in your knee.  Physical therapy.  Surgery. HOME CARE INSTRUCTIONS  Take medicines only as directed by your health care provider.  Rest your knee and keep it raised (elevated) while you are resting.  Do not do things that cause or worsen  pain.  Avoid high-impact activities or exercises, such as running, jumping rope, or doing jumping jacks.  Apply ice to the knee area:  Put ice in a plastic bag.  Place a towel between your skin and the bag.  Leave the ice on for  20 minutes, 2-3 times a day.  Ask your health care provider if you should wear an elastic knee support.  Keep a pillow under your knee when you sleep.  Lose weight if you are overweight. Extra weight can put pressure on your knee.  Do not use any tobacco products, including cigarettes, chewing tobacco, or electronic cigarettes. If you need help quitting, ask your health care provider. Smoking may slow the healing of any bone and joint problems that you may have. SEEK MEDICAL CARE IF:  Your knee pain continues, changes, or gets worse.  You have a fever along with knee pain.  Your knee buckles or locks up.  Your knee becomes more swollen. SEEK IMMEDIATE MEDICAL CARE IF:   Your knee joint feels hot to the touch.  You have chest pain or trouble breathing.   This information is not intended to replace advice given to you by your health care provider. Make sure you discuss any questions you have with your health care provider.   Follow-up with orthopedist or PCP as soon as possible for reevaluation of knee. May require MRI for additional imaging to determine official diagnosis. Apply ice to affected area. Keep leg elevated at night. Take Percocet as needed for pain. Return to the emergency department if you experience discoloration of your extremity, numbness or chilling in your extremity, chest pain, shortness of breath, right lower extremity swelling.

## 2015-05-18 ENCOUNTER — Encounter: Payer: Self-pay | Admitting: Family Medicine

## 2015-05-18 ENCOUNTER — Ambulatory Visit (INDEPENDENT_AMBULATORY_CARE_PROVIDER_SITE_OTHER): Payer: Medicare Other | Admitting: Family Medicine

## 2015-05-18 VITALS — BP 122/72 | HR 64 | Temp 97.5°F | Wt 225.0 lb

## 2015-05-18 DIAGNOSIS — M25561 Pain in right knee: Secondary | ICD-10-CM | POA: Diagnosis not present

## 2015-05-18 DIAGNOSIS — Z23 Encounter for immunization: Secondary | ICD-10-CM

## 2015-05-18 DIAGNOSIS — R319 Hematuria, unspecified: Secondary | ICD-10-CM

## 2015-05-18 DIAGNOSIS — R0981 Nasal congestion: Secondary | ICD-10-CM

## 2015-05-18 DIAGNOSIS — M25569 Pain in unspecified knee: Secondary | ICD-10-CM | POA: Insufficient documentation

## 2015-05-18 DIAGNOSIS — I861 Scrotal varices: Secondary | ICD-10-CM | POA: Insufficient documentation

## 2015-05-18 LAB — POCT URINALYSIS DIPSTICK
Bilirubin, UA: NEGATIVE
Blood, UA: NEGATIVE
Glucose, UA: NEGATIVE
Ketones, UA: NEGATIVE
Leukocytes, UA: NEGATIVE
Nitrite, UA: NEGATIVE
Protein, UA: NEGATIVE
Spec Grav, UA: >=1.03
Urobilinogen, UA: 4
pH, UA: 6

## 2015-05-18 NOTE — Progress Notes (Signed)
Pre visit review using our clinic review tool, if applicable. No additional management support is needed unless otherwise documented below in the visit note.  Saw blood on towel yesterday after a shower twice.  Didn't see where it was coming from initially.  He has spot bleeding on the outside at the base of the shaft of penis.  No dysuria.  No FCNAVD.  No abd pain.  He never saw any blood in urine.  H/o bladder cancer and former smoker, d/w pt.    Some post nasal gtt.  Not congested.  Started a few days ago.  Some mild irritation in the throat in the AM, but better as the day goes on.    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  Had fluid drawn off.  Some days are better than others.  He was asking about a parking decal.  This is reasonable. Was prev injected.  He f/u with them if needed.   Meds, vitals, and allergies reviewed.   ROS: See HPI.  Otherwise, noncontributory.  nad ncat Nasal exam slightly stuffy OP wnl TM wnl B Testes bilaterally descended without nodularity, tenderness or masses. No scrotal masses or lesions except for scrotal varices noted- that was the site of the bleeding. No penis lesions or urethral discharge.

## 2015-05-18 NOTE — Assessment & Plan Note (Signed)
Benign, not related to bladder hx, benign urine.  F/u prn.  Reassured.

## 2015-05-18 NOTE — Assessment & Plan Note (Signed)
F/u with ortho.  I did his parking form for [facility]

## 2015-05-18 NOTE — Assessment & Plan Note (Signed)
Reasonable for OTC tx.  Nontoxic.  Mild sx.

## 2015-05-18 NOTE — Patient Instructions (Signed)
Take care.  Glad to see you. Update me as needed.  

## 2015-11-06 ENCOUNTER — Other Ambulatory Visit: Payer: Self-pay | Admitting: Family Medicine

## 2015-11-06 DIAGNOSIS — I10 Essential (primary) hypertension: Secondary | ICD-10-CM

## 2015-11-07 ENCOUNTER — Other Ambulatory Visit: Payer: Self-pay | Admitting: Family Medicine

## 2015-11-07 ENCOUNTER — Ambulatory Visit (INDEPENDENT_AMBULATORY_CARE_PROVIDER_SITE_OTHER): Payer: Medicare Other

## 2015-11-07 ENCOUNTER — Other Ambulatory Visit (INDEPENDENT_AMBULATORY_CARE_PROVIDER_SITE_OTHER): Payer: Medicare Other

## 2015-11-07 VITALS — BP 126/80 | HR 63 | Temp 97.7°F | Ht 70.5 in | Wt 237.0 lb

## 2015-11-07 DIAGNOSIS — Z0000 Encounter for general adult medical examination without abnormal findings: Secondary | ICD-10-CM

## 2015-11-07 DIAGNOSIS — I10 Essential (primary) hypertension: Secondary | ICD-10-CM | POA: Diagnosis not present

## 2015-11-07 LAB — COMPREHENSIVE METABOLIC PANEL
ALT: 19 U/L (ref 0–53)
AST: 20 U/L (ref 0–37)
Albumin: 4.1 g/dL (ref 3.5–5.2)
Alkaline Phosphatase: 81 U/L (ref 39–117)
BUN: 10 mg/dL (ref 6–23)
CO2: 30 mEq/L (ref 19–32)
Calcium: 9.6 mg/dL (ref 8.4–10.5)
Chloride: 105 mEq/L (ref 96–112)
Creatinine, Ser: 0.91 mg/dL (ref 0.40–1.50)
GFR: 86.23 mL/min (ref >60.00)
Glucose, Bld: 153 mg/dL — ABNORMAL HIGH (ref 70–99)
Potassium: 4.7 mEq/L (ref 3.5–5.1)
Sodium: 139 mEq/L (ref 135–145)
Total Bilirubin: 0.6 mg/dL (ref 0.2–1.2)
Total Protein: 6.9 g/dL (ref 6.0–8.3)

## 2015-11-07 LAB — LIPID PANEL
Cholesterol: 160 mg/dL (ref 0–200)
HDL: 40.6 mg/dL (ref >39.00)
LDL Cholesterol: 84 mg/dL (ref 0–99)
NonHDL: 119.65
Total CHOL/HDL Ratio: 4
Triglycerides: 180 mg/dL — ABNORMAL HIGH (ref 0.0–149.0)
VLDL: 36 mg/dL (ref 0.0–40.0)

## 2015-11-07 MED ORDER — VERAPAMIL HCL ER 240 MG PO TBCR: 240.0000 mg | EXTENDED_RELEASE_TABLET | Freq: Every day | ORAL | Status: DC

## 2015-11-07 NOTE — Patient Instructions (Signed)
[name] , Thank you for taking time to come for your Medicare Wellness Visit. I appreciate your ongoing commitment to your health goals. Please review the following plan we discussed and let me know if I can assist you in the future.    This is a list of the screening recommended for you and due dates:  Health Maintenance  Topic Date Due  . Flu Shot  01/02/2016  . Tetanus Vaccine  09/08/2018  . Colon Cancer Screening  10/11/2019  . Shingles Vaccine  Addressed  . Pneumonia vaccines  Completed    Preventive Care for Adults  A healthy lifestyle and preventive care can promote health and wellness. Preventive health guidelines for adults include the following key practices.  . A routine yearly physical is a good way to check with your health care provider about your health and preventive screening. It is a chance to share any concerns and updates on your health and to receive a thorough exam.  . Visit your dentist for a routine exam and preventive care every 6 months. Brush your teeth twice a day and floss once a day. Good oral hygiene prevents tooth decay and gum disease.  . The frequency of eye exams is based on your age, health, family medical history, use  of contact lenses, and other factors. Follow your health care provider's ecommendations for frequency of eye exams.  . Eat a healthy diet. Foods like vegetables, fruits, whole grains, low-fat dairy products, and lean protein foods contain the nutrients you need without too many calories. Decrease your intake of foods high in solid fats, added sugars, and salt. Eat the right amount of calories for you. Get information about a proper diet from your health care provider, if necessary.  . Regular physical exercise is one of the most important things you can do for your health. Most adults should get at least 150 minutes of moderate-intensity exercise (any activity that increases your heart rate and causes you to sweat) each week. In  addition, most adults need muscle-strengthening exercises on 2 or more days a week.  Silver Sneakers may be a benefit available to you. To determine eligibility, you may visit the website: www.silversneakers.com or contact program at [phone] Mon-Fri between 8AM-8PM.   . Maintain a healthy weight. The body mass index (BMI) is a screening tool to identify possible weight problems. It provides an estimate of body fat based on height and weight. Your health care provider can find your BMI and can help you achieve or maintain a healthy weight.   For adults 20 years and older: ? A BMI below 18.5 is considered underweight. ? A BMI of 18.5 to 24.9 is normal. ? A BMI of 25 to 29.9 is considered overweight. ? A BMI of 30 and above is considered obese.   . Maintain normal blood lipids and cholesterol levels by exercising and minimizing your intake of saturated fat. Eat a balanced diet with plenty of fruit and vegetables. Blood tests for lipids and cholesterol should begin at age 70 and be repeated every 5 years. If your lipid or cholesterol levels are high, you are over 50, or you are at high risk for heart disease, you may need your cholesterol levels checked more frequently. Ongoing high lipid and cholesterol levels should be treated with medicines if diet and exercise are not working.  . If you smoke, find out from your health care provider how to quit. If you do not use tobacco, please do not start.  Marland Kitchen  If you choose to drink alcohol, please do not consume more than 2 drinks per day. One drink is considered to be 12 ounces (355 mL) of beer, 5 ounces (148 mL) of wine, or 1.5 ounces (44 mL) of liquor.  . If you are 59-42 years old, ask your health care provider if you should take aspirin to prevent strokes.  . Use sunscreen. Apply sunscreen liberally and repeatedly throughout the day. You should seek shade when your shadow is shorter than you. Protect yourself by wearing long sleeves, pants, a  wide-brimmed hat, and sunglasses year round, whenever you are outdoors.  . Once a month, do a whole body skin exam, using a mirror to look at the skin on your back. Tell your health care provider of new moles, moles that have irregular borders, moles that are larger than a pencil eraser, or moles that have changed in shape or color.

## 2015-11-07 NOTE — Progress Notes (Signed)
Subjective:   Richard King is a 75 y.o. male who presents for Medicare Annual/Subsequent preventive examination.  Review of Systems:  N/A Cardiac Risk Factors include: advanced age (>43men, >28 women);obesity (BMI >30kg/m2);sedentary lifestyle;male gender;hypertension     Objective:    Vitals: BP 126/80 mmHg  Pulse 63  Temp(Src) 97.7 F (36.5 C) (Oral)  Ht 5' 10.5" (1.791 m)  Wt 237 lb (107.502 kg)  BMI 33.51 kg/m2  SpO2 96%  Body mass index is 33.51 kg/(m^2).  Tobacco History  Smoking status  . Former Smoker  . Quit date: 06/03/1976  Smokeless tobacco  . Never Used     Counseling given: No   Past Medical History  Diagnosis Date  . Hypertension   . History of nephrolithiasis   . Hx of colonic polyps   . BPH (benign prostatic hypertrophy)   . ED (erectile dysfunction)   . Bladder cancer ([place])   . Arthritis    Past Surgical History  Procedure Laterality Date  . Lithotripsy      per Dr. Risa Grill  . 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 Cataract extraction    . Bladder cancer shaving      hx bladder cancer  . Colonoscopy    . Polypectomy  2009    HPP   Family History  Problem Relation Age of Onset  . Hypertension Father   . Cancer Neg Hx   . Diabetes Neg Hx   . Colon cancer Neg Hx   . Prostate cancer Neg Hx    History  Sexual Activity  . Sexual Activity: Yes    Outpatient Encounter Prescriptions as of 11/07/2015  Medication Sig  . Aspirin-Acetaminophen-Caffeine (GOODY HEADACHE PO) Take 1 each by mouth every 8 (eight) hours as needed (pain).  . verapamil (CALAN-SR) 240 MG CR tablet Take 1 tablet (240 mg total) by mouth at bedtime.  . [DISCONTINUED] verapamil (CALAN-SR) 240 MG CR tablet TAKE 1 TABLET BY MOUTH AT BEDTIME. (Patient taking differently: Take 240 mg by mouth at bedtime. )   No facility-administered encounter medications on file as of 11/07/2015.    Activities of Daily Living In your  present state of health, do you have any difficulty performing the following activities: 11/07/2015  Hearing? Y  Vision? N  Difficulty concentrating or making decisions? N  Walking or climbing stairs? N  Dressing or bathing? N  Doing errands, shopping? N  Preparing Food and eating ? N  Using the Toilet? N  In the past six months, have you accidently leaked urine? N  Do you have problems with loss of bowel control? N  Managing your Medications? N  Managing your Finances? N  Housekeeping or managing your Housekeeping? N    Patient Care Team: Tonia Ghent, MD as PCP - General (Family Medicine) Rana Snare, MD as Consulting Physician (Urology) Sharyne Peach, MD as Consulting Physician (Ophthalmology)   Assessment:     Hearing Screening   125Hz  250Hz  500Hz  1000Hz  2000Hz  4000Hz  8000Hz   Right ear:   0 0 0 0   Left ear:   0 0 0 0   Vision Screening Comments: Last eye exam in Oct 2016   Exercise Activities and Dietary recommendations Current Exercise Habits: The patient does not participate in regular exercise at present, Exercise limited by: None identified  Goals    . None     Pt stated he did not have any wellness goals at this  time.       Fall Risk Fall Risk  11/07/2015 11/04/2014 10/21/2013 05/22/2012  Falls in the past year? No No No No   Depression Screen PHQ 2/9 Scores 11/07/2015 11/04/2014 10/21/2013 05/22/2012  PHQ - 2 Score 0 1 0 0    Cognitive Testing MMSE - Mini Mental State Exam 11/07/2015  Orientation to time 5  Orientation to Place 5  Registration 3  Attention/ Calculation 0  Recall 3  Language- name 2 objects 0  Language- repeat 1  Language- follow 3 step command 3  Language- read & follow direction 0  Write a sentence 0  Copy design 0  Total score 20   PLEASE NOTE: A Mini-Cog screen was completed. Maximum score is 20. A value of 0 denotes this part of Folstein MMSE was not completed or the patient failed this part of the Mini-Cog screening.   Mini-Cog  Screening Orientation to Time - Max 5 pts Orientation to Place - Max 5 pts Registration - Max 3 pts Recall - Max 3 pts Language Repeat - Max 1 pts Language Follow 3 Step Command - Max 3 pts   Immunization History  Administered Date(s) Administered  . Influenza Split 05/16/2011, 04/17/2012  . Influenza,inj,Quad PF,36+ Mos 02/25/2013, 05/18/2015  . Pneumococcal Conjugate-13 11/04/2014  . Pneumococcal Polysaccharide-23 09/07/2008  . Td 09/07/2008   Screening Tests Health Maintenance  Topic Date Due  . INFLUENZA VACCINE  01/02/2016  . TETANUS/TDAP  09/08/2018  . COLONOSCOPY  10/11/2019  . ZOSTAVAX  Addressed  . PNA vac Low Risk Adult  Completed      Plan:     I have personally reviewed and addressed the Medicare Annual Wellness questionnaire and have noted the following in the patient's chart:  A. Medical and social history B. Use of alcohol, tobacco or illicit drugs  C. Current medications and supplements D. Functional ability and status E.  Nutritional status F.  Physical activity G. Advance directives H. List of other physicians I.  Hospitalizations, surgeries, and ER visits in previous 12 months J.  [place] to include hearing, vision, cognitive, depression L. Referrals and appointments - none  In addition, I have reviewed and discussed with patient certain preventive protocols, quality metrics, and best practice recommendations. A written personalized care plan for preventive services as well as general preventive health recommendations were provided to patient.  See attached scanned questionnaire for additional information.   Signed,   Lindell Noe, MHA, BS, LPN Health Advisor

## 2015-11-07 NOTE — Progress Notes (Signed)
PCP notes:  Health maintenance: No gaps identified or addressed.  Abnormal screenings: Hearing - failed.  Patient concerns: Needed refill of BP medication. PCP notified. Issue resolved.  Nurse concerns: None  Next PCP appt: 11/13/15@ 0945

## 2015-11-07 NOTE — Progress Notes (Signed)
Has f/u pending.  rx sent in meantime.

## 2015-11-07 NOTE — Progress Notes (Signed)
Pre visit review using our clinic review tool, if applicable. No additional management support is needed unless otherwise documented below in the visit note.  I reviewed health advisor's note, was available for consultation on the day of service listed in this note, and agree with documentation and plan. Graham Duncan, MD.  

## 2015-11-13 ENCOUNTER — Encounter: Payer: Self-pay | Admitting: Family Medicine

## 2015-11-13 ENCOUNTER — Ambulatory Visit (INDEPENDENT_AMBULATORY_CARE_PROVIDER_SITE_OTHER): Payer: Medicare Other | Admitting: Family Medicine

## 2015-11-13 VITALS — BP 132/74 | HR 57 | Temp 97.4°F | Ht 71.0 in | Wt 234.2 lb

## 2015-11-13 DIAGNOSIS — I10 Essential (primary) hypertension: Secondary | ICD-10-CM | POA: Diagnosis not present

## 2015-11-13 DIAGNOSIS — Z638 Other specified problems related to primary support group: Secondary | ICD-10-CM | POA: Insufficient documentation

## 2015-11-13 DIAGNOSIS — Z636 Dependent relative needing care at home: Secondary | ICD-10-CM

## 2015-11-13 NOTE — Patient Instructions (Addendum)
I'm glad we talked about falling/risk reduction today.   Don't change your med for now.  Take care.  Glad to see you.

## 2015-11-13 NOTE — Assessment & Plan Note (Addendum)
Controlled, continue as is.  Labs d/w pt.  Sugar was up but had eaten just before labs.  Therefore, labs okay.   No change in meds.  >25 minutes spent in face to face time with patient, >50% spent in counselling or coordination of care.

## 2015-11-13 NOTE — Progress Notes (Signed)
Pre visit review using our clinic review tool, if applicable. No additional management support is needed unless otherwise documented below in the visit note.  No falls, d/w pt about fall risk reduction and home exercises.  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    Hypertension:    Using medication without problems or lightheadedness: yes Chest pain with exertion:no Edema: some occ BLE edema, usually R>L leg.  Goes down by AM.  Worse in summer.   Short of breath:no Labs d/w pt.  Sugar was up but had eaten just before labs.  Therefore, labs okay.    His wife has dementia and he is still caring for her and "able to get by."    Advance directive d/w pt.  Daughter Rodena Piety or [name], to make health care decisions  ROS: Per HPI unless specifically indicated in ROS section   Meds, vitals, and allergies reviewed.   GEN: nad, alert and oriented HEENT: mucous membranes moist NECK: supple w/o LA CV: rrr.  no murmur PULM: ctab, no inc wob ABD: soft, +bs EXT: trace edema

## 2016-07-29 ENCOUNTER — Ambulatory Visit: Payer: Medicare Other | Admitting: Primary Care

## 2016-11-04 ENCOUNTER — Other Ambulatory Visit: Payer: Self-pay | Admitting: Family Medicine

## 2016-11-05 NOTE — Telephone Encounter (Signed)
Electronic refill request. Last office visit:   11/13/15 Last Filled:    90 tablet 3 11/07/2015  Please advise.

## 2017-02-07 ENCOUNTER — Telehealth: Payer: Self-pay | Admitting: Family Medicine

## 2017-02-07 NOTE — Telephone Encounter (Signed)
Called pt. No answer. No VM  Pt due for AWV + labs with Katha Cabal and CPE with PCP  NOTE* Last AWV 11/07/15

## 2017-03-12 NOTE — Telephone Encounter (Signed)
Left pt message asking to call Ebony Hail back directly at [phone] to schedule AWV + labs with Katha Cabal and CPE with PCP.   NOTE* Last AWV 11/07/15

## 2017-03-17 ENCOUNTER — Other Ambulatory Visit: Payer: Self-pay | Admitting: Family Medicine

## 2017-03-17 MED ORDER — VERAPAMIL HCL ER 240 MG PO TBCR: 240.0000 mg | EXTENDED_RELEASE_TABLET | Freq: Every day | ORAL | 1 refills | Status: DC

## 2017-03-17 NOTE — Progress Notes (Signed)
rx done, given to patient, advised to schedule AMW visit.  He agrees.  He plans to come in yearly 2019.

## 2017-06-23 ENCOUNTER — Ambulatory Visit (INDEPENDENT_AMBULATORY_CARE_PROVIDER_SITE_OTHER): Payer: Medicare Other | Admitting: Family Medicine

## 2017-06-23 ENCOUNTER — Encounter: Payer: Self-pay | Admitting: Family Medicine

## 2017-06-23 ENCOUNTER — Encounter: Payer: Medicare Other | Admitting: Family Medicine

## 2017-06-23 VITALS — BP 144/80 | HR 61 | Temp 97.8°F | Ht 71.0 in | Wt 233.0 lb

## 2017-06-23 DIAGNOSIS — I10 Essential (primary) hypertension: Secondary | ICD-10-CM

## 2017-06-23 DIAGNOSIS — Z7189 Other specified counseling: Secondary | ICD-10-CM

## 2017-06-23 DIAGNOSIS — Z0000 Encounter for general adult medical examination without abnormal findings: Secondary | ICD-10-CM

## 2017-06-23 DIAGNOSIS — C679 Malignant neoplasm of bladder, unspecified: Secondary | ICD-10-CM

## 2017-06-23 LAB — COMPREHENSIVE METABOLIC PANEL
ALT: 22 U/L (ref 0–53)
AST: 26 U/L (ref 0–37)
Albumin: 4.4 g/dL (ref 3.5–5.2)
Alkaline Phosphatase: 80 U/L (ref 39–117)
BUN: 11 mg/dL (ref 6–23)
CO2: 28 mEq/L (ref 19–32)
Calcium: 9.8 mg/dL (ref 8.4–10.5)
Chloride: 104 mEq/L (ref 96–112)
Creatinine, Ser: 0.79 mg/dL (ref 0.40–1.50)
GFR: 101.08 mL/min (ref >60.00)
Glucose, Bld: 99 mg/dL (ref 70–99)
Potassium: 4.9 mEq/L (ref 3.5–5.1)
Sodium: 139 mEq/L (ref 135–145)
Total Bilirubin: 0.7 mg/dL (ref 0.2–1.2)
Total Protein: 7.5 g/dL (ref 6.0–8.3)

## 2017-06-23 LAB — LIPID PANEL
Cholesterol: 175 mg/dL (ref 0–200)
HDL: 55.7 mg/dL (ref >39.00)
LDL Cholesterol: 98 mg/dL (ref 0–99)
NonHDL: 119.24
Total CHOL/HDL Ratio: 3
Triglycerides: 106 mg/dL (ref 0.0–149.0)
VLDL: 21.2 mg/dL (ref 0.0–40.0)

## 2017-06-23 MED ORDER — VERAPAMIL HCL ER 240 MG PO TBCR: 240.0000 mg | EXTENDED_RELEASE_TABLET | Freq: Every day | ORAL | 3 refills | Status: DC

## 2017-06-23 NOTE — Progress Notes (Signed)
I have personally reviewed the Medicare Annual Wellness questionnaire and have noted 1. The patient's medical and social history 2. Their use of alcohol, tobacco or illicit drugs 3. Their current medications and supplements 4. The patient's functional ability including ADL's, fall risks, home safety risks and hearing or visual             impairment. 5. Diet and physical activities 6. Evidence for depression or mood disorders  The patients weight, height, BMI have been recorded in the chart and visual acuity is per eye clinic.  I have made referrals, counseling and provided education to the patient based review of the above and I have provided the pt with a written personalized care plan for preventive services.  Provider list updated- see scanned forms.  Routine anticipatory guidance given to patient.  See health maintenance. The possibility exists that previously documented standard health maintenance information may have been brought forward from a previous encounter into this note.  If needed, that same information has been updated to reflect the current situation based on today's encounter.    Flu prev done Shingles prev done PNA d/w pt Tetanus 2010 Colon 2016 Prostate cancer screening- has urology f/u pending.  D/w pt.   Advance directive. Daughter Rodena Piety or [name], to make health care decisions Would accept resuscitation but doesn't want prolonged interventions Cognitive function addressed- see scanned forms- and if abnormal then additional documentation follows.  Wife has memory loss and he is working with her as she declines.   Hypertension:    Using medication without problems or lightheadedness: yes Chest pain with exertion:no Edema: occ R ankle swelling in the summer but none now Short of breath:no Due for labs.    H/o bladder cancer pre urology.  He is on flomax and has less nocturia now.  He has routine f/u pending.  I'll defer on PSA, d/w pt.    PMH and SH  reviewed  Meds, vitals, and allergies reviewed.   ROS: Per HPI.  Unless specifically indicated otherwise in HPI, the patient denies:  General: fever. Eyes: acute vision changes ENT: sore throat Cardiovascular: chest pain Respiratory: SOB GI: vomiting GU: dysuria Musculoskeletal: acute back pain Derm: acute rash Neuro: acute motor dysfunction Psych: worsening mood Endocrine: polydipsia Heme: bleeding Allergy: hayfever  GEN: nad, alert and oriented HEENT: mucous membranes moist NECK: supple w/o LA CV: rrr. PULM: ctab, no inc wob ABD: soft, +bs EXT: no edema

## 2017-06-23 NOTE — Patient Instructions (Signed)
Don't change your meds for now.  Go to the lab on the way out.  We'll contact you with your lab report. Update me as needed.  Thanks for your effort.

## 2017-06-24 NOTE — Assessment & Plan Note (Signed)
History of, per urology.  He is on Flomax now and having less nocturia.  I will defer.  He agrees.

## 2017-06-24 NOTE — Assessment & Plan Note (Signed)
Reasonable control.  I do not want to induce hypotension.  Continue as is.  Recheck routine labs today.  Discussed with patient.  He agrees.

## 2017-06-24 NOTE — Assessment & Plan Note (Signed)
Flu prev done Shingles prev done PNA d/w pt Tetanus 2010 Colon 2016 Prostate cancer screening- has urology f/u pending.  D/w pt.   Advance directive. Daughter Rodena Piety or [name] to make health care decisions if patient were incapacitated.  Would accept resuscitation but doesn't want prolonged interventions Cognitive function addressed- see scanned forms- and if abnormal then additional documentation follows.  Wife has memory loss and he is working with her as she declines.

## 2017-08-22 IMAGING — CR DG KNEE COMPLETE 4+V*R*
4 series · 4 of 4 positions shown · non-contrast
Comparison: None.

CLINICAL DATA: Acute onset of right knee pain for 1 week. Initial
encounter.

EXAM:
RIGHT KNEE - COMPLETE 4+ VIEW

[t knee ap right]
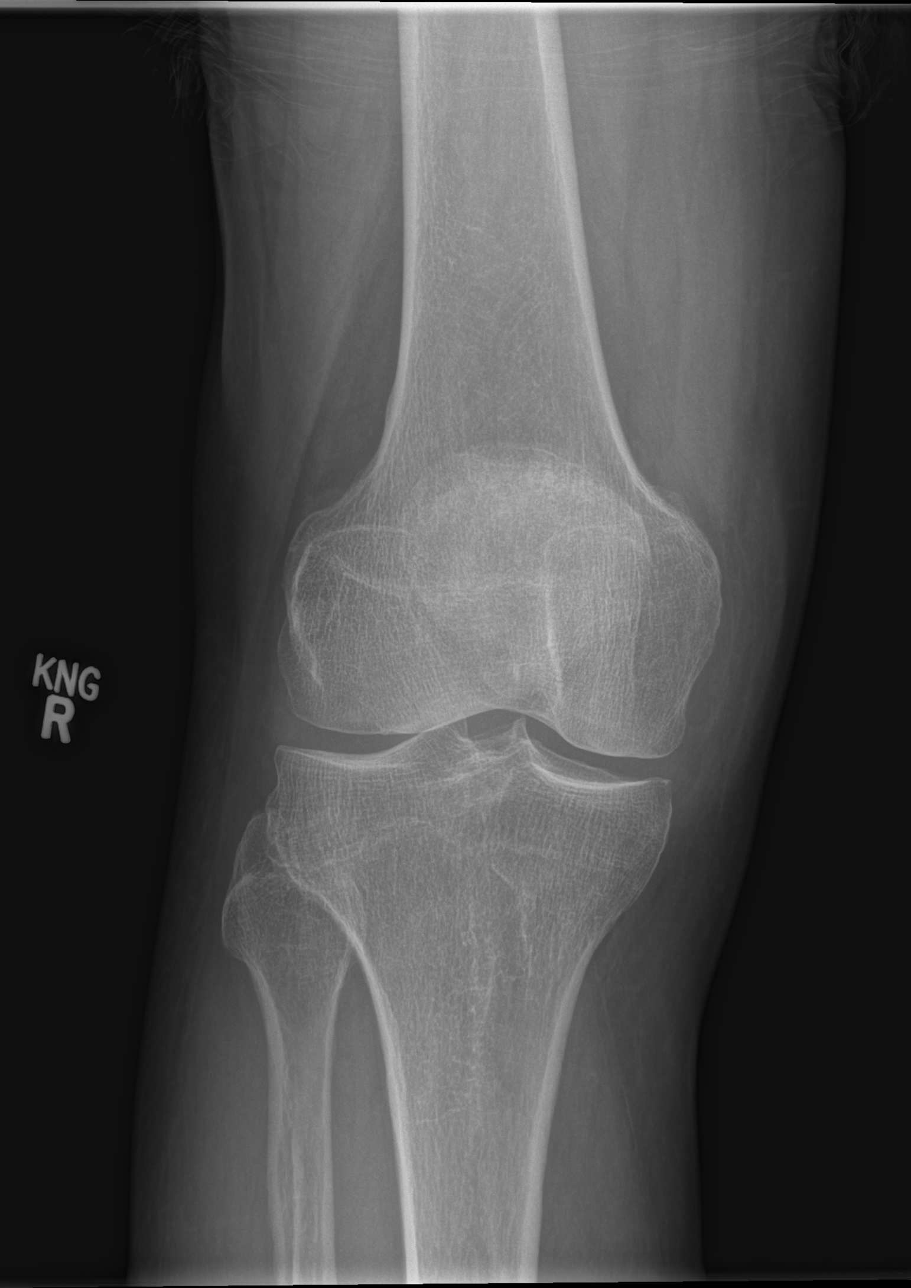

[t knee obl right (1 of 2)]
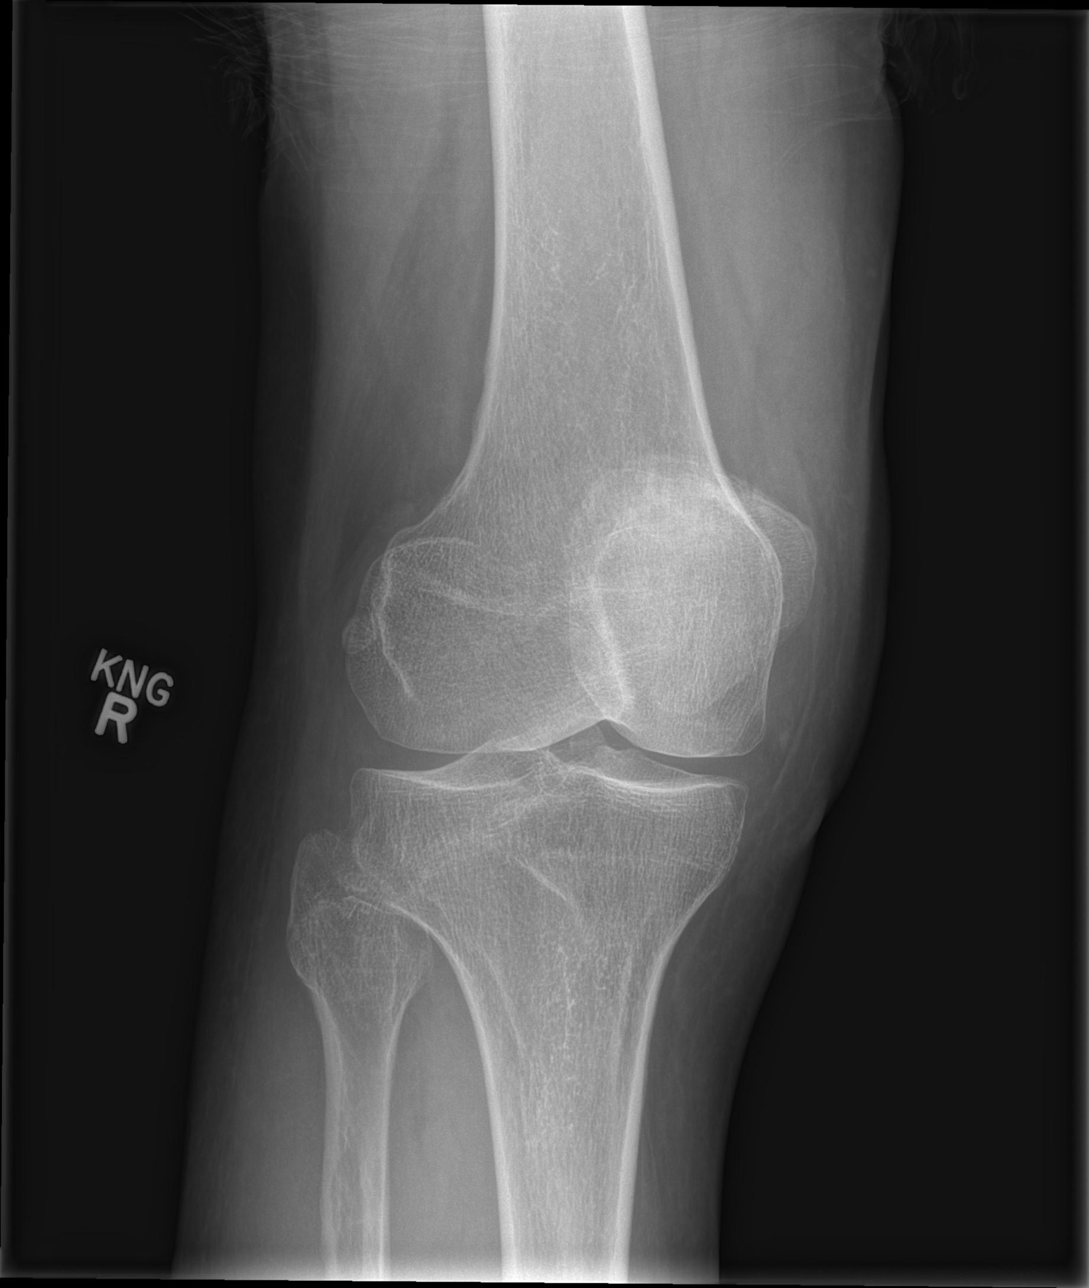

[t knee obl right (2 of 2)]
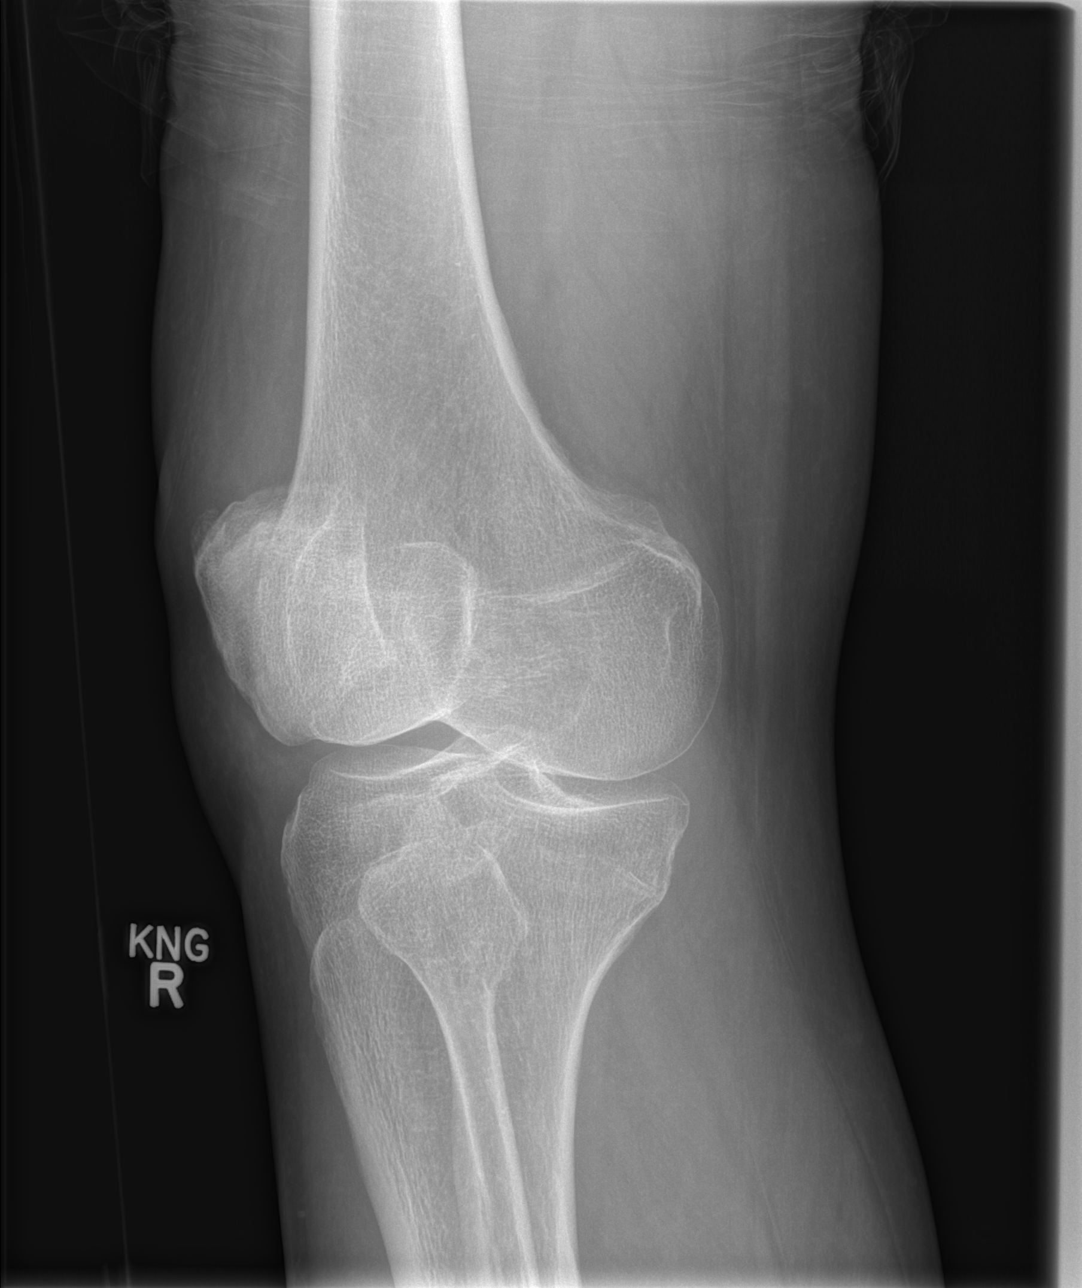

[x knee lat right]
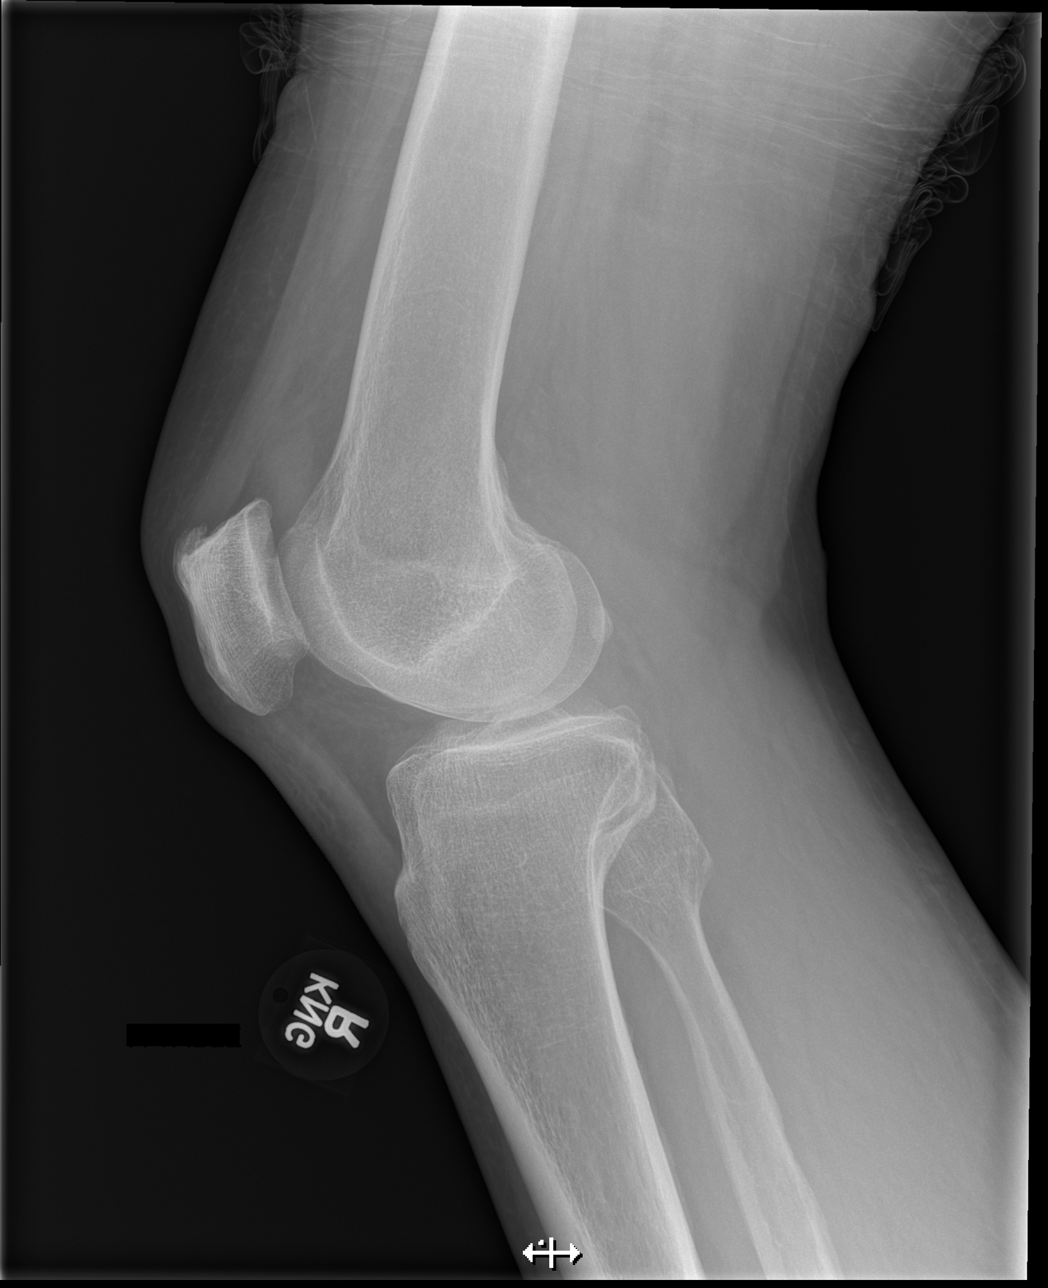

[4 of 4 positions shown; findings below may reference images not displayed]

FINDINGS: There is no evidence of fracture or dislocation. The joint spaces
are preserved. There is slight cortical irregularity at the
articular surface of the patella. An enthesophyte is seen arising at
the upper pole of the patella.

A small knee joint effusion is noted. The visualized soft tissues
are otherwise unremarkable in appearance.
IMPRESSION: 1. No evidence of fracture or dislocation.
2. Small knee joint effusion noted.
3. Minimal degenerative change at the patellofemoral compartment.

## 2017-10-10 ENCOUNTER — Ambulatory Visit (INDEPENDENT_AMBULATORY_CARE_PROVIDER_SITE_OTHER): Payer: Medicare Other | Admitting: Family Medicine

## 2017-10-10 ENCOUNTER — Emergency Department (HOSPITAL_COMMUNITY): Payer: Medicare Other

## 2017-10-10 ENCOUNTER — Encounter (HOSPITAL_COMMUNITY): Payer: Self-pay | Admitting: *Deleted

## 2017-10-10 ENCOUNTER — Other Ambulatory Visit: Payer: Self-pay

## 2017-10-10 ENCOUNTER — Encounter: Payer: Self-pay | Admitting: Family Medicine

## 2017-10-10 ENCOUNTER — Emergency Department (HOSPITAL_COMMUNITY)
Admission: EM | Admit: 2017-10-10 | Discharge: 2017-10-10 | Disposition: A | Discharge status: Home / Self Care | Payer: Medicare Other | Attending: Emergency Medicine | Admitting: Emergency Medicine

## 2017-10-10 DIAGNOSIS — Z79899 Other long term (current) drug therapy: Secondary | ICD-10-CM | POA: Insufficient documentation

## 2017-10-10 DIAGNOSIS — Z87891 Personal history of nicotine dependence: Secondary | ICD-10-CM | POA: Diagnosis not present

## 2017-10-10 DIAGNOSIS — R2681 Unsteadiness on feet: Secondary | ICD-10-CM

## 2017-10-10 DIAGNOSIS — R2689 Other abnormalities of gait and mobility: Secondary | ICD-10-CM | POA: Diagnosis not present

## 2017-10-10 DIAGNOSIS — I10 Essential (primary) hypertension: Secondary | ICD-10-CM | POA: Insufficient documentation

## 2017-10-10 DIAGNOSIS — Z8551 Personal history of malignant neoplasm of bladder: Secondary | ICD-10-CM | POA: Diagnosis not present

## 2017-10-10 DIAGNOSIS — R269 Unspecified abnormalities of gait and mobility: Secondary | ICD-10-CM

## 2017-10-10 LAB — COMPREHENSIVE METABOLIC PANEL
ALT: 20 U/L (ref 17–63)
AST: 20 U/L (ref 15–41)
Albumin: 3.8 g/dL (ref 3.5–5.0)
Alkaline Phosphatase: 91 U/L (ref 38–126)
Anion gap: 6 (ref 5–15)
BUN: 12 mg/dL (ref 6–20)
CO2: 27 mmol/L (ref 22–32)
Calcium: 9.6 mg/dL (ref 8.9–10.3)
Chloride: 107 mmol/L (ref 101–111)
Creatinine, Ser: 0.93 mg/dL (ref 0.61–1.24)
GFR calc Af Amer: >60 mL/min (ref >60)
GFR calc non Af Amer: >60 mL/min (ref >60)
Glucose, Bld: 96 mg/dL (ref 65–99)
Potassium: 3.9 mmol/L (ref 3.5–5.1)
Sodium: 140 mmol/L (ref 135–145)
Total Bilirubin: 0.7 mg/dL (ref 0.3–1.2)
Total Protein: 6.9 g/dL (ref 6.5–8.1)

## 2017-10-10 LAB — PROTIME-INR
INR: 1.01
Prothrombin Time: 13.2 seconds (ref 11.4–15.2)

## 2017-10-10 LAB — CBC
HCT: 44.4 % (ref 39.0–52.0)
Hemoglobin: 15.3 g/dL (ref 13.0–17.0)
MCH: 32.6 pg (ref 26.0–34.0)
MCHC: 34.5 g/dL (ref 30.0–36.0)
MCV: 94.7 fL (ref 78.0–100.0)
Platelets: 250 10*3/uL (ref 150–400)
RBC: 4.69 MIL/uL (ref 4.22–5.81)
RDW: 13.7 % (ref 11.5–15.5)
WBC: 6 10*3/uL (ref 4.0–10.5)

## 2017-10-10 LAB — APTT: aPTT: 28 seconds (ref 24–36)

## 2017-10-10 LAB — DIFFERENTIAL
Basophils Absolute: 0 10*3/uL (ref 0.0–0.1)
Basophils Relative: 0 %
Eosinophils Absolute: 0.2 10*3/uL (ref 0.0–0.7)
Eosinophils Relative: 3 %
Lymphocytes Relative: 27 %
Lymphs Abs: 1.6 10*3/uL (ref 0.7–4.0)
Monocytes Absolute: 0.7 10*3/uL (ref 0.1–1.0)
Monocytes Relative: 12 %
Neutro Abs: 3.5 10*3/uL (ref 1.7–7.7)
Neutrophils Relative %: 58 %

## 2017-10-10 LAB — I-STAT TROPONIN, ED: Troponin i, poc: 0 ng/mL (ref 0.00–0.08)

## 2017-10-10 LAB — RAPID URINE DRUG SCREEN, HOSP PERFORMED
Amphetamines: NOT DETECTED
Barbiturates: NOT DETECTED
Benzodiazepines: NOT DETECTED
Cocaine: NOT DETECTED
Opiates: NOT DETECTED
Tetrahydrocannabinol: NOT DETECTED

## 2017-10-10 LAB — URINALYSIS, ROUTINE W REFLEX MICROSCOPIC
Bilirubin Urine: NEGATIVE
Glucose, UA: NEGATIVE mg/dL
Hgb urine dipstick: NEGATIVE
Ketones, ur: NEGATIVE mg/dL
Leukocytes, UA: NEGATIVE
Nitrite: NEGATIVE
Protein, ur: NEGATIVE mg/dL
Specific Gravity, Urine: 1.016 (ref 1.005–1.030)
pH: 6 (ref 5.0–8.0)

## 2017-10-10 LAB — ETHANOL: Alcohol, Ethyl (B): <10 mg/dL (ref <10)

## 2017-10-10 MED ORDER — LORAZEPAM 2 MG/ML IJ SOLN
1.0000 mg | Freq: Once | INTRAMUSCULAR | Status: AC
  Administered 2017-10-10: 1 mg via INTRAVENOUS
  Filled 2017-10-10: qty 1

## 2017-10-10 NOTE — ED Provider Notes (Signed)
Care assumed at shift change from Dr. Laverta Baltimore.  See his note for full HPI and work-up.  Briefly, patient presenting to the ED with acute onset of gait instability that began today.  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  Symptoms have been gradually improving throughout the day, and is asymptomatic in the ED.  Neurology was consulted, Dr. Lorraine Lax recommended MRI.  Plan is to follow-up MRI results.  If negative, patient is safe for discharge with PCP follow-up and symptomatic management as needed at home for vertigo. Physical Exam  BP (!) 162/96   Pulse 60   Temp 97.8 F (36.6 C) (Oral)   Resp 14   SpO2 99%   Physical Exam  Constitutional: He appears well-developed and well-nourished. No distress.  HENT:  Head: Normocephalic and atraumatic.  Eyes: Conjunctivae are normal.  Cardiovascular: Normal rate.  Pulmonary/Chest: Effort normal.  Abdominal: Soft.  Neurological: He is alert.  Skin: Skin is warm.  Psychiatric: He has a normal mood and affect. His behavior is normal.  Nursing note and vitals reviewed.   ED Course/Procedures     Procedures  MDM  MRI is negative.  Discussed these results with patient.  Patient states he remains asymptomatic.  Discussed recommendation for PCP follow-up, and his home meclizine as needed for vertigo symptoms.  Discussed strict return precautions.  Safe for discharge at this time.      Aurther Harlin, [place] N, PA-C 10/10/17 2309    Margette Fast, MD 10/10/17 [phone]

## 2017-10-10 NOTE — ED Triage Notes (Signed)
The pt went to bed last pm and felt fine  This am at 0730  He got out of bed and started staggering  And had difficulty with his balance.  He went to his doctor who sent him here to r/o tia vs stroke.  No other symptoms have gotten better as the day goes on.  No pain anywhere

## 2017-10-10 NOTE — Progress Notes (Signed)
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  He is looking after his wife who has memory loss.  Vertigo going on intermittently for about 4 weeks.  He started doing home exercises for vertigo, with repositioning.  He was doing well until recently with the return of sx- that was 2 weeks ago.  Sx continued in the meantime, getting better with repositioning exercises.  Woke up this AM and his sx were different w/o room spinning but his balance was off.  He didn't feel lightheaded this AM but it happened right as he got out of bed.  Sx got better in the next 2 hours.  Clearly better as the day went on now but not resolved.  He had clear room spinning with prev events but today was clearly not like that.    PMH and SH reviewed  ROS: Per HPI unless specifically indicated in ROS section   Meds, vitals, and allergies reviewed.   GEN: nad, alert and oriented HEENT: mucous membranes moist NECK: supple w/o LA CV: rrr. PULM: ctab, no inc wob ABD: soft, +bs EXT: no edema SKIN: no acute rash CN 2-12 wnl B, S/S wnl x4, but gait is symmetrically unstable without a foot drop.

## 2017-10-10 NOTE — ED Notes (Signed)
Pt discharged from ED; instructions provided; Pt encouraged to return to ED if symptoms worsen and to f/u with PCP; Pt verbalized understanding of all instructions 

## 2017-10-10 NOTE — Patient Instructions (Signed)
Go to [facility] ER.  We'll call ahead.  Take care.  Glad to see you.

## 2017-10-10 NOTE — Discharge Instructions (Addendum)
Take meclizine as needed for dizziness or imbalance. Schedule an appointment with your primary care to follow-up on your visit today. Use the neurology referral provided to follow-up on your symptoms. Return to the ER for slurred speech, facial droop, worsening vertigo symptoms not improved with medications, severe headache, vision changes, or new or concerning symptoms.

## 2017-10-10 NOTE — ED Notes (Addendum)
Patient transported to MRI 

## 2017-10-10 NOTE — ED Notes (Signed)
The ot has been dizy for 4 weeks  Last week he started epile excercises

## 2017-10-10 NOTE — ED Provider Notes (Signed)
Emergency Department Provider Note   I have reviewed the triage vital signs and the nursing notes.   HISTORY  Chief Complaint Dizziness   HPI Richard King is a 77 y.o. male with PMH of HTN, bladder cancer, and BPH presents to the emergency department for evaluation of acute onset gait instability.  The patient states that he felt "off balance" as soon as he sat up in bed this morning.  The patient has a Lyle Leisner history of vertigo.  He had worsening symptoms over the past several weeks but has been improving with using the Epley maneuver at home.  He states this morning he was not having any vertigo symptoms but when he would stand up he would almost fall over.  He states he was attempting to walk and would stagger to the sides, again without any vertigo symptoms.  No sudden onset, severe headache.  No fevers or chills.  No numbness or tingling in the arms or legs.  No vision changes.  No voice changes.   Past Medical History:  Diagnosis Date  . Arthritis   . Bladder cancer ([place])   . BPH (benign prostatic hypertrophy)   . ED (erectile dysfunction)   . History of nephrolithiasis   . Hx of colonic polyps   . Hypertension     Patient Active Problem List   Diagnosis Date Noted  . Dependent family member needing care at home 11/13/2015  . 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 Knee pain 05/18/2015  . Nasal congestion 05/18/2015  . Skin lesion 03/30/2015  . Advance care planning 11/05/2014  . 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 Lower abdominal pain 09/04/2014  . ED (erectile dysfunction) 11/06/2011  . Vertigo 05/17/2011  . Medicare annual wellness visit, subsequent 05/10/2011  . Bladder cancer ([place])   . Essential hypertension 09/07/2008  . BENIGN PROSTATIC HYPERTROPHY 09/07/2008  . COLONIC POLYPS, HX OF 09/07/2008  . NEPHROLITHIASIS, HX OF 09/07/2008    Past Surgical History:  Procedure Laterality Date  . bladder cancer shaving     hx bladder cancer  . CATARACT EXTRACTION     . COLONOSCOPY    . LITHOTRIPSY     per Dr. Risa Grill  . POLYPECTOMY  2009   HPP  . 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 Order #: [id number] Class: Historical Med  . Order #: [id number] Class: Normal    Allergies Ivp dye [iodinated diagnostic agents] and Codeine  Family History  Problem Relation Age of Onset  . Hypertension Father   . Cancer Neg Hx   . Diabetes Neg Hx   . Colon cancer Neg Hx   . Prostate cancer Neg Hx     Social History Social History   Tobacco Use  . Smoking status: Former Smoker    Last attempt to quit: 06/03/1976    Years since quitting: 41.3  . Smokeless tobacco: Never Used  Substance Use Topics  . Alcohol use: No    Alcohol/week: 0.0 oz  . Drug use: No    Review of Systems  Constitutional: No fever/chills Eyes: No visual changes. ENT: No sore throat. Intermittent vertigo (resolved)  Cardiovascular: Denies chest pain. Respiratory: Denies shortness of breath. Gastrointestinal: No abdominal pain.  No nausea, no vomiting.  No diarrhea.  No constipation. Genitourinary: Negative for dysuria. Musculoskeletal: Negative for back pain. Skin: Negative for rash. Neurological: Negative for headaches, focal weakness or numbness. Positive gait instability (resolved).  10-point ROS otherwise negative.  ____________________________________________   PHYSICAL EXAM:  VITAL SIGNS: ED Triage Vitals  Enc Vitals Group     BP 10/10/17 1518 (!) 168/95     Pulse Rate 10/10/17 1518 71     Resp 10/10/17 1518 18     Temp 10/10/17 1518 97.8 F (36.6 C)     Temp Source 10/10/17 1518 Oral     SpO2 10/10/17 1518 99 %     Pain Score 10/10/17 1539 0   Constitutional: Alert and oriented. Well appearing and in no acute distress. Eyes: Conjunctivae are normal. PERRL. EOMI. Head: Atraumatic. Nose: No congestion/rhinnorhea. Mouth/Throat: Mucous membranes are moist.   Neck: No  stridor. Cardiovascular: Normal rate, regular rhythm. Good peripheral circulation. Grossly normal heart sounds.   Respiratory: Normal respiratory effort.  No retractions. Lungs CTAB. Gastrointestinal: Soft and nontender. No distention.  Musculoskeletal: No lower extremity tenderness nor edema. No gross deformities of extremities. Neurologic:  Normal speech and language. No gross focal neurologic deficits are appreciated. Normal CN exam 2-12. Normal gait. Negative Romberg. Normal finger-to-nose testing. No pronator drift.  Skin:  Skin is warm, dry and intact. No rash noted.   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 Evaluate for TIA or stroke. EXAM: MRI HEAD WITHOUT CONTRAST TECHNIQUE: Multiplanar, multiecho pulse sequences of the brain and surrounding structures were obtained without intravenous contrast. COMPARISON:  06/01/2012 CT head. FINDINGS: Brain: No acute infarction, hemorrhage, hydrocephalus, extra-axial collection or mass lesion. Mild nonfocal brain parenchymal volume loss for age. Vascular: Normal flow voids. Skull and  upper cervical spine: Normal marrow signal. Sinuses/Orbits: Negative. Other: Bilateral intra-ocular lens replacement. IMPRESSION: 1. No acute intracranial abnormality identified. 2. Mild brain parenchymal volume loss for age. Electronically Signed   By: Kristine Garbe M.D.   On: 10/10/2017 22:22    ____________________________________________   PROCEDURES  Procedure(s) performed:   Procedures  None ____________________________________________   INITIAL IMPRESSION / ASSESSMENT AND PLAN / ED COURSE  Pertinent labs & imaging results that were available during my care of the patient were reviewed by me and considered in my medical decision making (see chart for details).  Patient presents to the ED with intermittent vertigo and gait instability. Symptoms gradually improving throughout the day. Labs reviewed with no acute findings. Discussed case with Dr. Lawson Fiscal who advises MRI and discharge if negative for CVA. No indication for TIA w/u given gradual improvement and vertigo history. EKG reviewed with no acute findings.   MRI pending. Care transferred to oncoming provider. Discussed plan for discharge if MRI is negative.     ____________________________________________  FINAL CLINICAL IMPRESSION(S) / ED DIAGNOSES  Final diagnoses:  Gait instability     MEDICATIONS GIVEN  DURING THIS VISIT:  Medications  LORazepam (ATIVAN) injection 1 mg (1 mg Intravenous Given 10/10/17 2126)    Note:  This document was prepared using Dragon voice recognition software and may include unintentional dictation errors.  Nanda Quinton, MD Emergency Medicine   Demetreus Lothamer, [facility], MD 10/10/17 2256

## 2017-10-12 ENCOUNTER — Encounter: Payer: Self-pay | Admitting: Family Medicine

## 2017-10-12 DIAGNOSIS — R260 Ataxic gait: Secondary | ICD-10-CM | POA: Insufficient documentation

## 2017-10-12 NOTE — Assessment & Plan Note (Signed)
I did not do heel-to-toe/tandem gait testing since he was already wobbly with baseline gait.  This is clearly different from previous vertigo symptoms.  Discussed with patient.  The concern is for subacute CVA.  Discussed with patient about options.  Advised emergency room evaluation.  His daughter is here with him today and she will take him directly to the emergency room.  We called ahead about pending ambulatory arrival and I appreciate the help of all involved.  He is out of the 3-hour window for treatment if he does have CVA. >25 minutes spent in face to face time with patient, >50% spent in counselling or coordination of care.

## 2017-10-13 ENCOUNTER — Encounter: Payer: Self-pay | Admitting: Family Medicine

## 2017-10-13 ENCOUNTER — Ambulatory Visit: Payer: Medicare Other | Admitting: Family Medicine

## 2017-10-13 VITALS — BP 138/72 | HR 60 | Temp 97.9°F | Ht 71.0 in | Wt 234.8 lb

## 2017-10-13 DIAGNOSIS — R260 Ataxic gait: Secondary | ICD-10-CM | POA: Diagnosis not present

## 2017-10-13 LAB — SEDIMENTATION RATE: Sed Rate: 8 mm/hr (ref 0–20)

## 2017-10-13 LAB — VITAMIN B12: Vitamin B-12: 117 pg/mL — ABNORMAL LOW (ref 211–911)

## 2017-10-13 LAB — TSH: TSH: 3.63 u[IU]/mL (ref 0.35–4.50)

## 2017-10-13 MED ORDER — HYDROCORTISONE-ACETIC ACID 1-2 % OT SOLN: 4.0000 [drp] | Freq: Every evening | OTIC | 1 refills | Status: DC | PRN

## 2017-10-13 NOTE — Patient Instructions (Addendum)
It looks like the unsteadiness is a different issue from the room spinning.  Keep doing the vertigo exercises as needed.  Go to the lab on the way out.  We'll contact you with your lab report. We will call about your referral.  Rosaria Ferries or Azalee Course will call you if you don't see one of them on the way out.  Take care.  Glad to see you.

## 2017-10-13 NOTE — Progress Notes (Signed)
ER eval d/w pt.  MRI with  1. No acute intracranial abnormality identified. 2. Mild brain parenchymal volume loss for age.  He still some sensation of like he is "walking on a boat" and unsteady but some better than prev compared to the last OV.  He is still doing home exercise for vertigo with some improvement with that.  It is clear that the recent episode of gait changes was very different than his previous symptoms of vertigo.  He needed a refill on ear drops, had used episodically/rarely.    PMH and SH reviewed  ROS: Per HPI unless specifically indicated in ROS section   Meds, vitals, and allergies reviewed.   GEN: nad, alert and oriented HEENT: mucous membranes moist NECK: supple w/o LA CV: rrr. PULM: ctab, no inc wob ABD: soft, +bs EXT: no edema SKIN: no acute rash CN 2-12 wnl B, S/S/DTR wnl x4 His regular gait is better/appears normal but his heel to toe testing is abnormal- unclear if this is a new finding/unclear about the duration of this finding.

## 2017-10-14 ENCOUNTER — Encounter: Payer: Self-pay | Admitting: Family Medicine

## 2017-10-14 NOTE — Assessment & Plan Note (Signed)
Unclear source.  Unclear how long he has had trouble with heel-to-toe testing.  His typical gait was not affected until recently but his better today.  His balance was so poor previous office visit he was not able to attempt heel-to-toe testing at all.  At this point okay for outpatient follow-up.  Check routine labs.  Refer to neurology.  MRI unremarkable.  Differential diagnosis discussed with patient. >25 minutes spent in face to face time with patient, >50% spent in counselling or coordination of care.

## 2017-10-15 ENCOUNTER — Encounter: Payer: Self-pay | Admitting: Neurology

## 2017-10-15 ENCOUNTER — Encounter: Payer: Self-pay | Admitting: Family Medicine

## 2017-10-16 ENCOUNTER — Other Ambulatory Visit: Payer: Self-pay | Admitting: Family Medicine

## 2017-10-16 DIAGNOSIS — E538 Deficiency of other specified B group vitamins: Secondary | ICD-10-CM

## 2017-10-16 MED ORDER — CYANOCOBALAMIN 1000 MCG/ML IJ SOLN: INTRAMUSCULAR | Status: DC

## 2017-10-20 NOTE — Progress Notes (Signed)
[facility] Clinic Note - Initial Visit   Date: 10/21/17  Richard King MRN: [id number] DOB: Apr 02, 1941   Dear Dr. Damita Dunnings:  Thank you for your kind referral of Richard King for consultation of gait ataxia. Although his history is well known to you, please allow [place] to reiterate it for the purpose of our medical record. The patient was accompanied to the clinic by daughter who also provides collateral information.     History of Present Illness: Richard King is a 77 y.o. right-handed Caucasian male with hypertension, BPH, history of bladder cancer, and vertigo presenting for evaluation of imbalance.    Starting on April 16th, he developed spell of vertigo with nausea, which lasted about a week.  He did well for a week, and then it returned again.  He tried his own Epley's maneuver at home which slowly improved his symptoms over 1-2 weeks.  On 5/10, he woke up with acute onset of lightheadedness without spinning/dizziness.  He recalls that his balance was very difficult and was holding onto someone to be able to walk. He went to the ER on 5/10, where MRI brain did not show show acute stroke.  He was also found to have vitamin B12 deficiency and is getting weekly injections x 4 weeks, then monthly.  He symptoms slowly improved but did not completely resolve and very different to his prior vertigo spells.  Symptoms are not constant and tend to occur daily, worse in the morning, lasting several hours.  Neck flexion and extension tends to trigger his lightheadedness. He feels that after being on his tractor or riding mower for a long time, his symptoms are worse.   He also complains of drainage from both ears and stuffed sensation, which alternates between each ear. He has seen ENT for this in the past.   Out-side paper records, electronic medical record, and images have been reviewed where available and summarized as:  MRI brain 10/10/2017:   1. No acute intracranial  abnormality identified. 2. Mild brain parenchymal volume loss for age.  Lab Results  Component Value Date   VITAMINB12 117 (L) 10/13/2017   Lab Results  Component Value Date   TSH 3.63 10/13/2017    Past Medical History:  Diagnosis Date  . Arthritis   . B12 deficiency   . Bladder cancer ([place])   . BPH (benign prostatic hypertrophy)   . ED (erectile dysfunction)   . History of nephrolithiasis   . Hx of colonic polyps   . Hypertension     Past Surgical History:  Procedure Laterality Date  . bladder cancer shaving     hx bladder cancer  . CATARACT EXTRACTION    . COLONOSCOPY    . LITHOTRIPSY     per Dr. Risa Grill  . POLYPECTOMY  2009   HPP  . 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 acetic acid-hydrocortisone (VOSOL-HC) OTIC solution Place 4 drops into both ears at bedtime as needed.  . cyanocobalamin (,VITAMIN B-12,) 1000 MCG/ML injection 1058mcg IM weekly x4 doses then monthly  . tamsulosin (FLOMAX) 0.4 MG CAPS capsule Take 0.4 mg by mouth.  . verapamil (CALAN-SR) 240 MG CR tablet Take 1 tablet (240 mg total) by mouth at bedtime.   No facility-administered encounter medications on file as of 10/21/2017.      Allergies:  Allergies  Allergen  Reactions  . Ivp Dye [Iodinated Diagnostic Agents] Swelling  . Codeine     Family History: Family History  Problem Relation Age of Onset  . Hypertension Father   . AAA (abdominal aortic aneurysm) Father   . Stroke Sister   . Cancer Neg Hx   . Diabetes Neg Hx   . Colon cancer Neg Hx   . Prostate cancer Neg Hx     Social History: Social History   Tobacco Use  . Smoking status: Former Smoker    Last attempt to quit: 06/03/1976    Years since quitting: 41.4  . Smokeless tobacco: Never Used  Substance Use Topics  . Alcohol use: No    Alcohol/week: 0.0 oz  . Drug use: No   Social  History   Social History Narrative   Daughter Rodena Piety or [name], to make health care decisions   Would accept resuscitation but doesn't want prolonged interventions   Married 1964    Review of Systems:  CONSTITUTIONAL: No fevers, chills, night sweats, or weight loss.   EYES: No visual changes or eye pain +ear drainage ENT: No hearing changes.  No history of nose bleeds.   RESPIRATORY: No cough, wheezing and shortness of breath.   CARDIOVASCULAR: Negative for chest pain, and palpitations.   GI: Negative for abdominal discomfort, blood in stools or black stools.  No recent change in bowel habits.   GU:  No history of incontinence.   MUSCLOSKELETAL: No history of joint pain or swelling.  No myalgias.   SKIN: Negative for lesions, rash, and itching.   HEMATOLOGY/ONCOLOGY: Negative for prolonged bleeding, bruising easily, and swollen nodes.  +history of cancer.   ENDOCRINE: Negative for cold or heat intolerance, polydipsia or goiter.   PSYCH:  No depression or anxiety symptoms.   NEURO: As Above.   Vital Signs:  BP 110/70   Pulse 73   Ht 5\' 11"  (1.803 m)   Wt 231 lb 4 oz (104.9 kg)   SpO2 94%   BMI 32.25 kg/m    General Medical Exam:   General:  Well appearing, comfortable.   Eyes/ENT: see cranial nerve examination.   Neck: No masses appreciated.  Full range of motion without tenderness.  No carotid bruits. Respiratory:  Clear to auscultation, good air entry bilaterally.   Cardiac:  Regular rate and rhythm, no murmur.   Extremities:  No deformities, edema, or skin discoloration.  Skin:  No rashes or lesions.  Neurological Exam: MENTAL STATUS including orientation to time, place, person, recent and remote memory, attention span and concentration, language, and fund of knowledge is normal.  Speech is not dysarthric.  CRANIAL NERVES: II:  No visual field defects.  Unremarkable fundi.   III-IV-VI: Pupils equal round and reactive to light.  Normal conjugate, extra-ocular eye  movements in all directions of gaze.  No nystagmus.  No ptosis.   V:  Normal facial sensation.    VII:  Normal facial symmetry and movements.    VIII:  Normal hearing and vestibular function.   IX-X:  Normal palatal movement.   XI:  Normal shoulder shrug and head rotation.   XII:  Normal tongue strength and range of motion, no deviation or fasciculation.  MOTOR:  No atrophy, fasciculations or abnormal movements.  No pronator drift.  Tone is normal.    Right Upper Extremity:    Left Upper Extremity:    Deltoid  5/5   Deltoid  5/5   Biceps  5/5   Biceps  5/5  Triceps  5/5   Triceps  5/5   Wrist extensors  5/5   Wrist extensors  5/5   Wrist flexors  5/5   Wrist flexors  5/5   Finger extensors  5/5   Finger extensors  5/5   Finger flexors  5/5   Finger flexors  5/5   Dorsal interossei  5/5   Dorsal interossei  5/5   Abductor pollicis  5/5   Abductor pollicis  5/5   Tone (Ashworth scale)  0  Tone (Ashworth scale)  0   Right Lower Extremity:    Left Lower Extremity:    Hip flexors  5/5   Hip flexors  5/5   Hip extensors  5/5   Hip extensors  5/5   Knee flexors  5/5   Knee flexors  5/5   Knee extensors  5/5   Knee extensors  5/5   Dorsiflexors  5/5   Dorsiflexors  5/5   Plantarflexors  5/5   Plantarflexors  5/5   Toe extensors  5/5   Toe extensors  5/5   Toe flexors  5/5   Toe flexors  5/5   Tone (Ashworth scale)  0  Tone (Ashworth scale)  0   MSRs:  Right                                                                 Left brachioradialis 2+  brachioradialis 2+  biceps 2+  biceps 2+  triceps 2+  triceps 2+  patellar 2+  patellar 2+  ankle jerk 1+  ankle jerk 1+  Hoffman no  Hoffman no  plantar response down  plantar response down   SENSORY:  Vibration is reduced at the great toe bilaterally (age-appropriate), intact above the ankles.  Temperature and pin prick is intact.  Romberg's sign is positive.   COORDINATION/GAIT: Normal finger-to- nose-finger and heel-to-shin.  Intact  rapid alternating movements bilaterally.  Gait narrow based and stable.  He is mildly unsteady with tandem and stressed gait, but is able to perform.    IMPRESSION: 1.  Lightheadedness provoked by neck extension/flexion without vertigo. His exam is non-focal and does not show any localizing signs, no dysmetria, nystagmus, and mild ataxia with tandem gait.  MRI brain does not show cerebellar pathology or acute stroke. With his father's history of aneurysm, he needs to be evaluated for vascular pathology of the head and neck which could potentially cause these symptoms, as it is very different from his classic vertigo spells.  MRA had and neck to assess for aneurysm.  2.  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  Continue vitamin B12 injections.   Further recommendations pending results.    Thank you for allowing me to participate in patient's care.  If I can answer any additional questions, I would be pleased to do so.    Sincerely,    Deatra Mcmahen K. Posey Pronto, DO

## 2017-10-21 ENCOUNTER — Telehealth: Payer: Self-pay | Admitting: Neurology

## 2017-10-21 ENCOUNTER — Encounter: Payer: Self-pay | Admitting: Neurology

## 2017-10-21 ENCOUNTER — Ambulatory Visit (INDEPENDENT_AMBULATORY_CARE_PROVIDER_SITE_OTHER): Payer: Medicare Other

## 2017-10-21 ENCOUNTER — Ambulatory Visit: Payer: Medicare Other | Admitting: Neurology

## 2017-10-21 VITALS — BP 110/70 | HR 73 | Ht 71.0 in | Wt 231.2 lb

## 2017-10-21 DIAGNOSIS — R42 Dizziness and giddiness: Secondary | ICD-10-CM

## 2017-10-21 DIAGNOSIS — Z8249 Family history of ischemic heart disease and other diseases of the circulatory system: Secondary | ICD-10-CM | POA: Diagnosis not present

## 2017-10-21 DIAGNOSIS — R260 Ataxic gait: Secondary | ICD-10-CM

## 2017-10-21 DIAGNOSIS — E538 Deficiency of other specified B group vitamins: Secondary | ICD-10-CM | POA: Diagnosis not present

## 2017-10-21 MED ORDER — CYANOCOBALAMIN 1000 MCG/ML IJ SOLN
1000.0000 ug | Freq: Once | INTRAMUSCULAR | Status: AC
  Administered 2017-10-21: 1000 ug via INTRAMUSCULAR

## 2017-10-21 NOTE — Patient Instructions (Addendum)
MRA head and neck.  We will call you with the results.

## 2017-10-21 NOTE — Addendum Note (Signed)
Addended by: Chester Holstein on: 10/21/2017 03:02 PM   Modules accepted: Orders

## 2017-10-24 ENCOUNTER — Telehealth: Payer: Self-pay | Admitting: Family Medicine

## 2017-10-24 ENCOUNTER — Ambulatory Visit: Payer: Self-pay

## 2017-10-24 ENCOUNTER — Encounter: Payer: Self-pay | Admitting: Family Medicine

## 2017-10-24 NOTE — Telephone Encounter (Signed)
Magda Paganini RN also noted; See note. Pt. "Richard King." His contact number is [phone]. Thanks. Looks family has already talked to [place].   Routing comment

## 2017-10-24 NOTE — Telephone Encounter (Signed)
See mychart message. Call them and triage this.  They shouldn't send emergent/urgent issues via mychart.

## 2017-10-24 NOTE — Telephone Encounter (Addendum)
Pt. Reports he has these "dizzy spells for 6 weeks and I've seen the neurologist for this. I have another MRI scheduled for Sunday." States he had another episode this morning and "was wobbly and had to hold on to walk." " I feel about the same as I have." Reports he rested and dizziness is better. Wanted Dr. Damita Dunnings to know.  Answer Assessment - Initial Assessment Questions 1. DESCRIPTION: "Describe your dizziness."     Dizziness 2. LIGHTHEADED: "Do you feel lightheaded?" (e.g., somewhat faint, woozy, weak upon standing)     Dizziness is gone now. 3. VERTIGO: "Do you feel like either you or the room is spinning or tilting?" (i.e. vertigo)     Room was spinning 4. SEVERITY: "How bad is it?"  "Do you feel like you are going to faint?" "Can you stand and walk?"   - MILD - walking normally   - MODERATE - interferes with normal activities (e.g., work, school)    - SEVERE - unable to stand, requires support to walk, feels like passing out now.      Moderate 5. ONSET:  "When did the dizziness begin?"     This morning at 0830. 6. AGGRAVATING FACTORS: "Does anything make it worse?" (e.g., standing, change in head position)    " Just hits all of sudden" 7. HEART RATE: "Can you tell me your heart rate?" "How many beats in 15 seconds?"  (Note: not all patients can do this)       Unsure 8. CAUSE: "What do you think is causing the dizziness?"     Unsure 9. RECURRENT SYMPTOM: "Have you had dizziness before?" If so, ask: "When was the last time?" "What happened that time?"     Yes 10. OTHER SYMPTOMS: "Do you have any other symptoms?" (e.g., fever, chest pain, vomiting, diarrhea, bleeding)       No 11. PREGNANCY: "Is there any chance you are pregnant?" "When was your last menstrual period?"       No  Protocols used: DIZZINESS [facility]

## 2017-10-24 NOTE — Telephone Encounter (Signed)
Noted. Thanks. Agreed.  

## 2017-10-24 NOTE — Telephone Encounter (Signed)
Daughter Rodena Piety) says that the episode this morning was a bit worse than usual but this is really the same thing that he has been dealing with for a while now.  Daughter says he has an MRI scheduled on Sunday and she suggested that he just try to stay on the couch, etc for today and take it easy.  Daughter was advised that if things get worse, call or go to ER.  Daughter understands and agrees but does not feel that anything more needs to be done today.  I explained our concern because it sounded as if this was a more urgent request from [place].  Daughter says they didn't mean for it to be that way.

## 2017-10-26 ENCOUNTER — Ambulatory Visit
Admission: RE | Admit: 2017-10-26 | Discharge: 2017-10-26 | Disposition: A | Discharge status: Home / Self Care | Payer: Medicare Other | Source: Ambulatory Visit | Attending: Neurology | Admitting: Neurology

## 2017-10-26 DIAGNOSIS — Z8249 Family history of ischemic heart disease and other diseases of the circulatory system: Secondary | ICD-10-CM

## 2017-10-26 DIAGNOSIS — R42 Dizziness and giddiness: Secondary | ICD-10-CM

## 2017-10-26 DIAGNOSIS — R260 Ataxic gait: Secondary | ICD-10-CM

## 2017-10-29 ENCOUNTER — Ambulatory Visit (INDEPENDENT_AMBULATORY_CARE_PROVIDER_SITE_OTHER): Payer: Medicare Other

## 2017-10-29 ENCOUNTER — Telehealth: Payer: Self-pay | Admitting: Neurology

## 2017-10-29 DIAGNOSIS — E538 Deficiency of other specified B group vitamins: Secondary | ICD-10-CM | POA: Diagnosis not present

## 2017-10-29 MED ORDER — CYANOCOBALAMIN 1000 MCG/ML IJ SOLN
1000.0000 ug | Freq: Once | INTRAMUSCULAR | Status: AC
  Administered 2017-10-29: 1000 ug via INTRAMUSCULAR

## 2017-10-29 NOTE — Telephone Encounter (Signed)
Patient's daughter called regarding her dad and him having a hard time during the MRI. They are needing it rescheduled at [facility] with Sedation. The daughter is requesting that if you cal to set it up that it not be the week of 11/17/17. Thanks

## 2017-10-29 NOTE — Progress Notes (Signed)
Per orders of Dr. Ria Bush, injection of Vit B 12 given by Ozzie Hoyle. Patient tolerated injection well.

## 2017-10-30 ENCOUNTER — Ambulatory Visit: Payer: Medicare Other | Admitting: Family Medicine

## 2017-10-30 ENCOUNTER — Encounter: Payer: Self-pay | Admitting: Family Medicine

## 2017-10-30 DIAGNOSIS — Z636 Dependent relative needing care at home: Secondary | ICD-10-CM

## 2017-10-30 DIAGNOSIS — H6980 Other specified disorders of Eustachian tube, unspecified ear: Secondary | ICD-10-CM

## 2017-10-30 DIAGNOSIS — R260 Ataxic gait: Secondary | ICD-10-CM | POA: Diagnosis not present

## 2017-10-30 DIAGNOSIS — H6990 Unspecified Eustachian tube disorder, unspecified ear: Secondary | ICD-10-CM

## 2017-10-30 DIAGNOSIS — E538 Deficiency of other specified B group vitamins: Secondary | ICD-10-CM | POA: Diagnosis not present

## 2017-10-30 MED ORDER — FLUTICASONE PROPIONATE 50 MCG/ACT NA SUSP: 2.0000 | Freq: Every day | NASAL | 1 refills | Status: DC

## 2017-10-30 NOTE — Patient Instructions (Signed)
Use flonase daily, gently try to pop your ears, and I'll await the MRI.  Take care.  Glad to see you.

## 2017-10-30 NOTE — Progress Notes (Signed)
Richard King tried to go though with the MRI recently but couldn't tolerate it due to claustrophobia.  D/w pt.  Richard King has f/u MRI with sedation pending.  I am awaiting results.    Richard King felt good on Monday but more sx on Tuesday and Wednesday.  "Today I'm 95% better."  Richard King doesn't have vertigo sx now (that got better prev) but has sx with neck flexion/ext sx some of the time- some days but not others.  Richard King has normal neck ROM at this point at time of OV w/o sx.    Richard King has L ear popping this AM but it opened/closed back up in the meantime.  Richard King was asking about ENT eval- Richard King had seen Dr. Jaclynn Major.  Richard King doesn't feel stuffy.    Richard King is in the midst of B12 replacement. D/w pt.    We talked about his caregiver strain and Richard King was asking about medication for this.  We elected to defer this until I had seen his MRI results.  Meds, vitals, and allergies reviewed.   ROS: Per HPI unless specifically indicated in ROS section   GEN: nad, alert and oriented HEENT: mucous membranes moist, no cerumen in either ear canal.  Richard King does have L tympanic membrane movement with Valsalva NECK: supple w/o LA, no symptoms with neck flexion or extension or rotation. CV: rrr. PULM: ctab, no inc wob ABD: soft, +bs EXT: no edema SKIN: no acute rash

## 2017-10-31 DIAGNOSIS — H6980 Other specified disorders of Eustachian tube, unspecified ear: Secondary | ICD-10-CM | POA: Insufficient documentation

## 2017-10-31 DIAGNOSIS — H6990 Unspecified Eustachian tube disorder, unspecified ear: Secondary | ICD-10-CM | POA: Insufficient documentation

## 2017-10-31 NOTE — Assessment & Plan Note (Addendum)
He previously had symptoms of imbalance with neck flexion and extension.  He does not have those symptoms now.  Unclear source.  He has follow-up MRI pending.  I will defer to neurology.  I appreciate the help of all involved. >25 minutes spent in face to face time with patient, >50% spent in counselling or coordination of care.

## 2017-10-31 NOTE — Assessment & Plan Note (Signed)
Wife with dementia, we deferred daily medication (example: possible SSRI) until after MRI is done.

## 2017-10-31 NOTE — Assessment & Plan Note (Signed)
He currently has tympanic membrane movement with Valsalva.  Likely with intermittent symptoms.  Okay to use Flonase.  Discussed with him about gently performing Valsalva maneuver as needed.  Update me as needed.  He agrees.

## 2017-10-31 NOTE — Assessment & Plan Note (Signed)
Discussed with patient.  Continue replacement.  We will recheck level later on.  Unclear how much this is contributing to his recent symptoms.

## 2017-11-03 ENCOUNTER — Other Ambulatory Visit: Payer: Self-pay | Admitting: *Deleted

## 2017-11-03 DIAGNOSIS — Z8249 Family history of ischemic heart disease and other diseases of the circulatory system: Secondary | ICD-10-CM

## 2017-11-03 DIAGNOSIS — R42 Dizziness and giddiness: Secondary | ICD-10-CM

## 2017-11-03 DIAGNOSIS — R260 Ataxic gait: Secondary | ICD-10-CM

## 2017-11-03 NOTE — Telephone Encounter (Signed)
Let's order CTA head and CTA neck instead of MRI, this will be much quicker.

## 2017-11-03 NOTE — Telephone Encounter (Signed)
Please advise 

## 2017-11-03 NOTE — Telephone Encounter (Signed)
Left Rodena Piety a message about ordering the CTAs.  Orders placed.

## 2017-11-05 ENCOUNTER — Ambulatory Visit: Payer: Self-pay | Admitting: *Deleted

## 2017-11-05 ENCOUNTER — Ambulatory Visit (INDEPENDENT_AMBULATORY_CARE_PROVIDER_SITE_OTHER): Payer: Medicare Other

## 2017-11-05 ENCOUNTER — Telehealth: Payer: Self-pay | Admitting: Family Medicine

## 2017-11-05 DIAGNOSIS — E538 Deficiency of other specified B group vitamins: Secondary | ICD-10-CM | POA: Diagnosis not present

## 2017-11-05 NOTE — Telephone Encounter (Signed)
Called received from [place] in the office for the pt to be triaged for dizziness. Pt states he has been having dizziness for approximately 8 weeks and has good days and bad days. Pt states yesterday was one of his good days and he felt better than he had in a long time. Pt states he went outside to do some work and he now thinks that he may have over done it. Pt states he has been feeling lightheaded since yesterday and feels like when he stands up to walk he feels like he staggers and like he is walking on a mattress. Pt denies having to hold onto anything to keep his balance and denies any chest pain, shortness of breath or any other symptoms. Pt states that he has been drinking and eating without difficulty and does not feel dehydrated.Pt states he was seen by Dr. Damita Dunnings previously for this and is also being followed by neurology and an ear doctor.  Pt was seen for OV on 5/30 with Dr. Damita Dunnings. Pt states he has a prescription for Meclizine that he was given previously but when he took this medication before it did not really help.Pt states he does not want to come in for appt, but wanted to see what Dr. Damita Dunnings thinks he should do at this time.  Reason for Disposition . [1] MODERATE dizziness (e.g., interferes with normal activities) AND [2] has been evaluated by physician for this  Answer Assessment - Initial Assessment Questions 1. DESCRIPTION: "Describe your dizziness."     Feels lightheaded, like he is walking on a mattress 2. LIGHTHEADED: "Do you feel lightheaded?" (e.g., somewhat faint, woozy, weak upon standing)     Feels lightheaded 3. VERTIGO: "Do you feel like either you or the room is spinning or tilting?" (i.e. 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 SEVERITY: "How bad is it?"  "Do you feel like you are going to faint?" "Can you stand and walk?"   - MILD - walking normally   - MODERATE - interferes with normal activities (e.g., work, school)     - SEVERE - unable to stand, requires support to walk, feels like passing out now.      Moderate 5. ONSET:  "When did the dizziness begin?"     Worsened on yesterday, but pt states he has been dealing with dizziness for 8 weeks 6. AGGRAVATING FACTORS: "Does anything make it worse?" (e.g., standing, change in head position)     Standing up and changing head positions sometimes while lying in bed 7. HEART RATE: "Can you tell me your heart rate?" "How many beats in 15 seconds?"  (Note: not all patients can do this)       no 8. CAUSE: "What do you think is causing the dizziness?"     unsure 9. RECURRENT SYMPTOM: "Have you had dizziness before?" If so, ask: "When was the last time?" "What happened that time?"     Recurrent issue for 8 weeks 10. OTHER SYMPTOMS: "Do you have any other symptoms?" (e.g., fever, chest pain, vomiting, diarrhea, bleeding)       No 11. PREGNANCY: "Is there any chance you are pregnant?" "When was your last menstrual period?"       N/a  Protocols used: DIZZINESS [facility]

## 2017-11-05 NOTE — Telephone Encounter (Signed)
Patient notified as instructed by telephone and verbalized understanding. Patient stated that he has already taken his Verapamil today. Patient stated that he felt good Monday and yesterday after he went outside and worked he started feeling bad again. Patient stated that he will skip his Verapamil tomorrow and will keep an eye on his blood pressure and will update Dr. Damita Dunnings Friday or early Monday. Advised patient to call back if he has any problems in the meantime.

## 2017-11-05 NOTE — Telephone Encounter (Signed)
Or have a person at [facility] triage this and see what details you can get.  Thanks.

## 2017-11-05 NOTE — Progress Notes (Signed)
  Per orders of Dr. Damita Dunnings, injection of B12 given by Lurlean Nanny. Patient tolerated injection well.

## 2017-11-05 NOTE — Telephone Encounter (Signed)
Would skip a dose of verapamil. Drink plenty of fluids, check his BP and update me tomorrow.  Thanks.

## 2017-11-05 NOTE — Telephone Encounter (Signed)
See other phone note from [place].

## 2017-11-05 NOTE — Telephone Encounter (Signed)
Pt wanted dr Damita Dunnings to call him about his dizziness.  He stated he is not any better.  Transfer to [facility] to have them triage pt.

## 2017-11-06 NOTE — Telephone Encounter (Signed)
Thanks.  Will await update from patient.

## 2017-11-10 ENCOUNTER — Ambulatory Visit: Payer: Medicare Other | Admitting: Family Medicine

## 2017-11-10 ENCOUNTER — Encounter: Payer: Self-pay | Admitting: Family Medicine

## 2017-11-10 VITALS — BP 146/80 | HR 65 | Temp 97.7°F | Ht 71.0 in | Wt 235.0 lb

## 2017-11-10 DIAGNOSIS — I10 Essential (primary) hypertension: Secondary | ICD-10-CM

## 2017-11-10 DIAGNOSIS — Z638 Other specified problems related to primary support group: Secondary | ICD-10-CM

## 2017-11-10 DIAGNOSIS — E538 Deficiency of other specified B group vitamins: Secondary | ICD-10-CM

## 2017-11-10 MED ORDER — SERTRALINE HCL 25 MG PO TABS: 25.0000 mg | ORAL_TABLET | Freq: Every day | ORAL | 1 refills | Status: DC

## 2017-11-10 MED ORDER — CYANOCOBALAMIN 1000 MCG/ML IJ SOLN
1000.0000 ug | Freq: Once | INTRAMUSCULAR | Status: AC
  Administered 2017-11-10: 1000 ug via INTRAMUSCULAR

## 2017-11-10 NOTE — Progress Notes (Signed)
He is due for B12 shot this week, d/w pt.  He'll schedule f/u injection.    No severe room spinning.  No vertigo sx in the last few weeks except when he has some sensation of motion with laying on his side/laying down.  This may still be vertigo but less severe than prev episodes.    His walking is better.  Unclear if from B12.  Has been better in the last few weeks.    He was prev lightheaded.  He stopped verapamil for 3 nights, then took a dose last night.   DBP was usually in the 90s when off med.  Lower BP today, but with dose of med last night.  He didn't feel better off verapamil.  We talked about symptoms possibly related to flomax.  He stopped flomax last night.    Last Tuesday, when he felt better, he may have "overdone it" with yard work.  We talked about staying hydrated and exerting himself within reason.  We talked about his situation with caregiver strain.  He was asking for as needed medicine that he could use when he got anxious.  Discussed with him about options.  I am worried about starting him on an antihistamine given his history of urinary symptoms.  I do not want to use a benzodiazepine given fall risk.  Rationale discussed with patient.  He may be a better candidate for daily SSRI at a low dose.  Discussed.  No suicidal or homicidal intent.  Meds, vitals, and allergies reviewed.   ROS: Per HPI unless specifically indicated in ROS section   GEN: nad, alert and oriented HEENT: mucous membranes moist NECK: supple w/o LA CV: rrr.  PULM: ctab, no inc wob ABD: soft, +bs EXT: no edema SKIN: no acute rash

## 2017-11-10 NOTE — Patient Instructions (Signed)
Stay off the flomax for now and see if that helps.  Update me at the end of the week or early next week.  If you have a lot of trouble urinating in the meantime then let me know.  Try taking sertraline in the meantime and see if that helps with the stressful situations at home.  Continue verapamil for now.  Take care.  Glad to see you.

## 2017-11-11 NOTE — Assessment & Plan Note (Signed)
He did not feel better off the verapamil.  It may be that the Flomax was the issue.  He will stop the Flomax for now.  If he has more urinary symptoms he will let me know.  He will update me at the end of the week early next week.  Okay for outpatient follow-up. >25 minutes spent in face to face time with patient, >50% spent in counselling or coordination of care.

## 2017-11-11 NOTE — Assessment & Plan Note (Signed)
Continue B12 replacement.  He is walking better.  This appears to be the main intervention that is improved his gait.

## 2017-11-11 NOTE — Assessment & Plan Note (Signed)
We talked about his situation with caregiver strain.  He was asking for as needed medicine that he could use when he got anxious.  Discussed with him about options.  I am worried about starting him on an antihistamine given his history of urinary symptoms.  I do not want to use a benzodiazepine given fall risk.  Rationale discussed with patient.  He may be a better candidate for daily SSRI at a low dose.  Discussed.  No suicidal or homicidal intent.  Start sertraline.  D/w pt.

## 2017-11-12 ENCOUNTER — Telehealth: Payer: Self-pay

## 2017-11-12 ENCOUNTER — Ambulatory Visit: Payer: Medicare Other

## 2017-11-12 ENCOUNTER — Ambulatory Visit: Payer: Self-pay | Admitting: *Deleted

## 2017-11-12 MED ORDER — DOXAZOSIN MESYLATE 1 MG PO TABS: 1.0000 mg | ORAL_TABLET | Freq: Every day | ORAL | 3 refills | Status: DC

## 2017-11-12 NOTE — Telephone Encounter (Addendum)
Start doxazosin instead of flomax.  Start with 1 mg a day.  Increase to 2mg  a day if needed.   While he makes the change, then stop the verapamil temporarily.  I don't want him to get lightheaded from a combination of doxazosin and verapamil.  See if he feels better off the flomax and update me as needed.   Thanks.

## 2017-11-12 NOTE — Telephone Encounter (Signed)
Spoken and notified patient's daughter [name] on [place]) of Dr Josefine Class comments. Patient's daughter verbalized understanding.

## 2017-11-12 NOTE — Addendum Note (Signed)
Addended by: Tonia Ghent on: 11/12/2017 02:04 PM   Modules accepted: Orders

## 2017-11-12 NOTE — Telephone Encounter (Signed)
Pt called to update Dr. Damita Dunnings on how he is doing without the Flomax. Pt states  he is having difficulty getting started urinating. He said that he was taking off the Flomax because of having problems with dizziness. He wants Dr. Damita Dunnings to try him on another medication for his urinary symptoms. It takes him a little while to get started and then he dribbles. He denies pain, fever or blood in urine. He is requesting a call back. Will notify flow at [facility].   Reason for Disposition . Urination is difficult to start (i.e., hesitancy) or straining  Answer Assessment - Initial Assessment Questions 1. SYMPTOM: "What's the main symptom you're concerned about?" (e.g., frequency, incontinence)     Can not urinate 2. ONSET: "When did the  ________  start?"    Been going on and now worst 3. PAIN: "Is there any pain?" If so, ask: "How bad is it?" (Scale: 1-10; mild, moderate, severe)     no 4. CAUSE: "What do you think is causing the symptoms?"     Stopping the flomax 5. OTHER SYMPTOMS: "Do you have any other symptoms?" (e.g., fever, flank pain, blood in urine, pain with urination)     no  Protocols used: URINARY [facility]

## 2017-11-12 NOTE — Telephone Encounter (Signed)
See [facility] nurse triage note 11/12/17.

## 2017-11-12 NOTE — Telephone Encounter (Signed)
Copied from [place] [id number]. Topic: Inquiry >> Nov 12, 2017  8:12 AM Pricilla Handler wrote: Reason for CRM: Patient called to let Dr. Damita Dunnings know that he is now having trouble urinating. Patient states that Dr. Damita Dunnings wanted the patient to call the office if lack of urinating became an issue. Please call patient.       Thank You!!!

## 2017-11-19 ENCOUNTER — Ambulatory Visit: Payer: Medicare Other

## 2017-12-02 ENCOUNTER — Inpatient Hospital Stay: Admission: RE | Admit: 2017-12-02 | Payer: Medicare Other | Source: Ambulatory Visit

## 2017-12-02 ENCOUNTER — Other Ambulatory Visit: Payer: Self-pay | Admitting: *Deleted

## 2017-12-02 ENCOUNTER — Other Ambulatory Visit (INDEPENDENT_AMBULATORY_CARE_PROVIDER_SITE_OTHER): Payer: Medicare Other

## 2017-12-02 ENCOUNTER — Other Ambulatory Visit: Payer: Medicare Other

## 2017-12-02 DIAGNOSIS — R42 Dizziness and giddiness: Secondary | ICD-10-CM

## 2017-12-02 DIAGNOSIS — I10 Essential (primary) hypertension: Secondary | ICD-10-CM | POA: Diagnosis not present

## 2017-12-02 DIAGNOSIS — Z8249 Family history of ischemic heart disease and other diseases of the circulatory system: Secondary | ICD-10-CM

## 2017-12-02 DIAGNOSIS — R260 Ataxic gait: Secondary | ICD-10-CM

## 2017-12-02 LAB — BUN: BUN: 8 mg/dL (ref 6–23)

## 2017-12-02 LAB — CREATININE, SERUM: Creatinine, Ser: 0.88 mg/dL (ref 0.40–1.50)

## 2017-12-02 MED ORDER — PREDNISONE 50 MG PO TABS: ORAL_TABLET | ORAL | 0 refills | Status: DC

## 2017-12-02 MED ORDER — DIPHENHYDRAMINE HCL 50 MG PO TABS: ORAL_TABLET | ORAL | 0 refills | Status: DC

## 2017-12-05 ENCOUNTER — Ambulatory Visit (HOSPITAL_COMMUNITY)
Admission: RE | Admit: 2017-12-05 | Discharge: 2017-12-05 | Disposition: A | Discharge status: Home / Self Care | Payer: Medicare Other | Source: Ambulatory Visit | Attending: Neurology | Admitting: Neurology

## 2017-12-05 ENCOUNTER — Encounter (HOSPITAL_COMMUNITY): Payer: Self-pay

## 2017-12-05 DIAGNOSIS — I7090 Unspecified atherosclerosis: Secondary | ICD-10-CM | POA: Diagnosis not present

## 2017-12-05 DIAGNOSIS — I771 Stricture of artery: Secondary | ICD-10-CM | POA: Diagnosis not present

## 2017-12-05 DIAGNOSIS — Z8249 Family history of ischemic heart disease and other diseases of the circulatory system: Secondary | ICD-10-CM | POA: Diagnosis not present

## 2017-12-05 DIAGNOSIS — R42 Dizziness and giddiness: Secondary | ICD-10-CM | POA: Diagnosis not present

## 2017-12-05 DIAGNOSIS — R260 Ataxic gait: Secondary | ICD-10-CM | POA: Insufficient documentation

## 2017-12-05 DIAGNOSIS — M5030 Other cervical disc degeneration, unspecified cervical region: Secondary | ICD-10-CM | POA: Insufficient documentation

## 2017-12-05 LAB — POCT I-STAT CREATININE: Creatinine, Ser: 0.9 mg/dL (ref 0.61–1.24)

## 2017-12-05 MED ORDER — IOPAMIDOL (ISOVUE-370) INJECTION 76%
50.0000 mL | Freq: Once | INTRAVENOUS | Status: AC | PRN
  Administered 2017-12-05: 50 mL via INTRAVENOUS

## 2017-12-05 MED ORDER — IOPAMIDOL (ISOVUE-370) INJECTION 76%
INTRAVENOUS | Status: AC
  Filled 2017-12-05: qty 50

## 2017-12-11 ENCOUNTER — Ambulatory Visit (INDEPENDENT_AMBULATORY_CARE_PROVIDER_SITE_OTHER): Payer: Medicare Other | Admitting: *Deleted

## 2017-12-11 ENCOUNTER — Encounter: Payer: Self-pay | Admitting: *Deleted

## 2017-12-11 ENCOUNTER — Telehealth: Payer: Self-pay | Admitting: *Deleted

## 2017-12-11 DIAGNOSIS — E538 Deficiency of other specified B group vitamins: Secondary | ICD-10-CM | POA: Diagnosis not present

## 2017-12-11 MED ORDER — CYANOCOBALAMIN 1000 MCG/ML IJ SOLN
1000.0000 ug | Freq: Once | INTRAMUSCULAR | Status: AC
  Administered 2017-12-11: 1000 ug via INTRAMUSCULAR

## 2017-12-11 NOTE — Telephone Encounter (Signed)
Called and left message for Tammy giving her the results and instructions.  Will call her next week with any further instructions.

## 2017-12-11 NOTE — Telephone Encounter (Signed)
-----   Message from Pieter Partridge, DO sent at 12/09/2017  9:25 AM EDT ----- Reviewed CTA of head and neck.  It shows some chronic changes in the blood vessels such as plaque build up but no aneurysms or cause for dizziness.  Arteries are open.  Further recommendations pending Dr. Serita Grit return to office.

## 2017-12-11 NOTE — Progress Notes (Signed)
Per orders of Dr. Duncan injection of Vitamin B12 given by Arend Bahl Simpson. Patient tolerated injection well.  

## 2017-12-18 ENCOUNTER — Telehealth: Payer: Self-pay | Admitting: *Deleted

## 2017-12-18 NOTE — Telephone Encounter (Signed)
I called Tammy (daughter) and left a message that we would like to see patient back on October 28 at 11:15.  Appointment card mailed.

## 2017-12-18 NOTE — Telephone Encounter (Signed)
-----   Message from Alda Berthold, DO sent at 12/16/2017 11:54 AM EDT ----- Please reassure patient that I do not see anything worrisome causing his dizziness based on his imaging.  Symptoms may be due to B12 deficiency and would take time to improve.  Return to clinic in 3 months, call sooner, if symptoms get worse.

## 2017-12-21 ENCOUNTER — Encounter: Payer: Self-pay | Admitting: Neurology

## 2017-12-24 ENCOUNTER — Encounter: Payer: Self-pay | Admitting: Neurology

## 2017-12-24 ENCOUNTER — Ambulatory Visit: Payer: Medicare Other | Admitting: Neurology

## 2017-12-24 VITALS — BP 110/78 | HR 58 | Ht 71.0 in | Wt 236.0 lb

## 2017-12-24 DIAGNOSIS — H9213 Otorrhea, bilateral: Secondary | ICD-10-CM | POA: Diagnosis not present

## 2017-12-24 DIAGNOSIS — H8111 Benign paroxysmal vertigo, right ear: Secondary | ICD-10-CM

## 2017-12-24 MED ORDER — MECLIZINE HCL 12.5 MG PO TABS: 12.5000 mg | ORAL_TABLET | Freq: Two times a day (BID) | ORAL | 0 refills | Status: DC | PRN

## 2017-12-24 NOTE — Patient Instructions (Addendum)
Start meclizine 12.5mg  twice daily  Continue home vestibular therapy  We will refer you to see Dr. Thornell Mule with ENT

## 2017-12-24 NOTE — Progress Notes (Signed)
Follow-up Visit   Date: 12/24/17    Richard King MRN: [id number] DOB: 1941/03/12   Interim History: Richard King is a 77 y.o. right-handed Caucasian male with hypertension, BPH, history of bladder cancer, and vertigo returning to the clinic for follow-up of dizziness.  The patient was accompanied to the clinic by self.  History of present illness: Starting on April 16th, he developed spell of vertigo with nausea, which lasted about a week.  He did well for a week, and then it returned again.  He tried his own Epley's maneuver at home which slowly improved his symptoms over 1-2 weeks.  On 5/10, he woke up with acute onset of lightheadedness without spinning/dizziness.  He recalls that his balance was very difficult and was holding onto someone to be able to walk. He went to the ER on 5/10, where MRI brain did not show show acute stroke.  He was also found to have vitamin B12 deficiency and is getting weekly injections x 4 weeks, then monthly.  He symptoms slowly improved but did not completely resolve and very different to his prior vertigo spells.  Symptoms are not constant and tend to occur daily, worse in the morning, lasting several hours.  Neck flexion and extension tends to trigger his lightheadedness. He feels that after being on his tractor or riding mower for a long time, his symptoms are worse.   He also complains of drainage from both ears and stuffed sensation, which alternates between each ear. He has seen ENT for this in the past.   UPDATE 12/24/2017: He is here for follow-up visit. He is frustrated that his dizziness has not resolved and he continues to have brief spells multiple times a day, always triggered by positional changes such as bending over, looking up or down, or rolling over in bed. He has tried doing Epley's maneuver which can help sometimes.  He also complains of stuffiness of the left ear and bilateral drainage from the ears.  He denies any ear pain, bloody  discharge, or tinnitus.  He has a dull headache since symptoms have been worse, pain is not severe to treat the pain.  He would like to see Dr. Thornell Mule for his ongoing ear drainage.    Medications:  Current Outpatient Medications on File Prior to Visit  Medication Sig Dispense Refill  . cyanocobalamin (,VITAMIN B-12,) 1000 MCG/ML injection 1067mcg IM weekly x4 doses then monthly    . diphenhydrAMINE (BENADRYL) 50 MG tablet Take one tablet 1 hour prior to test. 1 tablet 0  . doxazosin (CARDURA) 1 MG tablet Take 1-2 tablets (1-2 mg total) by mouth daily. 60 tablet 3  . fluticasone (FLONASE) 50 MCG/ACT nasal spray Place 2 sprays into both nostrils daily. 16 g 1  . sertraline (ZOLOFT) 25 MG tablet Take 1 tablet (25 mg total) by mouth daily. 90 tablet 1   No current facility-administered medications on file prior to visit.     Allergies:  Allergies  Allergen Reactions  . Ivp Dye [Iodinated Diagnostic Agents] Swelling  . Codeine     Review of Systems:  CONSTITUTIONAL: No fevers, chills, night sweats, or weight loss.  EYES: No visual changes or eye pain ENT: No hearing changes.  No history of nose bleeds.  +ear drainage +dizziness RESPIRATORY: No cough, wheezing and shortness of breath.   CARDIOVASCULAR: Negative for chest pain, and palpitations.   GI: Negative for abdominal discomfort, blood in stools or black stools.  No recent change in  bowel habits.   GU:  No history of incontinence.   MUSCLOSKELETAL: No history of joint pain or swelling.  No myalgias.   SKIN: Negative for lesions, rash, and itching.   ENDOCRINE: Negative for cold or heat intolerance, polydipsia or goiter.   PSYCH:  No depression or anxiety symptoms.   NEURO: As Above.   Vital Signs:  BP 110/78   Pulse (!) 58   Ht 5\' 11"  (1.803 m)   Wt 236 lb (107 kg)   SpO2 94%   BMI 32.92 kg/m      General Medical Exam:   General:  Well appearing, comfortable  Eyes/ENT: see cranial nerve examination.  Dix Hallpike positive  on the right only  Neck: No masses appreciated.  Full range of motion without tenderness.  No carotid bruits. Respiratory:  Clear to auscultation, good air entry bilaterally.   Cardiac:  Regular rate and rhythm, no murmur.   Ext:  No edema  Neurological Exam: MENTAL STATUS including orientation to time, place, person, recent and remote memory, attention span and concentration, language, and fund of knowledge is normal.  Speech is not dysarthric.  CRANIAL NERVES:  Pupils equal round and reactive to light.  Normal conjugate, extra-ocular eye movements in all directions of gaze.  There is fatigable nystagmus with right gaze.  No nystagmus with left gaze. No ptosis.  Face is symmetric. Palate elevates symmetrically.  Tongue is midline.    MOTOR:  Motor strength is 5/5 in all extremities.  No atrophy, fasciculations or abnormal movements.  No pronator drift.  Tone is normal.    COORDINATION/GAIT:  Normal finger-to- nose-finger. Intact rapid alternating movements bilaterally.  Gait narrow based and stable.   Data: MRI brain 10/10/2017:   1. No acute intracranial abnormality identified. 2. Mild brain parenchymal volume loss for age.  CTA head and neck 12/05/2017: 1. Minimal head and neck atherosclerosis with no arterial stenosis. 2. Generalized arterial tortuosity/ectasia. This is most pronounced at the great vessels which have a kinked appearance at the thoracic inlet. 3. Negative for intracranial aneurysm. There is mild ectasia at the anterior communicating artery-right ACA junction. 4. No acute intracranial abnormality. CT appearance of the brain is stable to the May MRI. 5. Moderate cervical spine degeneration including facet arthropathy related to C4-C5 mild anterolisthesis and lower cervical disc and endplate degeneration.   IMPRESSION/PLAN: 1.  Benign paroxysmal positional vertigo, symptoms are less intense however the frequency remains unchanged.  His prior evaluation has included MRI  brain, CTA head and neck (due to family history of cerebral aneurysm) which is non diagnostic.  - Start meclizine 12.5mg  BID, titrated as needed  - He was offered out-patient vestibular exercises, however, prefers to continue Epley's maneuver at home  2.  Bilateral ear drainage  - Referral to ENT, patient requesting Dr. Thornell Mule who he has seen in the past   Thank you for allowing me to participate in patient's care.  If I can answer any additional questions, I would be pleased to do so.    Sincerely,    Giovanny Dugal K. Posey Pronto, DO

## 2017-12-31 ENCOUNTER — Ambulatory Visit: Payer: Medicare Other | Admitting: Neurology

## 2017-12-31 ENCOUNTER — Encounter

## 2018-01-02 ENCOUNTER — Encounter: Payer: Self-pay | Admitting: Neurology

## 2018-01-06 ENCOUNTER — Encounter: Payer: Self-pay | Admitting: *Deleted

## 2018-01-06 ENCOUNTER — Telehealth: Payer: Self-pay | Admitting: *Deleted

## 2018-01-06 NOTE — Telephone Encounter (Signed)
Opened in error

## 2018-01-14 ENCOUNTER — Ambulatory Visit (INDEPENDENT_AMBULATORY_CARE_PROVIDER_SITE_OTHER): Payer: Medicare Other | Admitting: *Deleted

## 2018-01-14 DIAGNOSIS — E538 Deficiency of other specified B group vitamins: Secondary | ICD-10-CM

## 2018-01-14 MED ORDER — CYANOCOBALAMIN 1000 MCG/ML IJ SOLN
1000.0000 ug | Freq: Once | INTRAMUSCULAR | Status: AC
  Administered 2018-01-14: 1000 ug via INTRAMUSCULAR

## 2018-01-14 NOTE — Progress Notes (Signed)
Per orders of Dr. Tower, injection of b12 given by Stephaniemarie Stoffel H. Patient tolerated injection well.  

## 2018-02-18 ENCOUNTER — Ambulatory Visit (INDEPENDENT_AMBULATORY_CARE_PROVIDER_SITE_OTHER): Payer: Medicare Other

## 2018-02-18 DIAGNOSIS — E538 Deficiency of other specified B group vitamins: Secondary | ICD-10-CM | POA: Diagnosis not present

## 2018-02-18 MED ORDER — CYANOCOBALAMIN 1000 MCG/ML IJ SOLN
1000.0000 ug | Freq: Once | INTRAMUSCULAR | Status: AC
  Administered 2018-02-18: 1000 ug via INTRAMUSCULAR

## 2018-02-18 NOTE — Progress Notes (Addendum)
Per orders of Dr. Damita Dunnings, injection of Vitamin B12 given by Lurlean Nanny. Patient tolerated injection well.  I reviewed health advisor's note, was available for consultation, and agree with documentation and plan. Loura Pardon MD

## 2018-03-06 ENCOUNTER — Encounter: Payer: Self-pay | Admitting: Family Medicine

## 2018-03-06 ENCOUNTER — Ambulatory Visit: Payer: Medicare Other | Admitting: Family Medicine

## 2018-03-06 DIAGNOSIS — C679 Malignant neoplasm of bladder, unspecified: Secondary | ICD-10-CM

## 2018-03-06 DIAGNOSIS — I10 Essential (primary) hypertension: Secondary | ICD-10-CM | POA: Diagnosis not present

## 2018-03-06 DIAGNOSIS — E538 Deficiency of other specified B group vitamins: Secondary | ICD-10-CM

## 2018-03-06 LAB — VITAMIN B12: Vitamin B-12: 203 pg/mL — ABNORMAL LOW (ref 211–911)

## 2018-03-06 NOTE — Progress Notes (Signed)
B12 def.  He is on monthly tx now.  Reasonable to recheck routine B12 level today.  Seen at ENT clinic last week.  Was told BP was up.  Unrecalled SBP, DBP ~90 per patient report.  He has some positional, reproduceable vertigo when laying on his side, I'll defer to ENT about that.  Pt agrees.   He saw Dr. Karsten Ro yesterday, I am awaiting notes.  He was told his cystoscopy was normal except for a clot near the prostate.  "He didn't think it was a bladder problem."   He was told his BP was "high" at urology office.  Here today for recheck re: BP.    He has been taking 1mg  doxazosin daily.  No CP, BLE edema.  Not more SOB but "I used to smoke", so he expected some level of shortness of breath at baseline.  Compliant with med.  Rarely can be lightheaded for a second or two, occ noted in the AM.  He takes doxazosin in the PM.    Meds, vitals, and allergies reviewed.   ROS: Per HPI unless specifically indicated in ROS section   GEN: nad, alert and oriented HEENT: mucous membranes moist NECK: supple w/o LA CV: rrr. PULM: ctab, no inc wob ABD: soft, +bs EXT: no edema SKIN: no acute rash

## 2018-03-06 NOTE — Patient Instructions (Addendum)
Go to the lab on the way out to get your B12 level checked.  We'll contact you with your lab report. I'll await the notes from ENT and urology.  In the meantime, skip a dose of doxazosin tonight.  Start taking 1 mg in the morning starting tomorrow morning.  If your BP is still persistently >150/>90 after about 5 days, then try taking 2mg  of doxazosin in the mornings.  If you get lightheaded, then cut back on the dose.  Update me as needed.  Take care.  Glad to see you.

## 2018-03-08 ENCOUNTER — Other Ambulatory Visit: Payer: Self-pay | Admitting: Family Medicine

## 2018-03-08 DIAGNOSIS — E538 Deficiency of other specified B group vitamins: Secondary | ICD-10-CM

## 2018-03-08 MED ORDER — CYANOCOBALAMIN 1000 MCG/ML IJ SOLN: INTRAMUSCULAR | Status: DC

## 2018-03-08 NOTE — Assessment & Plan Note (Addendum)
History of, per urology.  I will await urology notes.

## 2018-03-08 NOTE — Assessment & Plan Note (Signed)
See notes on labs. 

## 2018-03-08 NOTE — Assessment & Plan Note (Signed)
I'll await the notes from ENT and urology.  In the meantime, skip a dose of doxazosin tonight.  Start taking 1 mg in the morning starting tomorrow morning.  If BP is still persistently >150/>90 after about 5 days, then try taking 2mg  of doxazosin in the mornings.  If you get lightheaded, then cut back on the dose.  He will update me as needed.  Okay for outpatient follow-up.

## 2018-03-25 ENCOUNTER — Ambulatory Visit (INDEPENDENT_AMBULATORY_CARE_PROVIDER_SITE_OTHER): Payer: Medicare Other | Admitting: *Deleted

## 2018-03-25 DIAGNOSIS — E538 Deficiency of other specified B group vitamins: Secondary | ICD-10-CM | POA: Diagnosis not present

## 2018-03-25 MED ORDER — CYANOCOBALAMIN 1000 MCG/ML IJ SOLN
1000.0000 ug | Freq: Once | INTRAMUSCULAR | Status: AC
  Administered 2018-03-25: 1000 ug via INTRAMUSCULAR

## 2018-03-25 NOTE — Progress Notes (Signed)
Per orders of Dr. Letvak, injection of b12 given by Fajr Fife H. Patient tolerated injection well.  

## 2018-03-30 ENCOUNTER — Ambulatory Visit: Payer: Medicare Other | Admitting: Neurology

## 2018-03-30 ENCOUNTER — Encounter

## 2018-04-08 ENCOUNTER — Ambulatory Visit (INDEPENDENT_AMBULATORY_CARE_PROVIDER_SITE_OTHER): Payer: Medicare Other

## 2018-04-08 DIAGNOSIS — E538 Deficiency of other specified B group vitamins: Secondary | ICD-10-CM | POA: Diagnosis not present

## 2018-04-08 MED ORDER — CYANOCOBALAMIN 1000 MCG/ML IJ SOLN
1000.0000 ug | Freq: Once | INTRAMUSCULAR | Status: AC
Start: 2018-04-08 — End: 2018-04-08
  Administered 2018-04-08: 1000 ug via INTRAMUSCULAR

## 2018-04-08 NOTE — Progress Notes (Signed)
Per orders of Dr. Damita Dunnings, Richard King, injection of Vitamin B12 given by Lurlean Nanny. Patient tolerated injection well.

## 2018-04-10 NOTE — Telephone Encounter (Signed)
See 11/12/17 triage note.

## 2018-04-15 ENCOUNTER — Ambulatory Visit (INDEPENDENT_AMBULATORY_CARE_PROVIDER_SITE_OTHER): Payer: Medicare Other

## 2018-04-15 DIAGNOSIS — E538 Deficiency of other specified B group vitamins: Secondary | ICD-10-CM | POA: Diagnosis not present

## 2018-04-15 MED ORDER — CYANOCOBALAMIN 1000 MCG/ML IJ SOLN
1000.0000 ug | Freq: Once | INTRAMUSCULAR | Status: AC
  Administered 2018-04-15: 1000 ug via INTRAMUSCULAR

## 2018-04-15 NOTE — Progress Notes (Signed)
Patient in office today for weekly B12 injection. Tolerated injection well to right deltoid.

## 2018-04-22 ENCOUNTER — Ambulatory Visit (INDEPENDENT_AMBULATORY_CARE_PROVIDER_SITE_OTHER): Payer: Medicare Other | Admitting: *Deleted

## 2018-04-22 DIAGNOSIS — E538 Deficiency of other specified B group vitamins: Secondary | ICD-10-CM | POA: Diagnosis not present

## 2018-04-22 MED ORDER — CYANOCOBALAMIN 1000 MCG/ML IJ SOLN
1000.0000 ug | Freq: Once | INTRAMUSCULAR | Status: AC
  Administered 2018-04-22: 1000 ug via INTRAMUSCULAR

## 2018-04-22 NOTE — Progress Notes (Signed)
Per orders of Dr. Duncan injection of Vitamin B12 given by Zafar Debrosse Simpson. Patient tolerated injection well.  

## 2018-04-29 ENCOUNTER — Ambulatory Visit: Payer: Medicare Other

## 2018-05-12 ENCOUNTER — Ambulatory Visit (INDEPENDENT_AMBULATORY_CARE_PROVIDER_SITE_OTHER): Payer: Medicare Other | Admitting: *Deleted

## 2018-05-12 ENCOUNTER — Other Ambulatory Visit: Payer: Self-pay | Admitting: Family Medicine

## 2018-05-12 DIAGNOSIS — E538 Deficiency of other specified B group vitamins: Secondary | ICD-10-CM

## 2018-05-12 MED ORDER — CYANOCOBALAMIN 1000 MCG/ML IJ SOLN
1000.0000 ug | Freq: Once | INTRAMUSCULAR | Status: AC
  Administered 2018-05-12: 1000 ug via INTRAMUSCULAR

## 2018-05-12 NOTE — Telephone Encounter (Signed)
This was last prescribed by Cherly Hensen, RN whom I'm assuming is with [facility].  Is it ok to fill this Rx?

## 2018-05-12 NOTE — Progress Notes (Signed)
Per orders of Dr. Duncan, injection of b12 given by Elia Nunley H. Patient tolerated injection well.  

## 2018-05-13 NOTE — Telephone Encounter (Signed)
Pt calling back and has been out of doxazosin for 2 days. Dorene Ar RN is with [facility]. Pt last seen 03/06/18.pt has been taking doxazosin 1 mg taking 2 tabs at hs for one month because BP was up. Pt said has vertigo since April but for the last month has not been as bad and pt has appt at [facility] on 02/16/18 to see doctor about vertigo. Pt will have BP checked then. Refilled per protocol to [facility]. Pt will cb if problem with BP. Pt not having any H/A, CP, SOB or much dizziness.

## 2018-05-13 NOTE — Telephone Encounter (Signed)
Thanks for sending this in. I was going to do it today.

## 2018-05-18 ENCOUNTER — Encounter: Payer: Self-pay | Admitting: Family Medicine

## 2018-08-20 ENCOUNTER — Other Ambulatory Visit (INDEPENDENT_AMBULATORY_CARE_PROVIDER_SITE_OTHER): Payer: Medicare Other

## 2018-08-20 ENCOUNTER — Other Ambulatory Visit: Payer: Self-pay

## 2018-08-20 ENCOUNTER — Ambulatory Visit: Payer: Medicare Other

## 2018-08-20 ENCOUNTER — Other Ambulatory Visit: Payer: Self-pay | Admitting: Family Medicine

## 2018-08-20 DIAGNOSIS — E538 Deficiency of other specified B group vitamins: Secondary | ICD-10-CM

## 2018-08-20 DIAGNOSIS — I10 Essential (primary) hypertension: Secondary | ICD-10-CM

## 2018-08-20 LAB — LIPID PANEL
Cholesterol: 163 mg/dL (ref 0–200)
HDL: 53.7 mg/dL (ref >39.00)
LDL Cholesterol: 95 mg/dL (ref 0–99)
NonHDL: 108.97
Total CHOL/HDL Ratio: 3
Triglycerides: 72 mg/dL (ref 0.0–149.0)
VLDL: 14.4 mg/dL (ref 0.0–40.0)

## 2018-08-20 LAB — COMPREHENSIVE METABOLIC PANEL
ALT: 17 U/L (ref 0–53)
AST: 17 U/L (ref 0–37)
Albumin: 4.2 g/dL (ref 3.5–5.2)
Alkaline Phosphatase: 80 U/L (ref 39–117)
BUN: 13 mg/dL (ref 6–23)
CO2: 29 mEq/L (ref 19–32)
Calcium: 9.4 mg/dL (ref 8.4–10.5)
Chloride: 107 mEq/L (ref 96–112)
Creatinine, Ser: 0.9 mg/dL (ref 0.40–1.50)
GFR: 81.57 mL/min (ref >60.00)
Glucose, Bld: 109 mg/dL — ABNORMAL HIGH (ref 70–99)
Potassium: 4.6 mEq/L (ref 3.5–5.1)
Sodium: 142 mEq/L (ref 135–145)
Total Bilirubin: 0.5 mg/dL (ref 0.2–1.2)
Total Protein: 6.8 g/dL (ref 6.0–8.3)

## 2018-08-20 LAB — VITAMIN B12: Vitamin B-12: 281 pg/mL (ref 211–911)

## 2018-08-24 ENCOUNTER — Encounter: Payer: Self-pay | Admitting: Family Medicine

## 2018-08-24 ENCOUNTER — Other Ambulatory Visit: Payer: Self-pay

## 2018-08-24 ENCOUNTER — Ambulatory Visit (INDEPENDENT_AMBULATORY_CARE_PROVIDER_SITE_OTHER): Payer: Medicare Other | Admitting: Family Medicine

## 2018-08-24 VITALS — BP 164/100 | HR 77 | Temp 97.8°F | Ht 71.0 in | Wt 236.2 lb

## 2018-08-24 DIAGNOSIS — Z638 Other specified problems related to primary support group: Secondary | ICD-10-CM

## 2018-08-24 DIAGNOSIS — R399 Unspecified symptoms and signs involving the genitourinary system: Secondary | ICD-10-CM

## 2018-08-24 DIAGNOSIS — H8111 Benign paroxysmal vertigo, right ear: Secondary | ICD-10-CM

## 2018-08-24 DIAGNOSIS — E538 Deficiency of other specified B group vitamins: Secondary | ICD-10-CM

## 2018-08-24 DIAGNOSIS — Z0000 Encounter for general adult medical examination without abnormal findings: Secondary | ICD-10-CM | POA: Diagnosis not present

## 2018-08-24 DIAGNOSIS — I10 Essential (primary) hypertension: Secondary | ICD-10-CM | POA: Diagnosis not present

## 2018-08-24 DIAGNOSIS — Z7189 Other specified counseling: Secondary | ICD-10-CM

## 2018-08-24 MED ORDER — CYANOCOBALAMIN 1000 MCG/ML IJ SOLN: INTRAMUSCULAR | Status: DC

## 2018-08-24 MED ORDER — DOXAZOSIN MESYLATE 1 MG PO TABS: 1.0000 mg | ORAL_TABLET | Freq: Every day | ORAL | 3 refills | Status: DC

## 2018-08-24 MED ORDER — VERAPAMIL HCL ER 120 MG PO TBCR: 120.0000 mg | EXTENDED_RELEASE_TABLET | Freq: Every day | ORAL | 3 refills | Status: DC

## 2018-08-24 MED ORDER — CYANOCOBALAMIN 1000 MCG/ML IJ SOLN
1000.0000 ug | Freq: Once | INTRAMUSCULAR | Status: AC
  Administered 2018-08-24: 1000 ug via INTRAMUSCULAR

## 2018-08-24 NOTE — Progress Notes (Signed)
I have personally reviewed the Medicare Annual Wellness questionnaire and have noted 1. The patient's medical and social history 2. Their use of alcohol, tobacco or illicit drugs 3. Their current medications and supplements 4. The patient's functional ability including ADL's, fall risks, home safety risks and hearing or visual             impairment. 5. Diet and physical activities 6. Evidence for depression or mood disorders  The patients weight, height, BMI have been recorded in the chart and visual acuity is per eye clinic.  I have made referrals, counseling and provided education to the patient based review of the above and I have provided the pt with a written personalized care plan for preventive services.  Provider list updated- see scanned forms.  Routine anticipatory guidance given to patient.  See health maintenance. The possibility exists that previously documented standard health maintenance information may have been brought forward from a previous encounter into this note.  If needed, that same information has been updated to reflect the current situation based on today's encounter.    Flu 04/2018 at pharmacy per patient report.  Shinglrix out of stock PNA UTD Tetanus d/w pt, may be cheaper at pharmacy Colonoscopy 2016 Prostate cancer screening and PSA options (with potential risks and benefits of testing vs not testing) were discussed along with recent recs/guidelines.  He declined testing PSA at this point. Advance directive d/w pt.  Daughter Rodena Piety or [name], to make health care decisions Would accept resuscitation but doesn't want prolonged interventions Cognitive function addressed- see scanned forms- and if abnormal then additional documentation follows.   LUTS.  He doesn't have as much nocturia but still with some daytime frequency.  On doxazosin 2mg , see below.  He was prev on verapamil, off med now.    B12 def.  B12 normal, not low, but lower than prev.  Had been off  replacement.  D/w pt.  He didn't feel "quite right" occ and he attributed that to the B12 level being lower .   Hypertension:    Using medication without problems or lightheadedness: no Chest pain with exertion:no Edema:no Short of breath:no BP elevation noted today.   Recheck 160/96.   Has been taking 2mg  doxazosin.    He has longstanding claustrophobia.  He has had 2 nightmares related to that.  No sx o/w.  Rare sx overall.  He has vivid dreams at baseline, since starting doxazosin.    Caregiver strain.  D/w pt.  His wife has memory loss, progressively worse.  He is able to manage the situation as is.  He'll update me as needed.  He has some help at home.    Dizziness with prev ENT and neuro eval done.  He hasn't had sx in the last 2 months.    PMH and SH reviewed  Meds, vitals, and allergies reviewed.   ROS: Per HPI.  Unless specifically indicated otherwise in HPI, the patient denies:  General: fever. Eyes: acute vision changes ENT: sore throat Cardiovascular: chest pain Respiratory: SOB GI: vomiting GU: dysuria Musculoskeletal: acute back pain Derm: acute rash Neuro: acute motor dysfunction Psych: worsening mood Endocrine: polydipsia Heme: bleeding Allergy: hayfever  GEN: nad, alert and oriented HEENT: mucous membranes moist NECK: supple w/o LA CV: rrr. PULM: ctab, no inc wob ABD: soft, +bs EXT: no edema SKIN: no acute rash

## 2018-08-24 NOTE — Patient Instructions (Addendum)
Restart verapamil 120mg  a day.  1 pill a day.  Cut the doxazosin back to 1 pill a day.  See if you can still urinate well enough with that.  Try to get a repeat B12 shot here in 2 weeks.  Recheck BP at that point.  Update me as needed in the meantime.   Tetanus shot may be cheaper at the pharmacy.   Plan on recheck B12 level in 6 months.   Take care.  Glad to see you.

## 2018-08-27 DIAGNOSIS — R399 Unspecified symptoms and signs involving the genitourinary system: Secondary | ICD-10-CM | POA: Insufficient documentation

## 2018-08-27 NOTE — Assessment & Plan Note (Addendum)
Would try to cut doxazosin back to 1mg  and see if tolerable with manageable LUTS with reasonable BP control.  Add back verapamil 120mg  a day.  He agrees. He'll update me as needed.  Labs d/w pt. V  He has vivid dreams at baseline, since starting doxazosin.  Will see if better on lower dose of doxazosin.

## 2018-08-27 NOTE — Assessment & Plan Note (Signed)
See above, restart replacement. He'll update me about how he feels on restart, if not feeling better.

## 2018-08-27 NOTE — Assessment & Plan Note (Signed)
Advance directive d/w pt.  Daughter Rodena Piety or [name], to make health care decisions Would accept resuscitation but doesn't want prolonged interventions

## 2018-08-27 NOTE — Assessment & Plan Note (Signed)
Apparently resolved, no recent sx.

## 2018-08-27 NOTE — Assessment & Plan Note (Signed)
Flu 04/2018 at pharmacy per patient report.  Shinglrix out of stock PNA UTD Tetanus d/w pt, may be cheaper at pharmacy Colonoscopy 2016 Prostate cancer screening and PSA options (with potential risks and benefits of testing vs not testing) were discussed along with recent recs/guidelines.  He declined testing PSA at this point. Advance directive d/w pt.  Daughter Rodena Piety or [name], to make health care decisions Would accept resuscitation but doesn't want prolonged interventions Cognitive function addressed- see scanned forms- and if abnormal then additional documentation follows.

## 2018-08-27 NOTE — Assessment & Plan Note (Signed)
Would try to cut doxazosin back to 1mg  and see if tolerable with manageable LUTS with reasonable BP control.  He agrees. He'll update me as needed.

## 2018-08-27 NOTE — Assessment & Plan Note (Signed)
D/w pt.  His wife has memory loss, progressively worse.  He is able to manage the situation as is.  He'll update me as needed.  He has some help at home.

## 2018-09-07 ENCOUNTER — Ambulatory Visit: Payer: Medicare Other | Admitting: Family Medicine

## 2018-09-07 ENCOUNTER — Other Ambulatory Visit: Payer: Self-pay

## 2018-09-07 ENCOUNTER — Ambulatory Visit (INDEPENDENT_AMBULATORY_CARE_PROVIDER_SITE_OTHER): Payer: Medicare Other

## 2018-09-07 VITALS — BP 128/70

## 2018-09-07 DIAGNOSIS — E538 Deficiency of other specified B group vitamins: Secondary | ICD-10-CM | POA: Diagnosis not present

## 2018-09-07 MED ORDER — CYANOCOBALAMIN 1000 MCG/ML IJ SOLN
1000.0000 ug | Freq: Once | INTRAMUSCULAR | Status: AC
  Administered 2018-09-07: 1000 ug via INTRAMUSCULAR

## 2018-09-07 NOTE — Progress Notes (Signed)
Pt came in for his bi-weekly B12 injection and a BP check.   BP was 128/70. States he is feeling well.

## 2018-09-07 NOTE — Progress Notes (Signed)
Noted. Thanks.  Would continue as is with BP meds and with B12 twice a month.

## 2018-09-23 ENCOUNTER — Other Ambulatory Visit: Payer: Self-pay

## 2018-09-23 ENCOUNTER — Ambulatory Visit (INDEPENDENT_AMBULATORY_CARE_PROVIDER_SITE_OTHER): Payer: Medicare Other

## 2018-09-23 DIAGNOSIS — E538 Deficiency of other specified B group vitamins: Secondary | ICD-10-CM

## 2018-09-23 MED ORDER — CYANOCOBALAMIN 1000 MCG/ML IJ SOLN
1000.0000 ug | Freq: Once | INTRAMUSCULAR | Status: AC
  Administered 2018-09-23: 1000 ug via INTRAMUSCULAR

## 2018-09-23 NOTE — Progress Notes (Signed)
Per orders of Dr. Gutierrez, injection of vit B12 given by Abdulraheem Pineo. Patient tolerated injection well.  

## 2018-09-24 ENCOUNTER — Telehealth: Payer: Self-pay | Admitting: Family Medicine

## 2018-09-24 ENCOUNTER — Ambulatory Visit: Payer: Self-pay

## 2018-09-24 NOTE — Telephone Encounter (Signed)
Pt called again asking about his B12 shot.

## 2018-09-24 NOTE — Telephone Encounter (Signed)
Patient is calling in regards to scheduling his B12 injection  He is not sure if he needs to have this done every 2 weeks or if Dr Damita Dunnings wants him to do every month now  When can we schedule him for this?

## 2018-09-24 NOTE — Telephone Encounter (Signed)
Please advise 

## 2018-09-24 NOTE — Telephone Encounter (Signed)
This encounter was created in error - please disregard.

## 2018-09-24 NOTE — Addendum Note (Signed)
Addended by: Corky Sox E on: 09/24/2018 04:10 PM   Modules accepted: Level of Service, SmartSet

## 2018-09-25 NOTE — Telephone Encounter (Signed)
Needs to be scheduled for B12 shots every 2 weeks/twice a month.  Thanks.

## 2018-09-25 NOTE — Telephone Encounter (Signed)
Patient advised and appointment scheduled. 

## 2018-10-07 ENCOUNTER — Ambulatory Visit (INDEPENDENT_AMBULATORY_CARE_PROVIDER_SITE_OTHER): Payer: Medicare Other

## 2018-10-07 DIAGNOSIS — E538 Deficiency of other specified B group vitamins: Secondary | ICD-10-CM

## 2018-10-07 MED ORDER — CYANOCOBALAMIN 1000 MCG/ML IJ SOLN
1000.0000 ug | Freq: Once | INTRAMUSCULAR | Status: AC
  Administered 2018-10-07: 1000 ug via INTRAMUSCULAR

## 2018-10-07 NOTE — Progress Notes (Signed)
Per orders of Dr.Gutierrez, injection of Vitamin B12 given by Diamond Nickel, RN. Patient tolerated injection well.  Note, patient insisted on injection being given in left arm today due to right arm injury.

## 2018-10-21 ENCOUNTER — Ambulatory Visit (INDEPENDENT_AMBULATORY_CARE_PROVIDER_SITE_OTHER): Payer: Medicare Other

## 2018-10-21 ENCOUNTER — Other Ambulatory Visit: Payer: Self-pay

## 2018-10-21 ENCOUNTER — Telehealth: Payer: Self-pay

## 2018-10-21 DIAGNOSIS — E538 Deficiency of other specified B group vitamins: Secondary | ICD-10-CM | POA: Diagnosis not present

## 2018-10-21 MED ORDER — CYANOCOBALAMIN 1000 MCG/ML IJ SOLN
1000.0000 ug | Freq: Once | INTRAMUSCULAR | Status: AC
  Administered 2018-10-21: 1000 ug via INTRAMUSCULAR

## 2018-10-21 NOTE — Progress Notes (Signed)
Per orders of Alma Friendly, NP, injection of Vitamin B12 given by Diamond Nickel, RN.  Administered to patient's left deltoid IM due to his insistence given irritation to Right upper arm from B12 given 4 weeks ago.  He states area is still itching.  I did assess the right arm injection site.  There is no redness, warmth or sign of infection. I do not 3-4 pinpoint scabbed areas where appears he has been scratching.    Provider notified (refer to phone encounter message from today 10/20/17).   Patient tolerated injection well.

## 2018-10-21 NOTE — Telephone Encounter (Signed)
Patient has refused B12 injection X last 2 visits to Right deltoid due to irritation to the area he states he received after 09/07/18 injection.   He said he initially called to report the irritation which was itching and uncomfortable and was told not to put anything on the area.  (I do not see a note regarding this).    I assessed the site today.  There is no redness, swelling, or signs of infection.  I do note 3-4 small scabbed areas where appears he has been scratching.  I told him to try applying ice to the area and suggested he may want to use an otc hydrocortisone cream (apply a thin layer) to the area as it has been several weeks since injection.  This may help with the itching.  The area is not that impressive but can make a visit with Dr. Damita Dunnings to evaluate if concerned since this is ongoing for several weeks.   I will forward this to Dr. Damita Dunnings for further advice and to see if he is in agreement with treatment plan.  We must start rotating sites at next visit in 2 weeks due to absorption and concern for tissue irritation with continued use of Left deltoid.  If Right deltoid is not better in 2 weeks can alternate using vastus lateralis or gluteal muscles.

## 2018-10-21 NOTE — Telephone Encounter (Signed)
I agree with all of that, rotation/topical tx/etc.  I can check the areas if needed.  Thanks.

## 2018-11-03 ENCOUNTER — Telehealth: Payer: Self-pay | Admitting: Family Medicine

## 2018-11-03 DIAGNOSIS — R21 Rash and other nonspecific skin eruption: Secondary | ICD-10-CM

## 2018-11-03 NOTE — Telephone Encounter (Signed)
Dr. Damita Dunnings please advise

## 2018-11-03 NOTE — Telephone Encounter (Signed)
Ordered. Thanks

## 2018-11-03 NOTE — Telephone Encounter (Signed)
Best number [phone] Pt called needing to get a referral to dr dasher  [facility].  He stated every time he calls he only get a recording  He did not leave message

## 2018-11-04 ENCOUNTER — Ambulatory Visit (INDEPENDENT_AMBULATORY_CARE_PROVIDER_SITE_OTHER): Payer: Medicare Other

## 2018-11-04 DIAGNOSIS — E538 Deficiency of other specified B group vitamins: Secondary | ICD-10-CM | POA: Diagnosis not present

## 2018-11-04 MED ORDER — CYANOCOBALAMIN 1000 MCG/ML IJ SOLN
1000.0000 ug | Freq: Once | INTRAMUSCULAR | Status: AC
  Administered 2018-11-04: 1000 ug via INTRAMUSCULAR

## 2018-11-04 NOTE — Progress Notes (Signed)
Per orders of Alma Friendly, NP for Dr. Damita Dunnings today due to his being out of the office, injection of Vitamin B12 given by Diamond Nickel, RN.  Administered to R deltoid IM.  Area not itching and completely clear of irritation, only 2 dry, healed pin point scabbed areas from scratching.  Injection not administered to that particular area of right deltoid.   Patient tolerated injection well.

## 2018-11-18 ENCOUNTER — Other Ambulatory Visit: Payer: Self-pay

## 2018-11-18 ENCOUNTER — Ambulatory Visit (INDEPENDENT_AMBULATORY_CARE_PROVIDER_SITE_OTHER): Payer: Medicare Other

## 2018-11-18 DIAGNOSIS — E538 Deficiency of other specified B group vitamins: Secondary | ICD-10-CM

## 2018-11-18 MED ORDER — CYANOCOBALAMIN 1000 MCG/ML IJ SOLN
1000.0000 ug | Freq: Once | INTRAMUSCULAR | Status: AC
Start: 2018-11-18 — End: 2018-11-18
  Administered 2018-11-18: 1000 ug via INTRAMUSCULAR

## 2018-11-18 NOTE — Progress Notes (Signed)
Per orders of Dr. Danise Mina filling in for Dr. Damita Dunnings who is out of the office today, injection of Vitamin B12 given by Diamond Nickel, RN.  Administered to Left deltoid IM.   Patient tolerated injection well.

## 2018-12-02 ENCOUNTER — Other Ambulatory Visit: Payer: Self-pay

## 2018-12-02 ENCOUNTER — Ambulatory Visit (INDEPENDENT_AMBULATORY_CARE_PROVIDER_SITE_OTHER): Payer: Medicare Other

## 2018-12-02 DIAGNOSIS — E538 Deficiency of other specified B group vitamins: Secondary | ICD-10-CM

## 2018-12-02 MED ORDER — CYANOCOBALAMIN 1000 MCG/ML IJ SOLN
1000.0000 ug | Freq: Once | INTRAMUSCULAR | Status: AC
  Administered 2018-12-02: 1000 ug via INTRAMUSCULAR

## 2018-12-02 NOTE — Progress Notes (Signed)
Pt given bi-monthly B12 injection in right deltoid. Pt tolerated well.

## 2018-12-16 ENCOUNTER — Ambulatory Visit (INDEPENDENT_AMBULATORY_CARE_PROVIDER_SITE_OTHER): Payer: Medicare Other

## 2018-12-16 DIAGNOSIS — E538 Deficiency of other specified B group vitamins: Secondary | ICD-10-CM

## 2018-12-16 MED ORDER — CYANOCOBALAMIN 1000 MCG/ML IJ SOLN
1000.0000 ug | Freq: Once | INTRAMUSCULAR | Status: AC
  Administered 2018-12-16: 1000 ug via INTRAMUSCULAR

## 2018-12-16 NOTE — Progress Notes (Signed)
Per orders of Dr. Gutierrez, injection of vit B12 given by Donie Moulton. Patient tolerated injection well.  

## 2018-12-30 ENCOUNTER — Ambulatory Visit (INDEPENDENT_AMBULATORY_CARE_PROVIDER_SITE_OTHER): Payer: Medicare Other

## 2018-12-30 DIAGNOSIS — E538 Deficiency of other specified B group vitamins: Secondary | ICD-10-CM | POA: Diagnosis not present

## 2018-12-30 MED ORDER — CYANOCOBALAMIN 1000 MCG/ML IJ SOLN
1000.0000 ug | Freq: Once | INTRAMUSCULAR | Status: AC
  Administered 2018-12-30: 1000 ug via INTRAMUSCULAR

## 2018-12-30 NOTE — Progress Notes (Signed)
Per orders of Dr. Leonard Downing sign by Dr. Darnell Level in his abscence, injection of B12 injection-gets it every 2 weeks right now given by Kris Mouton. Patient tolerated injection well.

## 2019-01-13 ENCOUNTER — Ambulatory Visit (INDEPENDENT_AMBULATORY_CARE_PROVIDER_SITE_OTHER): Payer: Medicare Other

## 2019-01-13 DIAGNOSIS — E538 Deficiency of other specified B group vitamins: Secondary | ICD-10-CM

## 2019-01-13 MED ORDER — CYANOCOBALAMIN 1000 MCG/ML IJ SOLN
1000.0000 ug | Freq: Once | INTRAMUSCULAR | Status: AC
  Administered 2019-01-13: 1000 ug via INTRAMUSCULAR

## 2019-01-13 NOTE — Progress Notes (Signed)
Per orders of Dr. Leonard Downing signed by Dr. Danise Mina in his absence injection of B12 given by Kris Mouton. Patient tolerated injection well.

## 2019-01-27 ENCOUNTER — Ambulatory Visit (INDEPENDENT_AMBULATORY_CARE_PROVIDER_SITE_OTHER): Payer: Medicare Other

## 2019-01-27 DIAGNOSIS — E538 Deficiency of other specified B group vitamins: Secondary | ICD-10-CM

## 2019-01-27 MED ORDER — CYANOCOBALAMIN 1000 MCG/ML IJ SOLN
1000.0000 ug | Freq: Once | INTRAMUSCULAR | Status: AC
  Administered 2019-01-27: 1000 ug via INTRAMUSCULAR

## 2019-01-27 NOTE — Progress Notes (Signed)
Per orders of Dr. Leonard Downing signed by Dr Glori Bickers in his absence, injection of B12-get this every 2 weeks given by Kris Mouton. Patient tolerated injection well.

## 2019-02-10 ENCOUNTER — Ambulatory Visit (INDEPENDENT_AMBULATORY_CARE_PROVIDER_SITE_OTHER): Payer: Medicare Other | Admitting: *Deleted

## 2019-02-10 DIAGNOSIS — E538 Deficiency of other specified B group vitamins: Secondary | ICD-10-CM | POA: Diagnosis not present

## 2019-02-10 MED ORDER — CYANOCOBALAMIN 1000 MCG/ML IJ SOLN
1000.0000 ug | Freq: Once | INTRAMUSCULAR | Status: AC
  Administered 2019-02-10: 1000 ug via INTRAMUSCULAR

## 2019-02-10 NOTE — Progress Notes (Signed)
Per orders of Dr. Danise Mina (for Dr Damita Dunnings), injection of B12 1000 mcg/mL given by Everard Interrante. Patient tolerated injection well.

## 2019-02-24 ENCOUNTER — Ambulatory Visit: Payer: Medicare Other

## 2019-02-25 ENCOUNTER — Ambulatory Visit (INDEPENDENT_AMBULATORY_CARE_PROVIDER_SITE_OTHER): Payer: Medicare Other

## 2019-02-25 DIAGNOSIS — E538 Deficiency of other specified B group vitamins: Secondary | ICD-10-CM

## 2019-02-25 MED ORDER — CYANOCOBALAMIN 1000 MCG/ML IJ SOLN
1000.0000 ug | Freq: Once | INTRAMUSCULAR | Status: AC
  Administered 2019-02-25: 1000 ug via INTRAMUSCULAR

## 2019-02-25 NOTE — Progress Notes (Signed)
Per orders of Dr. Damita Dunnings, injection of B12-getting this every 2 weeks given by Kris Mouton. Patient tolerated injection well.   Due for re check B12 at the end of October.

## 2019-03-04 ENCOUNTER — Other Ambulatory Visit: Payer: Self-pay | Admitting: Family Medicine

## 2019-03-08 ENCOUNTER — Other Ambulatory Visit: Payer: Self-pay

## 2019-03-08 ENCOUNTER — Telehealth: Payer: Self-pay | Admitting: Family Medicine

## 2019-03-08 DIAGNOSIS — Z20822 Contact with and (suspected) exposure to covid-19: Secondary | ICD-10-CM

## 2019-03-08 NOTE — Telephone Encounter (Signed)
Noted. Thanks.

## 2019-03-08 NOTE — Telephone Encounter (Signed)
I spoke with pt; Pt has no covid symptoms except low grade fever 99.7; 2 wks ago had S/T and prod cough with white phlegm for 1 wk ; then symptoms went away and this past weekend had cough and S/T but none today;pt had H/A on 03/06/19 but pt thinks stress caused that, no travel and no known exposure to + covid. Pt mostly wears mask if going anywhere in public but does not wear a mask when around family. Pt notified as instructed and pt will go to [facility] visitor entrance this afternoon to be covid tested. Pt will self quarantine until gets covid results. Pt also wanted to ck status of sertraline refill; advised pt refilled on 03/05/19 and pt will ck with [facility]. FYI to Dr Damita Dunnings.

## 2019-03-08 NOTE — Telephone Encounter (Signed)
Please triage patient.  I would err on the side of caution, ie would test patient for covid.  Thanks.

## 2019-03-08 NOTE — Telephone Encounter (Signed)
Patient would like some advice on what you believe he should do He called today about being covid tested. But he is not 100% sure if he needs it  Patient stated this weekend he had a slight sore throat but the next day it was gone. Then he had a cough but it has went away. His daughter checked in temp and it was 99.1 then 5 mins later when she checked it was 99.7 but patient said he had been out in the sun so he was not sure if they caused those temps.

## 2019-03-10 LAB — NOVEL CORONAVIRUS, NAA: SARS-CoV-2, NAA: NOT DETECTED

## 2019-03-10 NOTE — Telephone Encounter (Signed)
Opened in error

## 2019-03-30 ENCOUNTER — Ambulatory Visit: Payer: Medicare Other | Admitting: Family Medicine

## 2019-03-30 ENCOUNTER — Encounter: Payer: Self-pay | Admitting: Family Medicine

## 2019-03-30 ENCOUNTER — Ambulatory Visit (INDEPENDENT_AMBULATORY_CARE_PROVIDER_SITE_OTHER): Payer: Medicare Other | Admitting: Family Medicine

## 2019-03-30 ENCOUNTER — Other Ambulatory Visit: Payer: Self-pay

## 2019-03-30 VITALS — BP 140/76 | HR 76 | Temp 98.2°F | Ht 71.0 in | Wt 229.4 lb

## 2019-03-30 DIAGNOSIS — N400 Enlarged prostate without lower urinary tract symptoms: Secondary | ICD-10-CM

## 2019-03-30 DIAGNOSIS — E538 Deficiency of other specified B group vitamins: Secondary | ICD-10-CM

## 2019-03-30 DIAGNOSIS — R21 Rash and other nonspecific skin eruption: Secondary | ICD-10-CM | POA: Diagnosis not present

## 2019-03-30 MED ORDER — PREDNISONE 10 MG PO TABS: ORAL_TABLET | ORAL | 0 refills | Status: DC

## 2019-03-30 MED ORDER — CYANOCOBALAMIN 1000 MCG/ML IJ SOLN
1000.0000 ug | Freq: Once | INTRAMUSCULAR | Status: AC
  Administered 2019-03-30: 1000 ug via INTRAMUSCULAR

## 2019-03-30 MED ORDER — DOXAZOSIN MESYLATE 1 MG PO TABS: 1.0000 mg | ORAL_TABLET | Freq: Every day | ORAL | 3 refills | Status: DC

## 2019-03-30 NOTE — Patient Instructions (Signed)
Unclear source for the rash.  Start prednisone and update me if not better.  B12 shot today.  Defer the flu shot today since you are starting prednisone.   Repeat B12 shot in about 2 weeks.  You can get flu shot at that point.  Schedule on the way out.   Try taking 2 of the doxazosin tabs.  If lightheaded or if not urinating better, then cut back to 1 pill and let me know.   Take care.  Glad to see you.

## 2019-03-30 NOTE — Progress Notes (Signed)
Rash.  No new foods or meds or triggers.   Started last night.  On trunk and arms.  No lip or tongue swelling. No FCNAVD.  Took some benadryl last night.  No wheeze.  He can tolerate prednisone.    Prev ST resolved, prev covid neg, d/w pt.  Due for B12 injection.  He still has nocturia, in spite of doxazosin 1mg .  Cautions d/w pt. see plan.  Meds, vitals, and allergies reviewed.   ROS: Per HPI unless specifically indicated in ROS section   nad ncat Neck supple, no LA, no stridor. rrr ctab No wheeze. Diffuse blanching rash on the arms and trunk B w/o dermatomal distribution.  Less on the legs.  Not on palms.

## 2019-03-31 DIAGNOSIS — L559 Sunburn, unspecified: Secondary | ICD-10-CM | POA: Insufficient documentation

## 2019-03-31 DIAGNOSIS — R21 Rash and other nonspecific skin eruption: Secondary | ICD-10-CM | POA: Insufficient documentation

## 2019-03-31 NOTE — Assessment & Plan Note (Signed)
Unclear source for the rash.  Appears to be annoying but not ominous.  He could have had some environmental trigger to cause his current symptoms.  Discussed options.  Start prednisone and update me if not better.   Steroid cautions discussed with patient.   Defer flu shot today since he is starting prednisone.

## 2019-03-31 NOTE — Assessment & Plan Note (Signed)
B12 shot done today. Repeat B12 shot in about 2 weeks.  He can get flu shot at that point.  He can schedule on the way out.

## 2019-03-31 NOTE — Assessment & Plan Note (Signed)
With nocturia.  Discussed options.  He can try taking 2 of the doxazosin tabs.  If lightheaded or if not urinating better, then cut back to 1 pill and he will let me know.  He agrees to plan.

## 2019-04-08 ENCOUNTER — Telehealth: Payer: Self-pay | Admitting: Family Medicine

## 2019-04-08 NOTE — Telephone Encounter (Signed)
Patient advised.

## 2019-04-08 NOTE — Telephone Encounter (Signed)
Patient was seen last week for a rash.  Patient was given Prednisone.  Patient took his last pill this morning.  Dr. Damita Dunnings asked patient to call back if the rash wasn't better.  Patient said rash was better until last night.  Last night patient had welts and itching.  Patient took Benadryl and it cleared up the welts and itching.  Patient thinks it could possibly be caused by a medication he's taking.  Patient uses [facility] [employer]

## 2019-04-08 NOTE — Telephone Encounter (Signed)
Please call him back.  Continue prn benadryl in the meantime.  If he isn't on a new medicine, then it isn't likely that one of those would be the cause.  Please have him make sure he doesn't have any new foods or soaps or environmental triggers.  Thanks.

## 2019-04-14 ENCOUNTER — Ambulatory Visit (INDEPENDENT_AMBULATORY_CARE_PROVIDER_SITE_OTHER): Payer: Medicare Other

## 2019-04-14 ENCOUNTER — Telehealth: Payer: Self-pay | Admitting: Family Medicine

## 2019-04-14 DIAGNOSIS — Z23 Encounter for immunization: Secondary | ICD-10-CM

## 2019-04-14 DIAGNOSIS — E538 Deficiency of other specified B group vitamins: Secondary | ICD-10-CM

## 2019-04-14 MED ORDER — CYANOCOBALAMIN 1000 MCG/ML IJ SOLN
1000.0000 ug | Freq: Once | INTRAMUSCULAR | Status: AC
  Administered 2019-04-14: 1000 ug via INTRAMUSCULAR

## 2019-04-14 NOTE — Progress Notes (Signed)
Per orders of Dr. Elsie Stain, injection of B12 and flu shot given by Lurlean Nanny. Patient tolerated injection well.

## 2019-04-14 NOTE — Telephone Encounter (Signed)
Patient had his b-12 shot today and asked about getting the Shingrix shot.  Patient was told he'd need a rx for [facility].  Patient will check with his insurance to make sure that they'll pay.  Patient would, also, like to get his Tetanus shot. Patient can get the tetanus shot when he comes for his b-12 on 04/28/19.

## 2019-04-15 ENCOUNTER — Ambulatory Visit: Payer: Medicare Other

## 2019-04-16 MED ORDER — SHINGRIX 50 MCG/0.5ML IM SUSR: 0.5000 mL | Freq: Once | INTRAMUSCULAR | 1 refills | Status: AC

## 2019-04-16 NOTE — Telephone Encounter (Signed)
Patient advised.

## 2019-04-16 NOTE — Telephone Encounter (Signed)
Okay to do Td at visit for B12.  He should check with his insurance to make sure the Td is covered here.  It may be cheaper at the pharmacy. Prescription sent for Shingrix.  Thanks.

## 2019-04-28 ENCOUNTER — Ambulatory Visit: Payer: Medicare Other

## 2019-08-24 ENCOUNTER — Telehealth: Payer: Self-pay | Admitting: Family Medicine

## 2019-08-24 NOTE — Telephone Encounter (Signed)
Called and couldn't leave vm, but patient ended up calling back stating that he will call us to reschedule shortly.

## 2019-08-25 NOTE — Telephone Encounter (Signed)
Called patient and he stated that his daughter would have to bing him for his appointments and that he would have to call back to schedule once he knows her availability.

## 2019-08-25 NOTE — Telephone Encounter (Signed)
Awaiting call back from patient to schedule appt

## 2019-09-23 ENCOUNTER — Other Ambulatory Visit: Payer: Self-pay | Admitting: Family Medicine

## 2019-10-05 ENCOUNTER — Telehealth: Payer: Self-pay | Admitting: Family Medicine

## 2019-10-05 NOTE — Telephone Encounter (Signed)
Tried to leave message but mailbox was full. Appointment needs to be rescheduled.

## 2019-10-07 ENCOUNTER — Encounter: Payer: Medicare Other | Admitting: Family Medicine

## 2019-10-12 ENCOUNTER — Ambulatory Visit: Payer: Self-pay

## 2019-10-12 ENCOUNTER — Other Ambulatory Visit: Payer: Self-pay

## 2019-10-12 ENCOUNTER — Encounter: Payer: Self-pay | Admitting: Orthopaedic Surgery

## 2019-10-12 ENCOUNTER — Ambulatory Visit: Payer: Medicare PPO | Admitting: Orthopaedic Surgery

## 2019-10-12 VITALS — Ht 71.0 in | Wt 235.0 lb

## 2019-10-12 DIAGNOSIS — M25512 Pain in left shoulder: Secondary | ICD-10-CM | POA: Diagnosis not present

## 2019-10-12 DIAGNOSIS — M19012 Primary osteoarthritis, left shoulder: Secondary | ICD-10-CM | POA: Diagnosis not present

## 2019-10-12 MED ORDER — METHYLPREDNISOLONE ACETATE 40 MG/ML IJ SUSP
80.0000 mg | INTRAMUSCULAR | Status: AC | PRN
  Administered 2019-10-12: 80 mg via INTRA_ARTICULAR

## 2019-10-12 MED ORDER — LIDOCAINE HCL 2 % IJ SOLN
2.0000 mL | INTRAMUSCULAR | Status: AC | PRN
Start: 2019-10-12 — End: 2019-10-12
  Administered 2019-10-12: 2 mL

## 2019-10-12 MED ORDER — BUPIVACAINE HCL 0.5 % IJ SOLN
2.0000 mL | INTRAMUSCULAR | Status: AC | PRN
  Administered 2019-10-12: 2 mL via INTRA_ARTICULAR

## 2019-10-12 NOTE — Progress Notes (Signed)
Office Visit Note   Patient: Richard King           Date of Birth: 1940-08-03           MRN: [id number] Visit Date: 10/12/2019              Requested by: Tonia Ghent, MD [id number] [place],  [place] [address] PCP: Tonia Ghent, MD   Assessment & Plan: Visit Diagnoses:  1. Acute pain of left shoulder   2. Primary osteoarthritis, left shoulder     Plan: Primary osteoarthritis left shoulder.  Long discussion regarding his diagnosis and treatment options.  Has been pretty uncomfortable so I will inject the glenohumeral joint with Depo-Medrol and try a course of physical therapy.  Long discussion regarding primary shoulder replacement at some point in the future.  Will reevaluate in 1 month.  Significant improvement in pain after the injection  Follow-Up Instructions: Return in about 1 month (around 11/12/2019), or if symptoms worsen or fail to improve.   Orders:  Orders Placed This Encounter  Procedures  . Large Joint Inj: L subacromial bursa  . XR Shoulder Left  . Ambulatory referral to Physical Therapy   No orders of the defined types were placed in this encounter.     Procedures: Large Joint Inj: L subacromial bursa on 10/12/2019 2:44 PM Indications: pain and diagnostic evaluation Details: 25 G 1.5 in needle, anterolateral approach  Arthrogram: No  Medications: 2 mL lidocaine 2 %; 2 mL bupivacaine 0.5 %; 80 mg methylPREDNISolone acetate 40 MG/ML Consent was given by the patient. Immediately prior to procedure a time out was called to verify the correct patient, procedure, equipment, support staff and site/side marked as required. Patient was prepped and draped in the usual sterile fashion.       Clinical Data: No additional findings.   Subjective: Chief Complaint  Patient presents with  . Left Shoulder - Pain  Patient presents today for left shoulder pain. He said that it has hurt a little on and off for the last 4years. Four days ago his pain  got intense. He has limited range of motion. He has numbness and tingling in his left arm. He is taking Goody powders for pain. He is right hand dominant. He takes Tylenol PM to help with his sleep. No previous left shoulder surgery.   HPI  Review of Systems   Objective: Vital Signs: Ht 5\' 11"  (1.803 m)   Wt 235 lb (106.6 kg)   BMI 32.78 kg/m   Physical Exam Constitutional:      Appearance: He is well-developed.  Eyes:     Pupils: Pupils are equal, round, and reactive to light.  Pulmonary:     Effort: Pulmonary effort is normal.  Skin:    General: Skin is warm and dry.  Neurological:     Mental Status: He is alert and oriented to person, place, and time.  Psychiatric:        Behavior: Behavior normal.     Ortho Exam left shoulder with limited range of motion.  Difficult active flexion and abduction related to pain.  Only had about 15 degrees of external rotation.  Good grip and release.  Good strength.  Biceps intact.  After injection I was able to place the left arm overhead both passively and actively lacking only may be 20 degrees.  At full abduction.  Is able X externally rotate approximately 35 degrees with about 80 degrees of internal rotation.  Motor and sensory intact  Specialty Comments:  No specialty comments available.  Imaging: XR Shoulder Left  Result Date: 10/12/2019 Films of the left shoulder obtained in several projections.  There is obvious primary osteoarthritis with flattening of the humeral head, inferior humeral head spurring and subchondral sclerosis.  Humeral head is centered about the glenoid.  There is some flattening of the glenoid as well with joint space narrowing.  Normal space between the humeral head and the acromion.  Degenerative changes at the [facility] joint.  No ectopic calcification    PMFS History: Patient Active Problem List   Diagnosis Date Noted  . Primary osteoarthritis, left shoulder 10/12/2019  . Rash 03/31/2019  . Lower urinary tract  symptoms (LUTS) 08/27/2018  . BPPV (benign paroxysmal positional vertigo), right 12/24/2017  . ETD (eustachian tube dysfunction) 10/31/2017  . B12 deficiency   . Ataxic gait 10/12/2017  . Caregiver role strain 11/13/2015  . 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 Knee pain 05/18/2015  . Nasal congestion 05/18/2015  . Skin lesion 03/30/2015  . Advance care planning 11/05/2014  . 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 Lower abdominal pain 09/04/2014  . ED (erectile dysfunction) 11/06/2011  . Vertigo 05/17/2011  . Medicare annual wellness visit, subsequent 05/10/2011  . Bladder cancer ([place])   . Essential hypertension 09/07/2008  . BPH (benign prostatic hyperplasia) 09/07/2008  . COLONIC POLYPS, HX OF 09/07/2008  . NEPHROLITHIASIS, HX OF 09/07/2008   Past Medical History:  Diagnosis Date  . Arthritis   . B12 deficiency   . Bladder cancer ([place])   . BPH (benign prostatic hypertrophy)   . ED (erectile dysfunction)   . History of nephrolithiasis   . Hx of colonic polyps   . Hypertension     Family History  Problem Relation Age of Onset  . Hypertension Father   . AAA (abdominal aortic aneurysm) Father   . Stroke Sister   . Cancer Neg Hx   . Diabetes Neg Hx   . Colon cancer Neg Hx   . Prostate cancer Neg Hx     Past Surgical History:  Procedure Laterality Date  . bladder cancer shaving     hx bladder cancer  . CATARACT EXTRACTION    . COLONOSCOPY    . LITHOTRIPSY     per Dr. Risa Grill  . POLYPECTOMY  2009   HPP  . 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 Smoking status: Former Smoker    Quit date: 06/03/1976    Years since quitting: 43.3  . Smokeless tobacco: Never Used  Substance and Sexual Activity  . Alcohol use: No    Alcohol/week: 0.0 standard drinks  . Drug use: No  . Sexual activity: Yes

## 2019-10-14 ENCOUNTER — Telehealth: Payer: Self-pay | Admitting: Orthopaedic Surgery

## 2019-10-14 NOTE — Telephone Encounter (Signed)
Patient called. Says he is in a lot of pain. Nothing seems to help. He took pain medication, tried heat and ice, still no relief. He would like someone to call him. His number is [phone]

## 2019-10-15 ENCOUNTER — Encounter: Payer: Self-pay | Admitting: Family Medicine

## 2019-10-15 ENCOUNTER — Telehealth: Payer: Self-pay | Admitting: Orthopaedic Surgery

## 2019-10-15 ENCOUNTER — Telehealth: Payer: Self-pay

## 2019-10-15 MED ORDER — TRAMADOL HCL 50 MG PO TABS: ORAL_TABLET | ORAL | 0 refills | Status: DC

## 2019-10-15 NOTE — Telephone Encounter (Signed)
Called to pharmacy. Patient advised.  

## 2019-10-15 NOTE — Telephone Encounter (Signed)
Patient called. He would like something for pain called in to his pharmacy. His call back number is [phone]

## 2019-10-15 NOTE — Telephone Encounter (Signed)
Ortho called in tramadol.  That is what I was going to do but ortho already took care of it.  FYI to patient.  Thanks.

## 2019-10-15 NOTE — Telephone Encounter (Signed)
OK send in ultram # 20  one po bid prn pain thanks

## 2019-10-15 NOTE — Addendum Note (Signed)
Addended byLaurann Montana on: 10/15/2019 05:01 PM   Modules accepted: Orders

## 2019-10-15 NOTE — Telephone Encounter (Signed)
Error see pt message. ?

## 2019-10-15 NOTE — Telephone Encounter (Signed)
I spoke with pt; pt got a shot of cortisone by specialist and that dr is out of office today due to performing surgery today and then that dr is out of office next wk. Pt has appt with Dr Baldwin [place] on 10/19/19  But pt said pain in shoulder is pain level of 7-8. Pt wanting pain med that is not codeine but is stronger than tylenol or ibuprofen. pt cannot take med with codeine ("codeine jacks pt up") tylenol and ibuprofen not helping. Pt asked at ortho office if could get pain med until see ortho next Tues and pt was advised none of the doctors were in today.pt request Dr Damita Dunnings to send in pain med to [facility] that will last until sees specialist on 10/19/19.

## 2019-10-15 NOTE — Telephone Encounter (Signed)
See other note. Waiting to be advised by Dr Yates 

## 2019-10-15 NOTE — Telephone Encounter (Signed)
See mychart message.  Please triage patient and see what details you can get.  Thanks.

## 2019-10-15 NOTE — Telephone Encounter (Signed)
Can you please advise since Dr Durward Fortes out of office?

## 2019-10-19 ENCOUNTER — Ambulatory Visit: Payer: Medicare PPO | Admitting: Orthopedic Surgery

## 2019-10-19 ENCOUNTER — Encounter: Payer: Self-pay | Admitting: Orthopedic Surgery

## 2019-10-19 ENCOUNTER — Other Ambulatory Visit: Payer: Self-pay

## 2019-10-19 DIAGNOSIS — M19012 Primary osteoarthritis, left shoulder: Secondary | ICD-10-CM | POA: Diagnosis not present

## 2019-10-19 MED ORDER — TRAMADOL HCL 50 MG PO TABS: 50.0000 mg | ORAL_TABLET | Freq: Two times a day (BID) | ORAL | 0 refills | Status: DC | PRN

## 2019-10-19 NOTE — Progress Notes (Signed)
Office Visit Note   Patient: Richard King           Date of Birth: August 18, 1940           MRN: [id number] Visit Date: 10/19/2019              Requested by: Tonia Ghent, MD [address] [place],  [place] [address] PCP: Tonia Ghent, MD   Assessment & Plan: Visit Diagnoses:  1. Primary osteoarthritis, left shoulder     Plan:  #1: At this time I have renewed his tramadol for pain. #2: Going to have a consultation with Dr. Marlou Sa for possible shoulder replacement in the future #3: Use of Voltaren gel as an anti-inflammatory and hopefully will help his pain also in the shoulder. #4: He did have an order for PT but I am not sure if this will be any benefit for him since he is resistant to moving the shoulder because of the pain.  Follow-Up Instructions: No follow-ups on file.  Pending referral to Dr. Alphonzo Severance  Orders:  Orders Placed This Encounter  Procedures  . Ambulatory referral to Orthopedic Surgery   Meds ordered this encounter  Medications  . traMADol (ULTRAM) 50 MG tablet    Sig: Take 1 tablet (50 mg total) by mouth every 12 (twelve) hours as needed for moderate pain or severe pain.    Dispense:  20 tablet    Refill:  0    Order Specific Question:   Supervising Provider    Answer:   Garald Balding [id number]      Procedures: No procedures performed   Clinical Data: No additional findings.   Subjective: Chief Complaint  Patient presents with  . Left Shoulder - Follow-up   HPI Patient presents today for follow up on his left shoulder pain. He was here one week ago and received a cortisone injection in his left shoulder. He said that the pain was worse two days after getting the cortisone injection. He was given Tramadol last Friday by Dr.Whitfield but states that it made him feel lethargic and he still hurt. He said that he still cannot raise his arm, but states that has worsened since his last visit as well.  According to his note and the  patient he did have improvement in his pain after the injection but this was very temporary.   Review of Systems  Constitutional: Negative for fatigue.  HENT: Negative for ear pain.   Eyes: Negative for pain.  Respiratory: Negative for shortness of breath.   Cardiovascular: Positive for leg swelling.  Gastrointestinal: Negative for constipation and diarrhea.  Genitourinary: Positive for difficulty urinating.  Musculoskeletal: Negative for joint swelling.  Skin: Negative for rash.  Allergic/Immunologic: Negative for food allergies.  Neurological: Negative for weakness.  Hematological: Does not bruise/bleed easily.  Psychiatric/Behavioral: Positive for sleep disturbance.     Objective: Vital Signs: There were no vitals taken for this visit.  Physical Exam Constitutional:      Appearance: Normal appearance. He is well-developed. He is obese.  HENT:     Head: Normocephalic.  Eyes:     Pupils: Pupils are equal, round, and reactive to light.  Pulmonary:     Effort: Pulmonary effort is normal.  Skin:    General: Skin is warm and dry.  Neurological:     Mental Status: He is alert and oriented to person, place, and time.  Psychiatric:        Behavior: Behavior normal.  Ortho Exam  Exam today reveals tenderness to palpation about the shoulder girdle.  He has limited in his range of motion to only about 45 degrees of forward flexion and abduction passively.  Actively he he resists moving secondary to pain.  He has decreased strength in all planes tested.  Difficult exam secondary to pain.  Specialty Comments:  No specialty comments available.  Imaging: No results found.   PMFS History: Patient Active Problem List   Diagnosis Date Noted  . Primary osteoarthritis, left shoulder 10/12/2019  . Rash 03/31/2019  . Lower urinary tract symptoms (LUTS) 08/27/2018  . BPPV (benign paroxysmal positional vertigo), right 12/24/2017  . ETD (eustachian tube dysfunction) 10/31/2017    . B12 deficiency   . Ataxic gait 10/12/2017  . Caregiver role strain 11/13/2015  . 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 Knee pain 05/18/2015  . Nasal congestion 05/18/2015  . Skin lesion 03/30/2015  . Advance care planning 11/05/2014  . 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 Lower abdominal pain 09/04/2014  . ED (erectile dysfunction) 11/06/2011  . Vertigo 05/17/2011  . Medicare annual wellness visit, subsequent 05/10/2011  . Bladder cancer ([place])   . Essential hypertension 09/07/2008  . BPH (benign prostatic hyperplasia) 09/07/2008  . COLONIC POLYPS, HX OF 09/07/2008  . NEPHROLITHIASIS, HX OF 09/07/2008   Past Medical History:  Diagnosis Date  . Arthritis   . B12 deficiency   . Bladder cancer ([place])   . BPH (benign prostatic hypertrophy)   . ED (erectile dysfunction)   . History of nephrolithiasis   . Hx of colonic polyps   . Hypertension     Family History  Problem Relation Age of Onset  . Hypertension Father   . AAA (abdominal aortic aneurysm) Father   . Stroke Sister   . Cancer Neg Hx   . Diabetes Neg Hx   . Colon cancer Neg Hx   . Prostate cancer Neg Hx     Past Surgical History:  Procedure Laterality Date  . bladder cancer shaving     hx bladder cancer  . CATARACT EXTRACTION    . COLONOSCOPY    . LITHOTRIPSY     per Dr. Risa Grill  . POLYPECTOMY  2009   HPP  . 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 Smoking status: Former Smoker    Quit date: 06/03/1976    Years since quitting: 43.4  . Smokeless tobacco: Never Used  Substance and Sexual Activity  . Alcohol use: No    Alcohol/week: 0.0 standard drinks  . Drug use: No  . Sexual activity: Yes

## 2019-10-20 ENCOUNTER — Ambulatory Visit: Payer: Medicare PPO | Admitting: Orthopedic Surgery

## 2019-10-20 ENCOUNTER — Encounter: Payer: Self-pay | Admitting: Orthopedic Surgery

## 2019-10-20 VITALS — Ht 71.0 in | Wt 235.0 lb

## 2019-10-20 DIAGNOSIS — M19012 Primary osteoarthritis, left shoulder: Secondary | ICD-10-CM | POA: Diagnosis not present

## 2019-10-20 MED ORDER — DIAZEPAM 10 MG PO TABS: 10.0000 mg | ORAL_TABLET | Freq: Once | ORAL | 0 refills | Status: AC

## 2019-10-21 ENCOUNTER — Encounter: Payer: Medicare PPO | Admitting: Family Medicine

## 2019-10-22 ENCOUNTER — Encounter: Payer: Self-pay | Admitting: Orthopedic Surgery

## 2019-10-22 ENCOUNTER — Other Ambulatory Visit: Payer: Self-pay

## 2019-10-22 ENCOUNTER — Ambulatory Visit: Payer: Medicare PPO | Admitting: Rehabilitative and Restorative Service Providers"

## 2019-10-22 DIAGNOSIS — M19012 Primary osteoarthritis, left shoulder: Secondary | ICD-10-CM

## 2019-10-22 NOTE — Progress Notes (Signed)
Office Visit Note   Patient: Richard King           Date of Birth: 1941-05-10           MRN: [id number] Visit Date: 10/20/2019 Requested by: Cherylann Ratel, PA-C [address] [place],  [place] [address] PCP: Tonia Ghent, MD  Subjective: Chief Complaint  Patient presents with  . Left Shoulder - Pain    HPI: Richard King is a 79 y.o. male who presents to the office complaining of left shoulder pain.  Patient is referred by Dr. Durward Fortes and Biagio Borg..  He has history of years of left shoulder pain.  He notes significantly worse left shoulder pain in the last several weeks.  He has difficulty sleeping due to the left shoulder pain.  Pain is worse at night and he sleeps in a recliner.  Pain does wake him up at night.  He had a left shoulder injection on 10/12/2019 by Dr. Durward Fortes.  He uses melatonin and tramadol for his pain with little relief.  He denies any significant neck pain but does note occasional numbness and tingling when he raises his arm.  He is retired but he asked his caretaker for his wife who has dementia..                ROS:  All systems reviewed are negative as they relate to the chief complaint within the history of present illness.  Patient denies fevers or chills.  Assessment & Plan: Visit Diagnoses:  1. Primary osteoarthritis, left shoulder     Plan: Patient is a 79 year old male who presents complaining of left shoulder pain.  He has a history of chronic left shoulder pain for several years with severe end-stage left glenohumeral osteoarthritis.  He has difficulty lifting his arm and weakness with infraspinatus/supraspinatus on exam.  Passively he has about 45 degrees of abduction but relatively good forward flexion at greater than 90 degrees and surprisingly good external rotation.  Plan for reverse shoulder arthroplasty.  Risks and benefits of the procedure were discussed at length.  We will order thin cut CT scan of the left shoulder for preop  planning purposes.  We will keep in mind that patient's left shoulder was injected on 10/12/2019 when scheduling surgery.  Follow-Up Instructions: No follow-ups on file.   Orders:  No orders of the defined types were placed in this encounter.  Meds ordered this encounter  Medications  . diazepam (VALIUM) 10 MG tablet    Sig: Take 1 tablet (10 mg total) by mouth once for 1 dose. Take 30-60 mins prior to MRI scan.  Do not drive a vehicle or operate heavy machinery after taking this medication.    Dispense:  2 tablet    Refill:  0      Procedures: No procedures performed   Clinical Data: No additional findings.  Objective: Vital Signs: Ht 5\' 11"  (1.803 m)   Wt 235 lb (106.6 kg)   BMI 32.78 kg/m   Physical Exam:  Constitutional: Patient appears well-developed HEENT:  Head: Normocephalic Eyes:EOM are normal Neck: Normal range of motion Cardiovascular: Normal rate Pulmonary/chest: Effort normal Neurologic: Patient is alert Skin: Skin is warm Psychiatric: Patient has normal mood and affect  Ortho Exam:  Left shoulder Exam 45 degrees of passive abduction.  Greater than 90 degrees of passive forward flexion. Mild loss of ER relative to the other shoulder.  Good endpoint with ER Mild to moderate tenderness over the [facility] joint  and bicipital groove Good subscapularis strength.  Marked weakness of the infraspinatus/supraspinatus Negative Hawkins impingement 5/5 grip strength, forearm pronation/supination, and bicep strength   Specialty Comments:  No specialty comments available.  Imaging: No results found.   PMFS History: Patient Active Problem List   Diagnosis Date Noted  . Primary osteoarthritis, left shoulder 10/12/2019  . Rash 03/31/2019  . Lower urinary tract symptoms (LUTS) 08/27/2018  . BPPV (benign paroxysmal positional vertigo), right 12/24/2017  . ETD (eustachian tube dysfunction) 10/31/2017  . B12 deficiency   . Ataxic gait 10/12/2017  . Caregiver role  strain 11/13/2015  . 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 Knee pain 05/18/2015  . Nasal congestion 05/18/2015  . Skin lesion 03/30/2015  . Advance care planning 11/05/2014  . 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 Lower abdominal pain 09/04/2014  . ED (erectile dysfunction) 11/06/2011  . Vertigo 05/17/2011  . Medicare annual wellness visit, subsequent 05/10/2011  . Bladder cancer ([place])   . Essential hypertension 09/07/2008  . BPH (benign prostatic hyperplasia) 09/07/2008  . COLONIC POLYPS, HX OF 09/07/2008  . NEPHROLITHIASIS, HX OF 09/07/2008   Past Medical History:  Diagnosis Date  . Arthritis   . B12 deficiency   . Bladder cancer ([place])   . BPH (benign prostatic hypertrophy)   . ED (erectile dysfunction)   . History of nephrolithiasis   . Hx of colonic polyps   . Hypertension     Family History  Problem Relation Age of Onset  . Hypertension Father   . AAA (abdominal aortic aneurysm) Father   . Stroke Sister   . Cancer Neg Hx   . Diabetes Neg Hx   . Colon cancer Neg Hx   . Prostate cancer Neg Hx     Past Surgical History:  Procedure Laterality Date  . bladder cancer shaving     hx bladder cancer  . CATARACT EXTRACTION    . COLONOSCOPY    . LITHOTRIPSY     per Dr. Risa Grill  . POLYPECTOMY  2009   HPP  . 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 Smoking status: Former Smoker    Quit date: 06/03/1976    Years since quitting: 43.4  . Smokeless tobacco: Never Used  Substance and Sexual Activity  . Alcohol use: No    Alcohol/week: 0.0 standard drinks  . Drug use: No  . Sexual activity: Yes

## 2019-10-25 ENCOUNTER — Ambulatory Visit (INDEPENDENT_AMBULATORY_CARE_PROVIDER_SITE_OTHER): Payer: Medicare PPO | Admitting: Family Medicine

## 2019-10-25 ENCOUNTER — Other Ambulatory Visit: Payer: Self-pay

## 2019-10-25 ENCOUNTER — Encounter: Payer: Self-pay | Admitting: Family Medicine

## 2019-10-25 VITALS — BP 160/94 | HR 74 | Temp 97.6°F | Ht 70.5 in | Wt 233.0 lb

## 2019-10-25 DIAGNOSIS — Z0000 Encounter for general adult medical examination without abnormal findings: Secondary | ICD-10-CM | POA: Diagnosis not present

## 2019-10-25 DIAGNOSIS — Z638 Other specified problems related to primary support group: Secondary | ICD-10-CM

## 2019-10-25 DIAGNOSIS — N400 Enlarged prostate without lower urinary tract symptoms: Secondary | ICD-10-CM

## 2019-10-25 DIAGNOSIS — I10 Essential (primary) hypertension: Secondary | ICD-10-CM | POA: Diagnosis not present

## 2019-10-25 DIAGNOSIS — M19012 Primary osteoarthritis, left shoulder: Secondary | ICD-10-CM | POA: Diagnosis not present

## 2019-10-25 DIAGNOSIS — Z7189 Other specified counseling: Secondary | ICD-10-CM

## 2019-10-25 DIAGNOSIS — E538 Deficiency of other specified B group vitamins: Secondary | ICD-10-CM

## 2019-10-25 MED ORDER — TRAMADOL HCL 50 MG PO TABS: 50.0000 mg | ORAL_TABLET | Freq: Four times a day (QID) | ORAL | 1 refills | Status: DC | PRN

## 2019-10-25 MED ORDER — CYANOCOBALAMIN 1000 MCG/ML IJ SOLN
1000.0000 ug | Freq: Once | INTRAMUSCULAR | Status: AC
  Administered 2019-10-25: 1000 ug via INTRAMUSCULAR

## 2019-10-25 NOTE — Patient Instructions (Signed)
Keep trying to do arm swing when out of the sling every day.   Use the tramadol as needed.  Thanks for your effort.  Take care.  Glad to see you.  B12 shot today after labs.  We'll update you about the labs.

## 2019-10-25 NOTE — Progress Notes (Signed)
This visit occurred during the SARS-CoV-2 public health emergency.  Safety protocols were in place, including screening questions prior to the visit, additional usage of staff PPE, and extensive cleaning of exam room while observing appropriate contact time as indicated for disinfecting solutions.  I have personally reviewed the Medicare Annual Wellness questionnaire and have noted 1. The patient's medical and social history 2. Their use of alcohol, tobacco or illicit drugs 3. Their current medications and supplements 4. The patient's functional ability including ADL's, fall risks, home safety risks and hearing or visual             impairment. 5. Diet and physical activities 6. Evidence for depression or mood disorders  The patients weight, height, BMI have been recorded in the chart and visual acuity is per eye clinic.  I have made referrals, counseling and provided education to the patient based review of the above and I have provided the pt with a written personalized care plan for preventive services.  Provider list updated- see scanned forms.  Routine anticipatory guidance given to patient.  See health maintenance. The possibility exists that previously documented standard health maintenance information may have been brought forward from a previous encounter into this note.  If needed, that same information has been updated to reflect the current situation based on today's encounter.    Flu yearly  Shingles d/w pt.   PNA UTD Tetanus 2020 Covid vaccine d/w pt.  Encouraged.   Colon declined until his shoulder is improved.  D/w pt.   Advance directive d/w pt. Daughter Rodena Piety or [name] to make health care decisions if needed.  Cognitive function addressed- see scanned forms- and if abnormal then additional documentation follows.   Hypertension:    Using medication without problems or lightheadedness: yes Chest pain with exertion:no Edema:trace RLE edema at baseline.   Short of breath:  only with walking hills, at baseline for years.   Labs pending.    LUTS better with 2mg  doxazosin, nocturia resolved.    His wife has memory loss.  She "wants to go home" at the end of the day.  Still on sertaline.  No ADE on med.  He felt good as is.    B12 def.  Due for f/u labs.  He had been off IM replacement due to covid considerations.    Tramadol only last 6 hours for L shoulder pain.  No ADE on med.  He was asking if his wife could get placed temporarily for rehab, if he has surgery.   I told him I will check on that and I have messaged staff to try to get extra information.  PMH and SH reviewed  Meds, vitals, and allergies reviewed.   ROS: Per HPI.  Unless specifically indicated otherwise in HPI, the patient denies:  General: fever. Eyes: acute vision changes ENT: sore throat Cardiovascular: chest pain Respiratory: SOB GI: vomiting GU: dysuria Musculoskeletal: acute back pain Derm: acute rash Neuro: acute motor dysfunction Psych: worsening mood Endocrine: polydipsia Heme: bleeding Allergy: hayfever  GEN: nad, alert and oriented HEENT: ncat NECK: supple w/o LA CV: rrr. PULM: ctab, no inc wob ABD: soft, +bs EXT: no edema SKIN: no acute rash L arm in sling.

## 2019-10-26 LAB — VITAMIN B12: Vitamin B-12: 245 pg/mL (ref 211–911)

## 2019-10-26 LAB — CBC WITH DIFFERENTIAL/PLATELET
Basophils Absolute: 0.1 10*3/uL (ref 0.0–0.1)
Basophils Relative: 1 % (ref 0.0–3.0)
Eosinophils Absolute: 0.1 10*3/uL (ref 0.0–0.7)
Eosinophils Relative: 2.3 % (ref 0.0–5.0)
HCT: 42.2 % (ref 39.0–52.0)
Hemoglobin: 14.5 g/dL (ref 13.0–17.0)
Lymphocytes Relative: 21.7 % (ref 12.0–46.0)
Lymphs Abs: 1.4 10*3/uL (ref 0.7–4.0)
MCHC: 34.3 g/dL (ref 30.0–36.0)
MCV: 95.8 fl (ref 78.0–100.0)
Monocytes Absolute: 0.7 10*3/uL (ref 0.1–1.0)
Monocytes Relative: 10.1 % (ref 3.0–12.0)
Neutro Abs: 4.3 10*3/uL (ref 1.4–7.7)
Neutrophils Relative %: 64.9 % (ref 43.0–77.0)
Platelets: 217 10*3/uL (ref 150.0–400.0)
RBC: 4.41 Mil/uL (ref 4.22–5.81)
RDW: 13.8 % (ref 11.5–15.5)
WBC: 6.6 10*3/uL (ref 4.0–10.5)

## 2019-10-26 LAB — COMPREHENSIVE METABOLIC PANEL
ALT: 14 U/L (ref 0–53)
AST: 16 U/L (ref 0–37)
Albumin: 4.3 g/dL (ref 3.5–5.2)
Alkaline Phosphatase: 77 U/L (ref 39–117)
BUN: 17 mg/dL (ref 6–23)
CO2: 29 mEq/L (ref 19–32)
Calcium: 9.7 mg/dL (ref 8.4–10.5)
Chloride: 106 mEq/L (ref 96–112)
Creatinine, Ser: 0.92 mg/dL (ref 0.40–1.50)
GFR: 79.29 mL/min (ref >60.00)
Glucose, Bld: 99 mg/dL (ref 70–99)
Potassium: 4.5 mEq/L (ref 3.5–5.1)
Sodium: 141 mEq/L (ref 135–145)
Total Bilirubin: 0.7 mg/dL (ref 0.2–1.2)
Total Protein: 6.7 g/dL (ref 6.0–8.3)

## 2019-10-26 LAB — LIPID PANEL
Cholesterol: 153 mg/dL (ref 0–200)
HDL: 53.3 mg/dL (ref >39.00)
LDL Cholesterol: 75 mg/dL (ref 0–99)
NonHDL: 99.8
Total CHOL/HDL Ratio: 3
Triglycerides: 124 mg/dL (ref 0.0–149.0)
VLDL: 24.8 mg/dL (ref 0.0–40.0)

## 2019-10-27 NOTE — Assessment & Plan Note (Signed)
LUTS better with 2mg  doxazosin, nocturia resolved.   Continue as is.  He agrees.

## 2019-10-27 NOTE — Assessment & Plan Note (Signed)
Repeat injection done today.  See notes on labs.

## 2019-10-27 NOTE — Assessment & Plan Note (Signed)
Advance directive d/w pt. Daughter Rodena Piety or [name] to make health care decisions if needed.

## 2019-10-27 NOTE — Assessment & Plan Note (Signed)
Flu yearly  Shingles d/w pt.   PNA UTD Tetanus 2020 Covid vaccine d/w pt.  Encouraged.   Colon declined until his shoulder is improved.  D/w pt.   Advance directive d/w pt. Daughter Rodena Piety or [name] to make health care decisions if needed.  Cognitive function addressed- see scanned forms- and if abnormal then additional documentation follows.

## 2019-10-27 NOTE — Assessment & Plan Note (Signed)
His wife has memory loss.  She "wants to go home" at the end of the day.  Still on sertaline.  No ADE on med.  He felt good as is.  Would continue sertraline as is for now.  He was asking if his wife could get placed temporarily for rehab, if he has surgery.   I told him I will check on that and I have messaged staff to try to get extra information.

## 2019-10-27 NOTE — Assessment & Plan Note (Signed)
He was in a sling today, he started using that on his own at home.  Discussed with patient about using pendulum swings to prevent a frozen shoulder.Tramadol only last 6 hours for L shoulder pain.  No ADE on med.  He was asking if his wife could get placed temporarily for rehab, if he has surgery.   I told him I will check on that and I have messaged staff to try to get extra information.  Would continue tramadol as is for now.

## 2019-10-27 NOTE — Assessment & Plan Note (Signed)
Continue doxazosin and verapamil.  Update me as needed.  He agrees.

## 2019-11-05 ENCOUNTER — Ambulatory Visit
Admission: RE | Admit: 2019-11-05 | Discharge: 2019-11-05 | Disposition: A | Discharge status: Home / Self Care | Payer: Medicare PPO | Source: Ambulatory Visit | Attending: Orthopedic Surgery | Admitting: Orthopedic Surgery

## 2019-11-05 ENCOUNTER — Other Ambulatory Visit: Payer: Self-pay

## 2019-11-05 DIAGNOSIS — M19012 Primary osteoarthritis, left shoulder: Secondary | ICD-10-CM | POA: Diagnosis not present

## 2019-11-05 DIAGNOSIS — Z01818 Encounter for other preprocedural examination: Secondary | ICD-10-CM | POA: Diagnosis not present

## 2019-11-08 ENCOUNTER — Ambulatory Visit: Payer: Medicare PPO | Admitting: Rehabilitative and Restorative Service Providers"

## 2019-11-08 ENCOUNTER — Encounter: Payer: Self-pay | Admitting: Rehabilitative and Restorative Service Providers"

## 2019-11-08 ENCOUNTER — Other Ambulatory Visit: Payer: Self-pay

## 2019-11-08 DIAGNOSIS — G8929 Other chronic pain: Secondary | ICD-10-CM | POA: Diagnosis not present

## 2019-11-08 DIAGNOSIS — R293 Abnormal posture: Secondary | ICD-10-CM | POA: Diagnosis not present

## 2019-11-08 DIAGNOSIS — M25512 Pain in left shoulder: Secondary | ICD-10-CM

## 2019-11-08 DIAGNOSIS — M6281 Muscle weakness (generalized): Secondary | ICD-10-CM

## 2019-11-08 NOTE — Patient Instructions (Signed)
Access Code: [email or web address] URL: [email or web address] Date: 11/08/2019 Prepared by: Scot Jun  Exercises Seated Scapular Retraction - 2 x daily - 7 x weekly - 10 reps - 2 sets - 5 hold Circular Shoulder Pendulum with Table Support - 2 x daily - 7 x weekly - 3 sets - 10 reps Standing Shoulder Row with Anchored Resistance - 2 x daily - 7 x weekly - 10 reps - 3 sets Shoulder Extension with Resistance - 2 x daily - 7 x weekly - 10 reps - 3 sets

## 2019-11-08 NOTE — Therapy (Signed)
[place], [place], [address] Phone: [phone]   Fax:  [phone]  Physical Therapy Evaluation  Patient Details  Name: Richard King MRN: [id number] Date of Birth: 1941-04-24 Referring Provider (PT): Dr. Marlou Sa   Encounter Date: 11/08/2019  PT End of Session - 11/08/19 1256    Visit Number  1    Number of Visits  12    Date for PT Re-Evaluation  12/06/19    Authorization Time Period  Requested humana cert 12 visits (0/08/2120 - 01/03/2020    Authorization - Visit Number  1    Progress Note Due on Visit  10    PT Start Time  1300    PT Stop Time  1335    PT Time Calculation (min)  35 min    Activity Tolerance  Patient tolerated treatment well    Behavior During Therapy  [facility] for tasks assessed/performed       Past Medical History:  Diagnosis Date  . Arthritis   . B12 deficiency   . Bladder cancer ([place])   . BPH (benign prostatic hypertrophy)   . ED (erectile dysfunction)   . History of nephrolithiasis   . Hx of colonic polyps   . Hypertension     Past Surgical History:  Procedure Laterality Date  . bladder cancer shaving     hx bladder cancer  . CATARACT EXTRACTION    . COLONOSCOPY    . LITHOTRIPSY     per Dr. Risa Grill  . POLYPECTOMY  2009   HPP  . 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   Subjective Assessment - 11/08/19 1305    Subjective  Pt. indicated Lt shoulder pain off and on "for years".  Pt. indicated about 4-6 weeks ago his arm started to hurt more and more.  Pt. indicated injection in Lt shoulder with no improvement after injection.   Pt. stated seeing another MD in clinic here and had CT scan performed, uncertain of results.  Pt. stated he discussed c MD about possible surgery.  Pt. indicated seeing MD next week for follow up about scan and possible surgery.    Limitations  Lifting    Currently in  Pain?  Yes    Pain Score  1    at worst 8 /10   Pain Location  Shoulder    Pain Orientation  Right    Pain Descriptors / Indicators  Aching    Pain Type  Chronic pain    Pain Onset  More than a month ago    Pain Frequency  Constant    Aggravating Factors   waking up in morning, reaching overhead, lifting, reaching out    Pain Relieving Factors  Medicine, cream    Effect of Pain on Daily Activities  Reaching c arm to grab things, sleeping.         [facility] PT Assessment - 11/08/19 0001      Assessment   Medical Diagnosis  Lt shoulder pain, OA    Referring Provider (PT)  Dr. 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    AROM Assessment Site  Shoulder    Right/Left Shoulder  Right;Left    Left Shoulder Flexion  20 Degrees    Left Shoulder ABduction  15 Degrees      PROM   PROM Assessment Site  Shoulder    Right/Left Shoulder  Left;Right    Left Shoulder Flexion  110 Degrees    Left Shoulder ABduction  90 Degrees    Left Shoulder Internal Rotation  30 Degrees    Left Shoulder External Rotation  5 Degrees      Strength   Overall Strength Comments  Unable to perform full ROM actively for any  Lt gh jt movement.    Strength Assessment Site  Shoulder;Elbow;Forearm    Right/Left Shoulder  Left;Right    Right Shoulder Flexion  4+/5    Right Shoulder ABduction  4/5    Right Shoulder Internal Rotation  5/5    Right Shoulder External Rotation  4+/5    Left Shoulder Flexion  2-/5    Left Shoulder ABduction  2-/5    Left Shoulder Internal Rotation  2+/5    Left Shoulder External Rotation  2-/5    Right/Left Elbow  Left;Right    Right/Left Forearm  Left;Right      Palpation   Palpation comment  Crepitus noted throughout passive LT [place] jt mobility                  Objective measurements completed on examination: See above findings.      [facility] PT Treatment/Exercise - 11/08/19 0001      Exercises   Exercises  Shoulder      Shoulder Exercises: Seated   Other Seated Exercises  seated tband rows, gh ext green 20 x, scap retraction x 10      Shoulder Exercises: Standing   Other Standing Exercises  pendulum instruction             PT Education - 11/08/19 1255    Education Details  HEP, POC    Person(s) Educated  Patient    Methods  Explanation;Demonstration;Handout;Verbal cues    Comprehension  Verbalized understanding;Returned demonstration          PT Long Term Goals - 11/08/19 1258      PT LONG TERM GOAL #1   Title  Patient will demonstrate/report pain at worst less than or equal to 2/10 to facilitate minimal limitation in daily activity secondary to pain symptoms.    Time  8    Period  Weeks    Status  New    Target Date  01/03/20      PT LONG TERM GOAL #2   Title  Patient will demonstrate independent use of home exercise program  to facilitate ability to maintain/progress functional gains from skilled physical therapy services.    Time  8    Period  Weeks    Status  New    Target Date  01/03/20      PT LONG TERM GOAL #3   Title  Patient will demonstrate Lt [place] joint mobility WFL to facilitate usual self care, dressing, reaching  overhead at PLOF s limitation due to symptoms.    Time  8    Period  Weeks    Status  New    Target Date  01/03/20      PT LONG TERM GOAL #4   Title  Patient will demonstrate Lt UE MMT 5/5 throughout to facilitate usual lifting, carrying in functional activity to PLOF s limitation.    Time  8    Period  Weeks    Status  New    Target Date  01/03/20      PT LONG TERM GOAL #5   Title  Pt. will demonstrate self selected posture correction for improved biomechanical GH/scapulothoracic movement pattern to facilitate mobility unrestricted 2/2 condition.    Time  8    Period  Weeks    Status  New    Target Date  01/03/20             Plan - 11/08/19 1257    Clinical Impression Statement  Patient is a 79 y.o. male who comes to clinic with complaints of Lt shoulder pain with mobility, strength and movement coordination deficits that impair his ability to perform usual daily and recreational functional activities without increase difficulty/symptoms at this time.  Patient to benefit from skilled PT services to address impairments and limitations to improve to previous level of function without restriction secondary to condition.  Pt. to see MD next week for possible surgical intervention - if surgery performed, recommend use of HEP and return to skilled PT services post surgical.    Personal Factors and Comorbidities  Comorbidity 1    Comorbidities  HTN    Examination-Activity Limitations  Bathing;Caring for Others;Carry;Dressing;Lift;Reach Overhead;Sleep    Examination-Participation Restrictions  Other;Laundry   household activity   Stability/Clinical Decision Making  Stable/Uncomplicated    Clinical Decision Making  Low    PT Frequency  2x / week    PT Duration  8 weeks    PT Treatment/Interventions  ADLs/Self Care Home Management;Cryotherapy;Electrical Stimulation;Iontophoresis 4mg /ml Dexamethasone;Moist Heat;Ultrasound;Functional mobility training;Therapeutic activities;Therapeutic  exercise;Traction;Balance training;Neuromuscular re-education;Passive range of motion;Dry needling;Patient/family education;Taping;Vasopneumatic Device;Joint Manipulations;Spinal Manipulations    PT Next Visit Plan  HEP review    PT Home Exercise Plan  [id number]    Recommended Other Services  Return to MD for surgical possiblity follow up    Consulted and Agree with Plan of Care  Patient       Patient will benefit from skilled therapeutic intervention in order to improve the following deficits and impairments:  Decreased endurance, Hypomobility, Pain, Impaired UE functional use, Decreased strength, Decreased activity tolerance, Decreased mobility, Decreased range of motion, Impaired flexibility, Postural dysfunction  Visit Diagnosis: Chronic left shoulder pain - Plan: PT plan of care cert/re-cert  Muscle weakness (generalized) - Plan: PT plan of care cert/re-cert  Abnormal posture - Plan: PT plan of care cert/re-cert     Problem List Patient Active Problem List   Diagnosis Date Noted  . Primary osteoarthritis, left shoulder 10/12/2019  . Rash 03/31/2019  . Lower urinary tract symptoms (LUTS) 08/27/2018  . BPPV (benign paroxysmal positional vertigo), right  12/24/2017  . B12 deficiency   . Ataxic gait 10/12/2017  . Caregiver role strain 11/13/2015  . 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 Knee pain 05/18/2015  . Nasal congestion 05/18/2015  . Skin lesion 03/30/2015  . Advance care planning 11/05/2014  . 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 Lower abdominal pain 09/04/2014  . ED (erectile dysfunction) 11/06/2011  . Vertigo 05/17/2011  . Medicare annual wellness visit, subsequent 05/10/2011  . Bladder cancer ([place])   . Essential hypertension 09/07/2008  . BPH (benign prostatic hyperplasia) 09/07/2008  . COLONIC POLYPS, HX OF 09/07/2008  . NEPHROLITHIASIS, HX OF 09/07/2008    Scot Jun, PT, DPT, OCS, ATC 11/08/19  2:02 PM    [facility] [address] [place], [place], [address] Phone: [phone]   Fax:  [phone]  Name: Richard King MRN: [id number] Date of Birth: [birth year]

## 2019-11-09 ENCOUNTER — Ambulatory Visit (INDEPENDENT_AMBULATORY_CARE_PROVIDER_SITE_OTHER): Payer: Medicare PPO

## 2019-11-09 DIAGNOSIS — E538 Deficiency of other specified B group vitamins: Secondary | ICD-10-CM

## 2019-11-09 MED ORDER — CYANOCOBALAMIN 1000 MCG/ML IJ SOLN
1000.0000 ug | Freq: Once | INTRAMUSCULAR | Status: AC
Start: 2019-11-09 — End: 2019-11-09
  Administered 2019-11-09: 1000 ug via INTRAMUSCULAR

## 2019-11-09 NOTE — Progress Notes (Signed)
Pt received bi-monthly B12 injection 1051mcg/ml in left deltoid. Tolerated well.

## 2019-11-15 NOTE — Patient Outreach (Signed)
This referral was for Mr. Cobles wife I have sent the referral back to Jill Alexanders asking to send it back for Mrs. Seldon.

## 2019-11-16 ENCOUNTER — Ambulatory Visit: Payer: Medicare PPO | Admitting: Orthopaedic Surgery

## 2019-11-17 ENCOUNTER — Ambulatory Visit: Payer: Medicare PPO | Admitting: Orthopedic Surgery

## 2019-11-17 DIAGNOSIS — M19012 Primary osteoarthritis, left shoulder: Secondary | ICD-10-CM | POA: Diagnosis not present

## 2019-11-19 ENCOUNTER — Other Ambulatory Visit: Payer: Self-pay

## 2019-11-19 NOTE — Progress Notes (Signed)
Office Visit Note   Patient: Richard King           Date of Birth: 1940/12/15           MRN: [id number] Visit Date: 11/17/2019 Requested by: Tonia Ghent, MD [place],  [place] [address] PCP: Tonia Ghent, MD  Subjective: Chief Complaint  Patient presents with  . Follow-up    HPI: Richard King is a 79 y.o. male who presents to the office complaining of left shoulder pain.  He returns to discuss CT scan results.  CT scan was obtained for preoperative planning purposes.  Patient notes he wants to postpone surgery by 1 week due to a beach trip.  He has been using Voltaren gel with little relief.  He is currently scheduled for surgery on 12/21/2019..                ROS:  All systems reviewed are negative as they relate to the chief complaint within the history of present illness.  Patient denies fevers or chills.  Assessment & Plan: Visit Diagnoses: No diagnosis found.  Plan: Patient is a 79 year old male who presents to review CT scan of left shoulder.  CT scan was obtained for preoperative planning purposes.  He is scheduled for reverse shoulder arthroplasty on 12/21/2019.  Plan to postpone this by about a week to a Thursday afternoon due to patient's family beach trip.  Axillary nerve is functioning well on exam today.  No evidence of severe weakness from cervical disc pathology with good bicep/tricep strength.  Follow-up after surgical procedure.  Follow-Up Instructions: No follow-ups on file.   Orders:  No orders of the defined types were placed in this encounter.  No orders of the defined types were placed in this encounter.     Procedures: No procedures performed   Clinical Data: No additional findings.  Objective: Vital Signs: There were no vitals taken for this visit.  Physical Exam:  Constitutional: Patient appears well-developed HEENT:  Head: Normocephalic Eyes:EOM are normal Neck: Normal range of motion Cardiovascular: Normal rate  Pulmonary/chest: Effort normal Neurologic: Patient is alert Skin: Skin is warm Psychiatric: Patient has normal mood and affect  Ortho Exam:  Left shoulder Exam 50 degrees abduction passively Axillary nerve functioning well 5\5 motor strength of the bicep and tricep Weakness with infraspinatus and supraspinatus resistance testing.  Subscapularis with good strength Negative Hawkins impingement 5/5 grip strength, forearm pronation/supination, and bicep strength  Specialty Comments:  No specialty comments available.  Imaging: No results found.   PMFS History: Patient Active Problem List   Diagnosis Date Noted  . Primary osteoarthritis, left shoulder 10/12/2019  . Rash 03/31/2019  . Lower urinary tract symptoms (LUTS) 08/27/2018  . BPPV (benign paroxysmal positional vertigo), right 12/24/2017  . B12 deficiency   . Ataxic gait 10/12/2017  . Caregiver role strain 11/13/2015  . 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 Knee pain 05/18/2015  . Nasal congestion 05/18/2015  . Skin lesion 03/30/2015  . Advance care planning 11/05/2014  . 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 Lower abdominal pain 09/04/2014  . ED (erectile dysfunction) 11/06/2011  . Vertigo 05/17/2011  . Medicare annual wellness visit, subsequent 05/10/2011  . Bladder cancer ([place])   . Essential hypertension 09/07/2008  . BPH (benign prostatic hyperplasia) 09/07/2008  . COLONIC POLYPS, HX OF 09/07/2008  . NEPHROLITHIASIS, HX OF 09/07/2008   Past Medical History:  Diagnosis Date  . Arthritis   . B12 deficiency   .  Bladder cancer ([place])   . BPH (benign prostatic hypertrophy)   . ED (erectile dysfunction)   . History of nephrolithiasis   . Hx of colonic polyps   . Hypertension     Family History  Problem Relation Age of Onset  . Hypertension Father   . AAA (abdominal aortic aneurysm) Father   . Stroke Sister   . Cancer Neg Hx   . Diabetes Neg Hx   . Colon cancer Neg Hx   . Prostate cancer Neg Hx     Past  Surgical History:  Procedure Laterality Date  . bladder cancer shaving     hx bladder cancer  . CATARACT EXTRACTION    . COLONOSCOPY    . LITHOTRIPSY     per Dr. Risa Grill  . POLYPECTOMY  2009   HPP  . 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 Smoking status: Former Smoker    Quit date: 06/03/1976    Years since quitting: 43.4  . Smokeless tobacco: Never Used  Vaping Use  . Vaping Use: Never used  Substance and Sexual Activity  . Alcohol use: No    Alcohol/week: 0.0 standard drinks  . Drug use: No  . Sexual activity: Yes

## 2019-11-20 ENCOUNTER — Encounter: Payer: Self-pay | Admitting: Orthopedic Surgery

## 2019-11-30 ENCOUNTER — Other Ambulatory Visit: Payer: Self-pay

## 2019-11-30 ENCOUNTER — Ambulatory Visit (INDEPENDENT_AMBULATORY_CARE_PROVIDER_SITE_OTHER): Payer: Medicare PPO

## 2019-11-30 DIAGNOSIS — E538 Deficiency of other specified B group vitamins: Secondary | ICD-10-CM | POA: Diagnosis not present

## 2019-11-30 MED ORDER — CYANOCOBALAMIN 1000 MCG/ML IJ SOLN
1000.0000 ug | Freq: Once | INTRAMUSCULAR | Status: AC
  Administered 2019-11-30: 1000 ug via INTRAMUSCULAR

## 2019-11-30 NOTE — Progress Notes (Addendum)
Patient seen today for B12 injection.  Cyanocobalamin 1049mcg given in right deltoid.  Patient tolerated well.  No other concerns at this time.  Agree.  Thanks.  I attest this note/injection.  Elsie Stain

## 2019-12-08 ENCOUNTER — Telehealth: Payer: Self-pay | Admitting: Family Medicine

## 2019-12-08 DIAGNOSIS — Z111 Encounter for screening for respiratory tuberculosis: Secondary | ICD-10-CM

## 2019-12-08 NOTE — Telephone Encounter (Signed)
I put in the order.  If the patient is going to need any additional paperwork then please let me know.  Please schedule a lab appointment when possible.  Thanks.

## 2019-12-08 NOTE — Telephone Encounter (Signed)
Pt is requesting TB test for nursing home. Is it ok to schedule? Need as soon as possible.

## 2019-12-09 ENCOUNTER — Other Ambulatory Visit (INDEPENDENT_AMBULATORY_CARE_PROVIDER_SITE_OTHER): Payer: Medicare PPO

## 2019-12-09 ENCOUNTER — Other Ambulatory Visit: Payer: Self-pay

## 2019-12-09 DIAGNOSIS — Z111 Encounter for screening for respiratory tuberculosis: Secondary | ICD-10-CM | POA: Diagnosis not present

## 2019-12-09 NOTE — Telephone Encounter (Signed)
Discussed with pt and scheduled for lab apt today.

## 2019-12-11 LAB — QUANTIFERON-TB GOLD PLUS
Mitogen-NIL: >10 IU/mL
NIL: 0.07 IU/mL
QuantiFERON-TB Gold Plus: NEGATIVE
TB1-NIL: <0 IU/mL
TB2-NIL: <0 IU/mL

## 2019-12-13 ENCOUNTER — Telehealth: Payer: Self-pay | Admitting: Family Medicine

## 2019-12-13 NOTE — Telephone Encounter (Signed)
Patient called in stating he would like a copy of his TB test to be mailed to him. Patient requesting paper copy, not mychart. Please advise.

## 2019-12-14 ENCOUNTER — Other Ambulatory Visit: Payer: Self-pay

## 2019-12-14 ENCOUNTER — Ambulatory Visit (INDEPENDENT_AMBULATORY_CARE_PROVIDER_SITE_OTHER): Payer: Medicare PPO

## 2019-12-14 DIAGNOSIS — E538 Deficiency of other specified B group vitamins: Secondary | ICD-10-CM

## 2019-12-14 MED ORDER — CYANOCOBALAMIN 1000 MCG/ML IJ SOLN
1000.0000 ug | Freq: Once | INTRAMUSCULAR | Status: AC
  Administered 2019-12-14: 1000 ug via INTRAMUSCULAR

## 2019-12-14 NOTE — Progress Notes (Signed)
Per orders of Dr. Damita Dunnings, injection of B12 given by Judeth Horn, CMA Patient tolerated injection well.

## 2019-12-14 NOTE — Telephone Encounter (Signed)
Printed and mailed

## 2019-12-17 ENCOUNTER — Encounter: Payer: Self-pay | Admitting: Family Medicine

## 2019-12-17 ENCOUNTER — Telehealth: Payer: Self-pay

## 2019-12-17 NOTE — Telephone Encounter (Signed)
Would give every 2 weeks.  Thanks.

## 2019-12-17 NOTE — Telephone Encounter (Signed)
Patient contacted the office and states that he is wanting to schedule his B12 shot. He states he was getting these every 2 weeks, and then his schedule got messed up and he went back to once a month. He is wondering if he needs to be scheduled every 2 weeks or every month for these b12 injections? Please advise.

## 2019-12-19 NOTE — Telephone Encounter (Signed)
See my chart message.  Please call patient.  I sent him a message in the meantime.    See what details you can get about the potential placement for his wife.  I think that is what he is contacting us about.  Thanks.

## 2019-12-20 NOTE — Telephone Encounter (Signed)
Spoke with Richard King who says he has checked with Richard King and last week, they had an opening but he doesn't know if that will be the case when he needs to place her there.  Richard King is asking if Medicare will pay for it and says that Richard King said we could help him.

## 2019-12-20 NOTE — Telephone Encounter (Addendum)
I can fill out the FL2 to get her admitted but I can't guarantee the bed availability.  That is the best I know at this point.  I don't think she can be admitted without an FL2.  If he has already done the bed search then I'll start working on the [facility] for her.  Does he know the proposed admission date for her?

## 2019-12-20 NOTE — Telephone Encounter (Signed)
Not yet.  He was going to the facility this afternoon to make more definitive plans.

## 2019-12-22 ENCOUNTER — Telehealth: Payer: Self-pay | Admitting: *Deleted

## 2019-12-22 DIAGNOSIS — E538 Deficiency of other specified B group vitamins: Secondary | ICD-10-CM

## 2019-12-22 MED ORDER — CYANOCOBALAMIN 1000 MCG/ML IJ SOLN: INTRAMUSCULAR | Status: DC

## 2019-12-22 NOTE — Telephone Encounter (Signed)
Patient is scheduled for a B-12 shot on 12/28/19. Patient had a B-12 on 12/14/19. Please confirm how often patient is to received the B-12 shots.

## 2019-12-22 NOTE — Telephone Encounter (Signed)
Would give every 2 weeks.  Thanks.

## 2019-12-23 ENCOUNTER — Telehealth: Payer: Self-pay | Admitting: Family Medicine

## 2019-12-23 NOTE — Telephone Encounter (Signed)
Husband advised that form is ready.

## 2019-12-23 NOTE — Telephone Encounter (Signed)
Requested info faxed to [employer] at [place], [place].

## 2019-12-23 NOTE — Telephone Encounter (Signed)
I filled out as much of the FL 2 as I could.  Please attach a copy of his wife's medication list.  Thanks.

## 2019-12-23 NOTE — Telephone Encounter (Signed)
Deatra Ina from [facility] called stating she needs a copy of patients last 2 office visit and a request for [facility] form faxed to [phone].

## 2019-12-26 NOTE — Progress Notes (Signed)
Your procedure is scheduled on Thursday, July 29th.  Report to [facility] Main Entrance "A" at 10:15 A.M.., and check in at the Admitting office.  Call this number if you have problems the morning of surgery:  [phone]  Call [phone] if you have any questions prior to your surgery date Monday-Friday 8am-4pm   Remember:  Do not eat after midnight the night before your surgery  You may drink clear liquids until 9:15 A.M. the morning of your surgery.   Clear liquids allowed are: Water, Non-Citrus Juices (without pulp), Carbonated Beverages, Clear Tea, Black Coffee Only, and Gatorade    Enhanced Recovery after Surgery for Orthopedics Enhanced Recovery after Surgery is a protocol used to improve the stress on your body and your recovery after surgery.  Patient Instructions  . The night before surgery:  o No food after midnight. ONLY clear liquids after midnight  .  Marland Kitchen The day of surgery (if you do NOT have diabetes):  o Drink ONE (1) Pre-Surgery Clear Ensure by 9:15 A.M.the morning of surgery  o This drink was given to you during your hospital  pre-op appointment visit. o Nothing else to drink after completing the  Pre-Surgery Clear Ensure.   Take these medicines the morning of surgery with A SIP OF WATER   If needed - traMADol (ULTRAM)  As of today, STOP taking any Aspirin (unless otherwise instructed by your surgeon) Aleve, Naproxen, Ibuprofen, Motrin, Advil, Goody's, BC's, all herbal medications, fish oil, and all vitamins.             Do not wear jewelry.            Do not wear lotions, powders, colognes, or deodorant.            Men may shave face and neck.            Do not bring valuables to the hospital.            [facility] is not responsible for any belongings or valuables.  Do NOT Smoke (Tobacco/Vaping) or drink Alcohol 24 hours prior to your procedure If you use a CPAP at night, you may bring all equipment for your overnight stay.   Contacts, glasses,  dentures or bridgework may not be worn into surgery.      For patients admitted to the hospital, discharge time will be determined by your treatment team.   Patients discharged the day of surgery will not be allowed to drive home, and someone needs to stay with them for 24 hours.  Special instructions:   [facility] [place]- Preparing For Surgery  Before surgery, you can play an important role. Because skin is not sterile, your skin needs to be as free of germs as possible. You can reduce the number of germs on your skin by washing with CHG (chlorahexidine gluconate) Soap before surgery.  CHG is an antiseptic cleaner which kills germs and bonds with the skin to continue killing germs even after washing.    Oral Hygiene is also important to reduce your risk of infection.  Remember - BRUSH YOUR TEETH THE MORNING OF SURGERY WITH YOUR REGULAR TOOTHPASTE  Please do not use if you have an allergy to CHG or antibacterial soaps. If your skin becomes reddened/irritated stop using the CHG.  Do not shave (including legs and underarms) for at least 48 hours prior to first CHG shower. It is OK to shave your face.  Please follow these instructions carefully.   1. Shower the  NIGHT BEFORE SURGERY and the MORNING OF SURGERY with CHG Soap.   2. If you chose to wash your hair, wash your hair first as usual with your normal shampoo.  3. After you shampoo, rinse your hair and body thoroughly to remove the shampoo.  4. Use CHG as you would any other liquid soap. You can apply CHG directly to the skin and wash gently with a scrungie or a clean washcloth.   5. Apply the CHG Soap to your body ONLY FROM THE NECK DOWN.  Do not use on open wounds or open sores. Avoid contact with your eyes, ears, mouth and genitals (private parts). Wash Face and genitals (private parts)  with your normal soap.   6. Wash thoroughly, paying special attention to the area where your surgery will be performed.  7. Thoroughly rinse your body  with warm water from the neck down.  8. DO NOT shower/wash with your normal soap after using and rinsing off the CHG Soap.  9. Pat yourself dry with a CLEAN TOWEL.  10. Wear CLEAN PAJAMAS to bed the night before surgery  11. Place CLEAN SHEETS on your bed the night of your first shower and DO NOT SLEEP WITH PETS.  Day of Surgery: Wear Clean/Comfortable clothing the morning of surgery Do not apply any deodorants/lotions.   Remember to brush your teeth WITH YOUR REGULAR TOOTHPASTE.   Please read over the following fact sheets that you were given.

## 2019-12-27 ENCOUNTER — Other Ambulatory Visit (HOSPITAL_COMMUNITY)
Admission: RE | Admit: 2019-12-27 | Discharge: 2019-12-27 | Disposition: A | Discharge status: Home / Self Care | Payer: Medicare PPO | Source: Ambulatory Visit | Attending: Orthopedic Surgery | Admitting: Orthopedic Surgery

## 2019-12-27 ENCOUNTER — Encounter (HOSPITAL_COMMUNITY): Payer: Self-pay

## 2019-12-27 ENCOUNTER — Other Ambulatory Visit: Payer: Self-pay

## 2019-12-27 ENCOUNTER — Telehealth: Payer: Self-pay | Admitting: Family Medicine

## 2019-12-27 ENCOUNTER — Encounter (HOSPITAL_COMMUNITY)
Admission: RE | Admit: 2019-12-27 | Discharge: 2019-12-27 | Disposition: A | Discharge status: Home / Self Care | Payer: Medicare PPO | Source: Ambulatory Visit | Attending: Orthopedic Surgery | Admitting: Orthopedic Surgery

## 2019-12-27 DIAGNOSIS — Z01812 Encounter for preprocedural laboratory examination: Secondary | ICD-10-CM | POA: Insufficient documentation

## 2019-12-27 DIAGNOSIS — M19012 Primary osteoarthritis, left shoulder: Secondary | ICD-10-CM | POA: Insufficient documentation

## 2019-12-27 DIAGNOSIS — R9431 Abnormal electrocardiogram [ECG] [EKG]: Secondary | ICD-10-CM | POA: Insufficient documentation

## 2019-12-27 DIAGNOSIS — E538 Deficiency of other specified B group vitamins: Secondary | ICD-10-CM | POA: Diagnosis not present

## 2019-12-27 DIAGNOSIS — E669 Obesity, unspecified: Secondary | ICD-10-CM | POA: Diagnosis not present

## 2019-12-27 DIAGNOSIS — Z79899 Other long term (current) drug therapy: Secondary | ICD-10-CM | POA: Insufficient documentation

## 2019-12-27 DIAGNOSIS — Z20822 Contact with and (suspected) exposure to covid-19: Secondary | ICD-10-CM | POA: Diagnosis not present

## 2019-12-27 DIAGNOSIS — Z01818 Encounter for other preprocedural examination: Secondary | ICD-10-CM | POA: Diagnosis not present

## 2019-12-27 DIAGNOSIS — Z6832 Body mass index (BMI) 32.0-32.9, adult: Secondary | ICD-10-CM | POA: Diagnosis not present

## 2019-12-27 DIAGNOSIS — Z87891 Personal history of nicotine dependence: Secondary | ICD-10-CM | POA: Diagnosis not present

## 2019-12-27 DIAGNOSIS — Z01810 Encounter for preprocedural cardiovascular examination: Secondary | ICD-10-CM | POA: Insufficient documentation

## 2019-12-27 DIAGNOSIS — I10 Essential (primary) hypertension: Secondary | ICD-10-CM | POA: Insufficient documentation

## 2019-12-27 HISTORY — DX: Pneumonia, unspecified organism: J18.9

## 2019-12-27 HISTORY — DX: Personal history of urinary calculi: Z87.442

## 2019-12-27 LAB — CBC
HCT: 42.4 % (ref 39.0–52.0)
Hemoglobin: 13.9 g/dL (ref 13.0–17.0)
MCH: 32 pg (ref 26.0–34.0)
MCHC: 32.8 g/dL (ref 30.0–36.0)
MCV: 97.5 fL (ref 80.0–100.0)
Platelets: 213 10*3/uL (ref 150–400)
RBC: 4.35 MIL/uL (ref 4.22–5.81)
RDW: 13 % (ref 11.5–15.5)
WBC: 6.7 10*3/uL (ref 4.0–10.5)
nRBC: 0 % (ref 0.0–0.2)

## 2019-12-27 LAB — BASIC METABOLIC PANEL
Anion gap: 9 (ref 5–15)
BUN: 12 mg/dL (ref 8–23)
CO2: 25 mmol/L (ref 22–32)
Calcium: 9.3 mg/dL (ref 8.9–10.3)
Chloride: 107 mmol/L (ref 98–111)
Creatinine, Ser: 0.84 mg/dL (ref 0.61–1.24)
GFR calc Af Amer: >60 mL/min (ref >60)
GFR calc non Af Amer: >60 mL/min (ref >60)
Glucose, Bld: 96 mg/dL (ref 70–99)
Potassium: 4.7 mmol/L (ref 3.5–5.1)
Sodium: 141 mmol/L (ref 135–145)

## 2019-12-27 LAB — URINALYSIS, ROUTINE W REFLEX MICROSCOPIC
Bacteria, UA: NONE SEEN
Bilirubin Urine: NEGATIVE
Glucose, UA: NEGATIVE mg/dL
Ketones, ur: NEGATIVE mg/dL
Leukocytes,Ua: NEGATIVE
Nitrite: NEGATIVE
Protein, ur: NEGATIVE mg/dL
Specific Gravity, Urine: 1.019 (ref 1.005–1.030)
pH: 6 (ref 5.0–8.0)

## 2019-12-27 LAB — SURGICAL PCR SCREEN
MRSA, PCR: NEGATIVE
Staphylococcus aureus: POSITIVE — AB

## 2019-12-27 LAB — SARS CORONAVIRUS 2 (TAT 6-24 HRS): SARS Coronavirus 2: NEGATIVE

## 2019-12-27 NOTE — Telephone Encounter (Signed)
Patient called in stating he is needing to have an FL2 form filled out for today or tomorrow. Patient states he would like a call when done. Did advise patient of standard turn around time and that the form is needing to be provided. Patient stated he was told that we need to provide the form. Patient disconnected before able to gather more info.

## 2019-12-27 NOTE — Telephone Encounter (Signed)
Patient's wife's FL2 form is ready and placed at front desk for pickup.  Patient advised.

## 2019-12-27 NOTE — Telephone Encounter (Signed)
FL2 form is for spouse hilda Lumpkin.   Put phone note on pt chart

## 2019-12-27 NOTE — Progress Notes (Signed)
Your procedure is scheduled on Thursday, July 29th.  Report to [facility] Main Entrance "A" at 10:15 A.M.., and check in at the Admitting office.  Call this number if you have problems the morning of surgery:  [phone]  Call [phone] if you have any questions prior to your surgery date Monday-Friday 8am-4pm   Remember:  Do not eat after midnight the night before your surgery  You may drink clear liquids until 9:15 A.M. the morning of your surgery.   Clear liquids allowed are: Water, Non-Citrus Juices (without pulp), Carbonated Beverages, Clear Tea, Black Coffee Only, and Gatorade    Enhanced Recovery after Surgery for Orthopedics Enhanced Recovery after Surgery is a protocol used to improve the stress on your body and your recovery after surgery.  Patient Instructions  . The night before surgery:  o No food after midnight. ONLY clear liquids after midnight  .  Marland Kitchen The day of surgery (if you do NOT have diabetes):  o Drink ONE (1) Pre-Surgery Clear Ensure by 9:15 A.M.the morning of surgery  o This drink was given to you during your hospital  pre-op appointment visit. o Nothing else to drink after completing the  Pre-Surgery Clear Ensure.   Take these medicines the morning of surgery with A SIP OF WATER   If needed - traMADol (ULTRAM)  As of today, STOP taking any Aspirin (unless otherwise instructed by your surgeon) Aleve, Naproxen, Ibuprofen, Motrin, Advil, Goody's, BC's, all herbal medications, fish oil, and all vitamins.             Do not wear jewelry.            Do not wear lotions, powders, colognes, or deodorant.            Men may shave face and neck.            Do not bring valuables to the hospital.            [facility] is not responsible for any belongings or valuables.  Do NOT Smoke (Tobacco/Vaping) or drink Alcohol 24 hours prior to your procedure  If you use a CPAP at night, you may bring all equipment for your overnight stay.   Contacts, glasses,  dentures or bridgework may not be worn into surgery.      For patients admitted to the hospital, discharge time will be determined by your treatment team.   Patients discharged the day of surgery will not be allowed to drive home, and someone needs to stay with them for 24 hours.  Special instructions:    [place]- Preparing for Total Shoulder Arthroplasty  Before surgery, you can play an important role. Because skin is not sterile, your skin needs to be as free of germs as possible. You can reduce the number of germs on your skin by using the following products.   Benzoyl Peroxide Gel  o Reduces the number of germs present on the skin  o Applied twice a day to shoulder area starting two days before surgery   Chlorhexidine Gluconate (CHG) Soap (instructions listed above on how to wash with CHG Soap)  o An antiseptic cleaner that kills germs and bonds with the skin to continue killing germs even after washing  o Used for showering the night before surgery and morning of surgery   ==================================================================  Please follow these instructions carefully:  BENZOYL PEROXIDE 5% GEL  Please do not use if you have an allergy to benzoyl peroxide. If your skin  becomes reddened/irritated stop using the benzoyl peroxide.  Starting two days before surgery, apply as follows:  1. Apply benzoyl peroxide in the morning and at night. Apply after taking a shower. If you are not taking a shower clean entire shoulder front, back, and side along with the armpit with a clean wet washcloth.  2. Place a quarter-sized dollop on your SHOULDER and rub in thoroughly, making sure to cover the front, back, and side of your shoulder, along with the armpit.   2 Days prior to Surgery First Dose on Tuesday Morning 12/28/2019 Second Dose on Tuesday Night 12/28/2019  Day Before Surgery First Dose on Wednesday Morning 12/29/2019 Night before surgery wash (entire body  except face and private areas) with CHG Soap THEN Second Dose on Wednesday Night 12/29/2019  Morning of Surgery 12/30/2019 wash BODY AGAIN with CHG Soap   4. Do NOT apply benzoyl peroxide gel on the day of surgery   [place]- Preparing For Surgery  Before surgery, you can play an important role. Because skin is not sterile, your skin needs to be as free of germs as possible. You can reduce the number of germs on your skin by washing with CHG (chlorahexidine gluconate) Soap before surgery.  CHG is an antiseptic cleaner which kills germs and bonds with the skin to continue killing germs even after washing.    Oral Hygiene is also important to reduce your risk of infection.  Remember - BRUSH YOUR TEETH THE MORNING OF SURGERY WITH YOUR REGULAR TOOTHPASTE  Please do not use if you have an allergy to CHG or antibacterial soaps. If your skin becomes reddened/irritated stop using the CHG.  Do not shave (including legs and underarms) for at least 48 hours prior to first CHG shower. It is OK to shave your face.  Please follow these instructions carefully.   1. Shower the NIGHT BEFORE SURGERY and the MORNING OF SURGERY with CHG Soap.   2. If you chose to wash your hair, wash your hair first as usual with your normal shampoo.  3. After you shampoo, rinse your hair and body thoroughly to remove the shampoo.  4. Use CHG as you would any other liquid soap. You can apply CHG directly to the skin and wash gently with a scrungie or a clean washcloth.   5. Apply the CHG Soap to your body ONLY FROM THE NECK DOWN.  Do not use on open wounds or open sores. Avoid contact with your eyes, ears, mouth and genitals (private parts). Wash Face and genitals (private parts)  with your normal soap.   6. Wash thoroughly, paying special attention to the area where your surgery will be performed.  7. Thoroughly rinse your body with warm water from the neck down.  8. DO NOT shower/wash with your normal soap after  using and rinsing off the CHG Soap.  9. Pat yourself dry with a CLEAN TOWEL.  10. Wear CLEAN PAJAMAS to bed the night before surgery  11. Place CLEAN SHEETS on your bed the night of your first shower and DO NOT SLEEP WITH PETS.  Day of Surgery: Wear Clean/Comfortable clothing the morning of surgery Do not apply any deodorants/lotions.   Remember to brush your teeth WITH YOUR REGULAR TOOTHPASTE.   Please read over the following fact sheets that you were given.

## 2019-12-27 NOTE — Progress Notes (Signed)
PCP - Dr. Damita Dunnings, [facility] Cardiologist - patient denies  PPM/ICD - n/a Device Orders -  Rep Notified -   Chest x-ray - n/a EKG - 12/27/2019 Stress Test - patient denies ECHO - patient denies Cardiac Cath - patient denies  Sleep Study - n/a CPAP -   Fasting Blood Sugar - n/a Checks Blood Sugar _____ times a day  Blood Thinner Instructions: n/a Aspirin Instructions: n/a  ERAS Protcol - clears by 0915 PRE-SURGERY Ensure or G2- Ensure provided to patient  COVID TEST- after PAT appointment   Anesthesia review: n/a  Patient denies shortness of breath, fever, cough and chest pain at PAT appointment   All instructions explained to the patient, with a verbal understanding of the material. Patient agrees to go over the instructions while at home for a better understanding. Patient also instructed to self quarantine after being tested for COVID-19. The opportunity to ask questions was provided.

## 2019-12-28 ENCOUNTER — Ambulatory Visit (INDEPENDENT_AMBULATORY_CARE_PROVIDER_SITE_OTHER): Payer: Medicare PPO

## 2019-12-28 DIAGNOSIS — E538 Deficiency of other specified B group vitamins: Secondary | ICD-10-CM

## 2019-12-28 LAB — URINE CULTURE: Culture: NO GROWTH

## 2019-12-28 MED ORDER — CYANOCOBALAMIN 1000 MCG/ML IJ SOLN
1000.0000 ug | Freq: Once | INTRAMUSCULAR | Status: AC
  Administered 2019-12-28: 1000 ug via INTRAMUSCULAR

## 2019-12-28 NOTE — Progress Notes (Signed)
Anesthesia Chart Review:  Case: [id number] Date/Time: 12/30/19 1200   Procedure: LEFT REVERSE SHOULDER ARTHROPLASTY (Left Shoulder)   Anesthesia type: General   Pre-op diagnosis: left shoulder osteoarthritis   [facility]: [facility] OR ROOM 07 / [facility] OR   Surgeons: Meredith Pel, MD      DISCUSSION: Patient is a 79 year old male scheduled for the above procedure.  History includes former smoker (quit 1978), HTN, BPH, bladder cancer (s/p TURBT 01/03/10, 02/19/10; biopsy of bladder wall/prostatic urethra, 10/01/10), nephrolithiasis (s/p lithotripsy/cystorethroscopy 12/2009). BMI is consistent with obesity.   He denied shortness of breath, cough, fever, chest pain at PAT RN visit.  EKG appears stable.  Labs acceptable, and 12/27/2019 presurgical COVID-19 test negative.  Anesthesia team to evaluate on the day of surgery.   VS: BP (!) 163/93   Pulse 76   Temp 36.9 C (Oral)   Resp 20   Ht 5\' 11"  (1.803 m)   Wt (!) 105.9 kg   SpO2 95%   BMI 32.58 kg/m   PROVIDERS: Tonia Ghent, MD is PCP    LABS: Labs reviewed: Acceptable for surgery. (all labs ordered are listed, but only abnormal results are displayed)  Labs Reviewed  SURGICAL PCR SCREEN - Abnormal; Notable for the following components:      Result Value   Staphylococcus aureus POSITIVE (*)    All other components within normal limits  URINALYSIS, ROUTINE W REFLEX MICROSCOPIC - Abnormal; Notable for the following components:   Hgb urine dipstick SMALL (*)    All other components within normal limits  URINE CULTURE  BASIC METABOLIC PANEL  CBC     IMAGES: CT left shoulder 11/05/19: IMPRESSION: 1. Severe osteoarthritis of the left glenohumeral joint. 2. Severe degenerative disc disease with disc height loss at C5-6 and C6-7 with left uncovertebral degenerative changes and foraminal stenosis. 3. Aortic Atherosclerosis (ICD10-I70.0).  CTA head/neck 12/05/17: IMPRESSION: 1. Minimal head and neck atherosclerosis with no arterial  stenosis. 2. Generalized arterial tortuosity/ectasia. This is most pronounced at the great vessels which have a kinked appearance at the thoracic inlet. 3. Negative for intracranial aneurysm. There is mild ectasia at the anterior communicating artery-right ACA junction. 4. No acute intracranial abnormality. CT appearance of the brain is stable to the May MRI. 5. Moderate cervical spine degeneration including facet arthropathy related to C4-C5 mild anterolisthesis and lower cervical disc and endplate degeneration.   EKG: Sinus rhythm with 1st degree A-V block Left axis deviation Abnormal ECG No significant change since last tracing [10/19/17] Confirmed by Kirk Ruths [id number]) on 12/27/2019 3:28:37 PM - EKG (LAD/LAFB, first degree AVB) appears stable when compared to tracings from 10/19/17 and 12/15/09   CV: He denied prior stress test, echocardiogram, cardiac cath  Past Medical History:  Diagnosis Date  . Arthritis   . B12 deficiency   . Bladder cancer ([place])   . BPH (benign prostatic hypertrophy)   . ED (erectile dysfunction)   . History of kidney stones   . History of nephrolithiasis   . Hx of colonic polyps   . Hypertension   . Pneumonia    as a child    Past Surgical History:  Procedure Laterality Date  . bladder cancer shaving     hx bladder cancer  . CATARACT EXTRACTION    . COLONOSCOPY    . LITHOTRIPSY     per Dr. Risa Grill  . POLYPECTOMY  2009   HPP  . 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 cyanocobalamin (,VITAMIN B-12,) 1000 MCG/ML injection  . doxazosin (CARDURA) 1 MG tablet  . sertraline (ZOLOFT) 25 MG tablet  . traMADol (ULTRAM) 50 MG tablet  . verapamil (CALAN-SR) 120 MG CR tablet   No current facility-administered medications for this encounter.    Myra Gianotti, PA-C Surgical Short Stay/Anesthesiology [facility] Phone [phone] [facility] Phone [phone] 12/29/2019 9:19  AM

## 2019-12-28 NOTE — Progress Notes (Signed)
Per orders of Dr. Duncan, injection of B12 given by Tayt Moyers. Patient tolerated injection well.  

## 2019-12-29 ENCOUNTER — Encounter: Payer: Self-pay | Admitting: Orthopedic Surgery

## 2019-12-29 NOTE — Anesthesia Preprocedure Evaluation (Addendum)
Anesthesia Evaluation  Patient identified by MRN, date of birth, ID band Patient awake    Reviewed: Allergy & Precautions, NPO status , Patient's Chart, lab work & pertinent test results  History of Anesthesia Complications Negative for: history of anesthetic complications  Airway Mallampati: II  TM Distance: >3 FB Neck ROM: Full    Dental  (+) Missing,    Pulmonary former smoker,    Pulmonary exam normal        Cardiovascular hypertension, Pt. on medications Normal cardiovascular exam     Neuro/Psych negative neurological ROS  negative psych ROS   GI/Hepatic negative GI ROS, Neg liver ROS,   Endo/Other  negative endocrine ROS  Renal/GU negative Renal ROS  negative genitourinary   Musculoskeletal  (+) Arthritis ,   Abdominal   Peds  Hematology negative hematology ROS (+)   Anesthesia Other Findings Day of surgery medications reviewed with patient.  Reproductive/Obstetrics negative OB ROS                            Anesthesia Physical Anesthesia Plan  ASA: II  Anesthesia Plan: General   Post-op Pain Management: GA combined w/ Regional for post-op pain   Induction: Intravenous  PONV Risk Score and Plan: 2 and Treatment may vary due to age or medical condition, Ondansetron and Dexamethasone  Airway Management Planned: Oral ETT  Additional Equipment: None  Intra-op Plan:   Post-operative Plan: Extubation in OR  Informed Consent:   Plan Discussed with:   Anesthesia Plan Comments: (PAT note written by Myra Gianotti, PA-C. )       Anesthesia Quick Evaluation

## 2019-12-30 ENCOUNTER — Observation Stay (HOSPITAL_COMMUNITY)
Admission: RE | Admit: 2019-12-30 | Discharge: 2020-01-06 | Disposition: A | Discharge status: Home / Self Care | Payer: Medicare PPO | Attending: Orthopedic Surgery | Admitting: Orthopedic Surgery

## 2019-12-30 ENCOUNTER — Other Ambulatory Visit: Payer: Self-pay

## 2019-12-30 ENCOUNTER — Encounter (HOSPITAL_COMMUNITY)
Admission: RE | Disposition: A | Discharge status: Home / Self Care | Payer: Self-pay | Source: Home / Self Care | Attending: Orthopedic Surgery

## 2019-12-30 ENCOUNTER — Ambulatory Visit (HOSPITAL_COMMUNITY): Payer: Medicare PPO | Admitting: Vascular Surgery

## 2019-12-30 ENCOUNTER — Encounter (HOSPITAL_COMMUNITY): Payer: Self-pay | Admitting: Orthopedic Surgery

## 2019-12-30 ENCOUNTER — Observation Stay (HOSPITAL_COMMUNITY): Payer: Medicare PPO

## 2019-12-30 ENCOUNTER — Ambulatory Visit (HOSPITAL_COMMUNITY): Payer: Medicare PPO | Admitting: Certified Registered"

## 2019-12-30 DIAGNOSIS — M19012 Primary osteoarthritis, left shoulder: Principal | ICD-10-CM | POA: Insufficient documentation

## 2019-12-30 DIAGNOSIS — Z8551 Personal history of malignant neoplasm of bladder: Secondary | ICD-10-CM | POA: Diagnosis not present

## 2019-12-30 DIAGNOSIS — N400 Enlarged prostate without lower urinary tract symptoms: Secondary | ICD-10-CM | POA: Diagnosis not present

## 2019-12-30 DIAGNOSIS — Z9889 Other specified postprocedural states: Secondary | ICD-10-CM

## 2019-12-30 DIAGNOSIS — I10 Essential (primary) hypertension: Secondary | ICD-10-CM | POA: Diagnosis not present

## 2019-12-30 DIAGNOSIS — G8918 Other acute postprocedural pain: Secondary | ICD-10-CM | POA: Diagnosis not present

## 2019-12-30 DIAGNOSIS — M25512 Pain in left shoulder: Secondary | ICD-10-CM | POA: Diagnosis present

## 2019-12-30 DIAGNOSIS — Z7982 Long term (current) use of aspirin: Secondary | ICD-10-CM | POA: Diagnosis not present

## 2019-12-30 DIAGNOSIS — Z20822 Contact with and (suspected) exposure to covid-19: Secondary | ICD-10-CM | POA: Insufficient documentation

## 2019-12-30 DIAGNOSIS — Z87891 Personal history of nicotine dependence: Secondary | ICD-10-CM | POA: Diagnosis not present

## 2019-12-30 DIAGNOSIS — Z96612 Presence of left artificial shoulder joint: Secondary | ICD-10-CM | POA: Diagnosis not present

## 2019-12-30 DIAGNOSIS — R399 Unspecified symptoms and signs involving the genitourinary system: Secondary | ICD-10-CM | POA: Diagnosis present

## 2019-12-30 DIAGNOSIS — R600 Localized edema: Secondary | ICD-10-CM | POA: Diagnosis not present

## 2019-12-30 DIAGNOSIS — K56609 Unspecified intestinal obstruction, unspecified as to partial versus complete obstruction: Secondary | ICD-10-CM

## 2019-12-30 DIAGNOSIS — K5900 Constipation, unspecified: Secondary | ICD-10-CM

## 2019-12-30 DIAGNOSIS — Z471 Aftercare following joint replacement surgery: Secondary | ICD-10-CM | POA: Diagnosis not present

## 2019-12-30 HISTORY — PX: REVERSE SHOULDER ARTHROPLASTY: SHX5054

## 2019-12-30 SURGERY — ARTHROPLASTY, SHOULDER, TOTAL, REVERSE
Anesthesia: General | Site: Shoulder | Laterality: Left

## 2019-12-30 MED ORDER — CHLORHEXIDINE GLUCONATE 0.12 % MT SOLN
15.0000 mL | Freq: Once | OROMUCOSAL | Status: AC
  Administered 2019-12-30: 15 mL via OROMUCOSAL
  Filled 2019-12-30: qty 15

## 2019-12-30 MED ORDER — PROPOFOL 10 MG/ML IV BOLUS
INTRAVENOUS | Status: AC
  Filled 2019-12-30: qty 20

## 2019-12-30 MED ORDER — ACETAMINOPHEN 500 MG PO TABS
1000.0000 mg | ORAL_TABLET | Freq: Once | ORAL | Status: AC
  Administered 2019-12-30: 1000 mg via ORAL
  Filled 2019-12-30: qty 2

## 2019-12-30 MED ORDER — ONDANSETRON HCL 4 MG/2ML IJ SOLN
INTRAMUSCULAR | Status: AC
  Filled 2019-12-30: qty 2

## 2019-12-30 MED ORDER — CEFAZOLIN SODIUM-DEXTROSE 2-4 GM/100ML-% IV SOLN
2.0000 g | Freq: Four times a day (QID) | INTRAVENOUS | Status: AC
  Administered 2019-12-30 – 2019-12-31 (×3): 2 g via INTRAVENOUS
  Filled 2019-12-30 (×3): qty 100

## 2019-12-30 MED ORDER — GLYCOPYRROLATE PF 0.2 MG/ML IJ SOSY
PREFILLED_SYRINGE | INTRAMUSCULAR | Status: DC | PRN
  Administered 2019-12-30 (×2): .2 mg via INTRAVENOUS

## 2019-12-30 MED ORDER — METOCLOPRAMIDE HCL 5 MG/ML IJ SOLN: 5.0000 mg | Freq: Three times a day (TID) | INTRAMUSCULAR | Status: DC | PRN

## 2019-12-30 MED ORDER — EPHEDRINE SULFATE-NACL 50-0.9 MG/10ML-% IV SOSY
PREFILLED_SYRINGE | INTRAVENOUS | Status: DC | PRN
  Administered 2019-12-30: 10 mg via INTRAVENOUS

## 2019-12-30 MED ORDER — FENTANYL CITRATE (PF) 100 MCG/2ML IJ SOLN
INTRAMUSCULAR | Status: AC
  Administered 2019-12-30: 50 ug via INTRAVENOUS
  Filled 2019-12-30: qty 2

## 2019-12-30 MED ORDER — CEFAZOLIN SODIUM-DEXTROSE 2-4 GM/100ML-% IV SOLN
2.0000 g | INTRAVENOUS | Status: AC
  Administered 2019-12-30: 2 g via INTRAVENOUS
  Filled 2019-12-30: qty 100

## 2019-12-30 MED ORDER — HYDROMORPHONE HCL 1 MG/ML IJ SOLN: 0.5000 mg | INTRAMUSCULAR | Status: DC | PRN

## 2019-12-30 MED ORDER — FENTANYL CITRATE (PF) 250 MCG/5ML IJ SOLN
INTRAMUSCULAR | Status: AC
  Filled 2019-12-30: qty 5

## 2019-12-30 MED ORDER — FENTANYL CITRATE (PF) 100 MCG/2ML IJ SOLN: 25.0000 ug | INTRAMUSCULAR | Status: DC | PRN

## 2019-12-30 MED ORDER — 0.9 % SODIUM CHLORIDE (POUR BTL) OPTIME
TOPICAL | Status: DC | PRN
  Administered 2019-12-30: 1000 mL

## 2019-12-30 MED ORDER — ROCURONIUM BROMIDE 10 MG/ML (PF) SYRINGE
PREFILLED_SYRINGE | INTRAVENOUS | Status: AC
  Filled 2019-12-30: qty 10

## 2019-12-30 MED ORDER — POVIDONE-IODINE 10 % EX SWAB
2.0000 "application " | Freq: Once | CUTANEOUS | Status: AC
  Administered 2019-12-30: 2 via TOPICAL

## 2019-12-30 MED ORDER — GLYCOPYRROLATE PF 0.2 MG/ML IJ SOSY
PREFILLED_SYRINGE | INTRAMUSCULAR | Status: AC
  Filled 2019-12-30: qty 1

## 2019-12-30 MED ORDER — BUPIVACAINE LIPOSOME 1.3 % IJ SUSP
INTRAMUSCULAR | Status: DC | PRN
Start: 2019-12-30 — End: 2019-12-30
  Administered 2019-12-30: 10 mL via PERINEURAL

## 2019-12-30 MED ORDER — ASPIRIN EC 81 MG PO TBEC
81.0000 mg | DELAYED_RELEASE_TABLET | Freq: Every day | ORAL | Status: DC
  Administered 2019-12-31 – 2020-01-06 (×7): 81 mg via ORAL
  Filled 2019-12-30 (×7): qty 1

## 2019-12-30 MED ORDER — MENTHOL 3 MG MT LOZG
1.0000 | LOZENGE | OROMUCOSAL | Status: DC | PRN
  Filled 2019-12-30 (×2): qty 9

## 2019-12-30 MED ORDER — LIDOCAINE 2% (20 MG/ML) 5 ML SYRINGE
INTRAMUSCULAR | Status: AC
  Filled 2019-12-30: qty 5

## 2019-12-30 MED ORDER — EPHEDRINE 5 MG/ML INJ
INTRAVENOUS | Status: AC
  Filled 2019-12-30: qty 10

## 2019-12-30 MED ORDER — METHOCARBAMOL 1000 MG/10ML IJ SOLN
500.0000 mg | Freq: Four times a day (QID) | INTRAVENOUS | Status: DC | PRN
  Filled 2019-12-30: qty 5

## 2019-12-30 MED ORDER — LIDOCAINE 2% (20 MG/ML) 5 ML SYRINGE
INTRAMUSCULAR | Status: DC | PRN
  Administered 2019-12-30: 40 mg via INTRAVENOUS

## 2019-12-30 MED ORDER — ROCURONIUM BROMIDE 10 MG/ML (PF) SYRINGE
PREFILLED_SYRINGE | INTRAVENOUS | Status: DC | PRN
  Administered 2019-12-30: 50 mg via INTRAVENOUS

## 2019-12-30 MED ORDER — SERTRALINE HCL 25 MG PO TABS
25.0000 mg | ORAL_TABLET | Freq: Every day | ORAL | Status: DC
  Administered 2019-12-30 – 2020-01-06 (×8): 25 mg via ORAL
  Filled 2019-12-30 (×8): qty 1

## 2019-12-30 MED ORDER — METHOCARBAMOL 500 MG PO TABS
500.0000 mg | ORAL_TABLET | Freq: Four times a day (QID) | ORAL | Status: DC | PRN
  Administered 2019-12-30: 500 mg via ORAL
  Filled 2019-12-30: qty 1

## 2019-12-30 MED ORDER — VERAPAMIL HCL ER 120 MG PO TBCR
120.0000 mg | EXTENDED_RELEASE_TABLET | Freq: Every day | ORAL | Status: DC
  Administered 2019-12-30 – 2020-01-06 (×8): 120 mg via ORAL
  Filled 2019-12-30 (×9): qty 1

## 2019-12-30 MED ORDER — LACTATED RINGERS IV SOLN: INTRAVENOUS | Status: DC

## 2019-12-30 MED ORDER — POVIDONE-IODINE 7.5 % EX SOLN
Freq: Once | CUTANEOUS | Status: AC
  Administered 2019-12-30: 1 via TOPICAL
  Filled 2019-12-30: qty 118

## 2019-12-30 MED ORDER — IRRISEPT - 450ML BOTTLE WITH 0.05% CHG IN STERILE WATER, USP 99.95% OPTIME
TOPICAL | Status: DC | PRN
  Administered 2019-12-30: 450 mL via TOPICAL

## 2019-12-30 MED ORDER — TRANEXAMIC ACID-NACL 1000-0.7 MG/100ML-% IV SOLN
1000.0000 mg | INTRAVENOUS | Status: AC
  Administered 2019-12-30: 1000 mg via INTRAVENOUS
  Filled 2019-12-30: qty 100

## 2019-12-30 MED ORDER — FENTANYL CITRATE (PF) 100 MCG/2ML IJ SOLN
INTRAMUSCULAR | Status: DC | PRN
  Administered 2019-12-30: 50 ug via INTRAVENOUS

## 2019-12-30 MED ORDER — METOCLOPRAMIDE HCL 5 MG PO TABS: 5.0000 mg | ORAL_TABLET | Freq: Three times a day (TID) | ORAL | Status: DC | PRN

## 2019-12-30 MED ORDER — ONDANSETRON HCL 4 MG/2ML IJ SOLN: 4.0000 mg | Freq: Four times a day (QID) | INTRAMUSCULAR | Status: DC | PRN

## 2019-12-30 MED ORDER — PHENOL 1.4 % MT LIQD: 1.0000 | OROMUCOSAL | Status: DC | PRN

## 2019-12-30 MED ORDER — ONDANSETRON HCL 4 MG/2ML IJ SOLN
INTRAMUSCULAR | Status: DC | PRN
  Administered 2019-12-30: 4 mg via INTRAVENOUS

## 2019-12-30 MED ORDER — DOCUSATE SODIUM 100 MG PO CAPS
100.0000 mg | ORAL_CAPSULE | Freq: Two times a day (BID) | ORAL | Status: DC
  Administered 2019-12-30 – 2020-01-06 (×15): 100 mg via ORAL
  Filled 2019-12-30 (×15): qty 1

## 2019-12-30 MED ORDER — DOXAZOSIN MESYLATE 1 MG PO TABS
2.0000 mg | ORAL_TABLET | Freq: Every day | ORAL | Status: DC
  Administered 2019-12-30 – 2020-01-06 (×8): 2 mg via ORAL
  Filled 2019-12-30 (×9): qty 2

## 2019-12-30 MED ORDER — ACETAMINOPHEN 500 MG PO TABS
1000.0000 mg | ORAL_TABLET | Freq: Four times a day (QID) | ORAL | Status: AC
  Administered 2019-12-30 – 2019-12-31 (×3): 1000 mg via ORAL
  Filled 2019-12-30 (×3): qty 2

## 2019-12-30 MED ORDER — PROPOFOL 10 MG/ML IV BOLUS
INTRAVENOUS | Status: DC | PRN
  Administered 2019-12-30: 100 mg via INTRAVENOUS

## 2019-12-30 MED ORDER — DEXAMETHASONE SODIUM PHOSPHATE 10 MG/ML IJ SOLN
INTRAMUSCULAR | Status: DC | PRN
  Administered 2019-12-30: 5 mg via INTRAVENOUS

## 2019-12-30 MED ORDER — DEXAMETHASONE SODIUM PHOSPHATE 10 MG/ML IJ SOLN
INTRAMUSCULAR | Status: AC
  Filled 2019-12-30: qty 1

## 2019-12-30 MED ORDER — PROMETHAZINE HCL 25 MG/ML IJ SOLN: 6.2500 mg | INTRAMUSCULAR | Status: DC | PRN

## 2019-12-30 MED ORDER — PHENYLEPHRINE HCL-NACL 10-0.9 MG/250ML-% IV SOLN
INTRAVENOUS | Status: DC | PRN
Start: 2019-12-30 — End: 2019-12-30
  Administered 2019-12-30: 25 ug/min via INTRAVENOUS

## 2019-12-30 MED ORDER — ORAL CARE MOUTH RINSE: 15.0000 mL | Freq: Once | OROMUCOSAL | Status: AC

## 2019-12-30 MED ORDER — ARTIFICIAL TEARS OPHTHALMIC OINT
TOPICAL_OINTMENT | OPHTHALMIC | Status: AC
  Filled 2019-12-30: qty 3.5

## 2019-12-30 MED ORDER — VANCOMYCIN HCL 1000 MG IV SOLR
INTRAVENOUS | Status: AC
  Filled 2019-12-30: qty 1000

## 2019-12-30 MED ORDER — ONDANSETRON HCL 4 MG PO TABS: 4.0000 mg | ORAL_TABLET | Freq: Four times a day (QID) | ORAL | Status: DC | PRN

## 2019-12-30 MED ORDER — FENTANYL CITRATE (PF) 100 MCG/2ML IJ SOLN: 50.0000 ug | Freq: Once | INTRAMUSCULAR | Status: AC

## 2019-12-30 MED ORDER — MIDAZOLAM HCL 2 MG/2ML IJ SOLN
INTRAMUSCULAR | Status: AC
  Filled 2019-12-30: qty 2

## 2019-12-30 MED ORDER — OXYCODONE HCL 5 MG PO TABS: 5.0000 mg | ORAL_TABLET | ORAL | Status: DC | PRN

## 2019-12-30 MED ORDER — BUPIVACAINE-EPINEPHRINE (PF) 0.5% -1:200000 IJ SOLN
INTRAMUSCULAR | Status: DC | PRN
  Administered 2019-12-30: 15 mL via PERINEURAL

## 2019-12-30 MED ORDER — SUGAMMADEX SODIUM 200 MG/2ML IV SOLN
INTRAVENOUS | Status: DC | PRN
Start: 2019-12-30 — End: 2019-12-30
  Administered 2019-12-30: 400 mg via INTRAVENOUS

## 2019-12-30 SURGICAL SUPPLY — 82 items
ALCOHOL 70% 16 OZ (MISCELLANEOUS) ×2 IMPLANT
AUG COMP REV MI TAPER ADAPTER (Joint) ×2 IMPLANT
AUGMENT COMP REV MI TAPR ADPTR (Joint) ×1 IMPLANT
BEARING HUMERAL 40 STD VITE (Joint) ×2 IMPLANT
BIT DRILL 2.7 W/STOP DISP (BIT) ×2 IMPLANT
BIT DRILL F/CENTRAL SCRW 3.2 (BIT) ×1
BIT DRILL F/CENTRAL SCRW 3.2MM (BIT) ×1 IMPLANT
BIT DRILL TWIST 2.7 (BIT) ×2 IMPLANT
BLADE SAW SGTL 13X75X1.27 (BLADE) ×2 IMPLANT
BSPLAT GLND SM AUG TPR ADPR (Joint) ×1 IMPLANT
CHLORAPREP W/TINT 26 (MISCELLANEOUS) ×2 IMPLANT
COVER SURGICAL LIGHT HANDLE (MISCELLANEOUS) ×2 IMPLANT
COVER WAND RF STERILE (DRAPES) ×2 IMPLANT
DRAPE INCISE IOBAN 66X45 STRL (DRAPES) ×2 IMPLANT
DRAPE U-SHAPE 47X51 STRL (DRAPES) ×4 IMPLANT
DRILL BIT F/CENTRAL SCRW 3.2MM (BIT) ×2
DRSG AQUACEL AG ADV 3.5X10 (GAUZE/BANDAGES/DRESSINGS) ×2 IMPLANT
ELECT BLADE 4.0 EZ CLEAN MEGAD (MISCELLANEOUS) ×2
ELECT REM PT RETURN 9FT ADLT (ELECTROSURGICAL) ×2
ELECTRODE BLDE 4.0 EZ CLN MEGD (MISCELLANEOUS) ×1 IMPLANT
ELECTRODE REM PT RTRN 9FT ADLT (ELECTROSURGICAL) ×1 IMPLANT
GAUZE SPONGE 4X4 12PLY STRL LF (GAUZE/BANDAGES/DRESSINGS) ×2 IMPLANT
GLENOSPHERE VERSADIAL 40 +3 (Joint) ×2 IMPLANT
GLOVE BIOGEL PI IND STRL 7.0 (GLOVE) ×1 IMPLANT
GLOVE BIOGEL PI IND STRL 8 (GLOVE) ×1 IMPLANT
GLOVE BIOGEL PI INDICATOR 7.0 (GLOVE) ×1
GLOVE BIOGEL PI INDICATOR 8 (GLOVE) ×1
GLOVE ECLIPSE 7.0 STRL STRAW (GLOVE) ×2 IMPLANT
GLOVE ECLIPSE 8.0 STRL XLNG CF (GLOVE) ×2 IMPLANT
GOWN STRL REUS W/ TWL LRG LVL3 (GOWN DISPOSABLE) ×2 IMPLANT
GOWN STRL REUS W/ TWL XL LVL3 (GOWN DISPOSABLE) IMPLANT
GOWN STRL REUS W/TWL LRG LVL3 (GOWN DISPOSABLE) ×4
GOWN STRL REUS W/TWL XL LVL3 (GOWN DISPOSABLE)
GUIDE MODEL REV SHLD LT (ORTHOPEDIC DISPOSABLE SUPPLIES) ×2 IMPLANT
HYDROGEN PEROXIDE 16OZ (MISCELLANEOUS) ×2 IMPLANT
JET LAVAGE IRRISEPT WOUND (IRRIGATION / IRRIGATOR) ×2
KIT BASIN OR (CUSTOM PROCEDURE TRAY) ×2 IMPLANT
KIT TURNOVER KIT B (KITS) ×2 IMPLANT
LAVAGE JET IRRISEPT WOUND (IRRIGATION / IRRIGATOR) ×1 IMPLANT
LOOP VESSEL MAXI BLUE (MISCELLANEOUS) ×2 IMPLANT
MANIFOLD NEPTUNE II (INSTRUMENTS) ×2 IMPLANT
NDL SUT 6 .5 CRC .975X.05 MAYO (NEEDLE) IMPLANT
NEEDLE MAYO TAPER (NEEDLE)
NEEDLE TAPERED W/ NITINOL LOOP (MISCELLANEOUS) ×2 IMPLANT
NS IRRIG 1000ML POUR BTL (IV SOLUTION) ×2 IMPLANT
PACK SHOULDER (CUSTOM PROCEDURE TRAY) ×2 IMPLANT
PAD ARMBOARD 7.5X6 YLW CONV (MISCELLANEOUS) ×4 IMPLANT
PASSER SUT SWANSON 36MM LOOP (INSTRUMENTS) ×2 IMPLANT
PIN STEINMANN THREADED TIP (PIN) ×2 IMPLANT
PIN THREADED REVERSE (PIN) ×4 IMPLANT
REAMER GUIDE BUSHING SURG DISP (MISCELLANEOUS) ×2 IMPLANT
REAMER GUIDE W/SCREW AUG (MISCELLANEOUS) ×2 IMPLANT
RESTRAINT HEAD UNIVERSAL NS (MISCELLANEOUS) ×2 IMPLANT
SCREW BONE STRL 6.5MMX25MM (Screw) ×2 IMPLANT
SCREW LOCKING 4.75MMX15MM (Screw) ×4 IMPLANT
SCREW LOCKING NS 4.75MMX20MM (Screw) ×2 IMPLANT
SCREW LOCKING STRL 4.75X25X3.5 (Screw) ×2 IMPLANT
SLING ARM IMMOBILIZER LRG (SOFTGOODS) ×2 IMPLANT
SOL PREP POV-IOD 4OZ 10% (MISCELLANEOUS) ×2 IMPLANT
SPONGE LAP 18X18 RF (DISPOSABLE) ×2 IMPLANT
STEM HUMERAL STRL 13MMX55MM (Stem) ×2 IMPLANT
STRIP CLOSURE SKIN 1/2X4 (GAUZE/BANDAGES/DRESSINGS) ×2 IMPLANT
SUCTION FRAZIER HANDLE 10FR (MISCELLANEOUS) ×2
SUCTION TUBE FRAZIER 10FR DISP (MISCELLANEOUS) ×1 IMPLANT
SUT BROADBAND TAPE 2PK 1.5 (SUTURE) ×4 IMPLANT
SUT FIBERWIRE #2 38 T-5 BLUE (SUTURE)
SUT MAXBRAID (SUTURE) IMPLANT
SUT MNCRL AB 3-0 PS2 18 (SUTURE) ×2 IMPLANT
SUT SILK 2 0 TIES 10X30 (SUTURE) ×2 IMPLANT
SUT VIC AB 0 CT1 27 (SUTURE) ×8
SUT VIC AB 0 CT1 27XBRD ANBCTR (SUTURE) ×4 IMPLANT
SUT VIC AB 1 CT1 27 (SUTURE) ×4
SUT VIC AB 1 CT1 27XBRD ANBCTR (SUTURE) ×2 IMPLANT
SUT VIC AB 2-0 CT1 27 (SUTURE) ×6
SUT VIC AB 2-0 CT1 TAPERPNT 27 (SUTURE) ×3 IMPLANT
SUT VICRYL 0 UR6 27IN ABS (SUTURE) ×4 IMPLANT
SUTURE FIBERWR #2 38 T-5 BLUE (SUTURE) IMPLANT
TOWEL GREEN STERILE (TOWEL DISPOSABLE) ×2 IMPLANT
TRAY FOL W/BAG SLVR 16FR STRL (SET/KITS/TRAYS/PACK) IMPLANT
TRAY FOLEY W/BAG SLVR 16FR LF (SET/KITS/TRAYS/PACK)
TRAY HUM MINI 40 +5 +6 (Shoulder) ×2 IMPLANT
WATER STERILE IRR 1000ML POUR (IV SOLUTION) ×2 IMPLANT

## 2019-12-30 NOTE — Addendum Note (Signed)
Addendum  created 12/30/19 1807 by Belinda Block, MD   Intraprocedure Event edited, Intraprocedure Staff edited

## 2019-12-30 NOTE — Transfer of Care (Signed)
Immediate Anesthesia Transfer of Care Note  Patient: Richard King  Procedure(s) Performed: LEFT REVERSE SHOULDER ARTHROPLASTY (Left Shoulder)  Patient Location: PACU  Anesthesia Type:GA combined with regional for post-op pain  Level of Consciousness: drowsy and patient cooperative  Airway & Oxygen Therapy: Patient Spontanous Breathing and Patient connected to face mask oxygen  Post-op Assessment: Report given to RN and Post -op Vital signs reviewed and stable  Post vital signs: Reviewed and stable  Last Vitals:  Vitals Value Taken Time  BP    Temp    Pulse    Resp    SpO2      Last Pain:  Vitals:   12/30/19 1300  TempSrc:   PainSc: 0-No pain      Patients Stated Pain Goal: 3 (13/68/59 9234)  Complications: No complications documented.

## 2019-12-30 NOTE — Anesthesia Procedure Notes (Signed)
Anesthesia Regional Block: Interscalene brachial plexus block   Pre-Anesthetic Checklist: ,, timeout performed, Correct Patient, Correct Site, Correct Laterality, Correct Procedure, Correct Position, site marked, Risks and benefits discussed,  Surgical consent,  Pre-op evaluation,  At surgeon's request and post-op pain management  Laterality: Left  Prep: chloraprep       Needles:  Injection technique: Single-shot  Needle Type: Echogenic Stimulator Needle     Needle Length: 5cm  Needle Gauge: 22     Additional Needles:   Procedures:, nerve stimulator,,,,,,,   Nerve Stimulator or Paresthesia:  Response: biceps flexion, 0.45 mA,   Additional Responses:   Narrative:  Start time: 12/30/2019 12:40 PM End time: 12/30/2019 12:49 PM Injection made incrementally with aspirations every 5 mL.  Performed by: Personally  Anesthesiologist: Albertha Ghee, MD  Additional Notes: Functioning IV was confirmed and monitors were applied.  A 44mm 22ga Arrow echogenic stimulator needle was used. Sterile prep and drape,hand hygiene and sterile gloves were used.  Negative aspiration and negative test dose prior to incremental administration of local anesthetic. The patient tolerated the procedure well.  Ultrasound guidance: relevent anatomy identified, needle position confirmed, local anesthetic spread visualized around nerve(s), vascular puncture avoided.  Image printed for medical record.

## 2019-12-30 NOTE — Anesthesia Procedure Notes (Signed)
Procedure Name: Intubation Date/Time: 12/30/2019 2:14 PM Performed by: Alain Marion, CRNA Pre-anesthesia Checklist: Patient identified, Emergency Drugs available, Suction available and Patient being monitored Patient Re-evaluated:Patient Re-evaluated prior to induction Oxygen Delivery Method: Circle System Utilized Preoxygenation: Pre-oxygenation with 100% oxygen Induction Type: IV induction Ventilation: Mask ventilation without difficulty and Oral airway inserted - appropriate to patient size Laryngoscope Size: Glidescope and 4 Grade View: Grade I Tube type: Oral Tube size: 7.5 mm Number of attempts: 1 Airway Equipment and Method: Stylet,  Oral airway and Video-laryngoscopy Placement Confirmation: ETT inserted through vocal cords under direct vision,  positive ETCO2 and breath sounds checked- equal and bilateral Secured at: 22 cm Tube secured with: Tape Dental Injury: Teeth and Oropharynx as per pre-operative assessment

## 2019-12-30 NOTE — Anesthesia Postprocedure Evaluation (Signed)
Anesthesia Post Note  Patient: Richard King  Procedure(s) Performed: LEFT REVERSE SHOULDER ARTHROPLASTY (Left Shoulder)     Patient location during evaluation: PACU Anesthesia Type: General Level of consciousness: awake Pain management: pain level controlled Vital Signs Assessment: post-procedure vital signs reviewed and stable Respiratory status: spontaneous breathing Cardiovascular status: stable Postop Assessment: no apparent nausea or vomiting Anesthetic complications: no   No complications documented.  Last Vitals:  Vitals:   12/30/19 1300 12/30/19 1733  BP: (!) 169/89 (!) 130/84  Pulse: 75 78  Resp: 17 16  Temp:  (!) 36.3 C  SpO2: 96% 100%    Last Pain:  Vitals:   12/30/19 1733  TempSrc:   PainSc: 0-No pain                 Fabrizzio Marcella

## 2019-12-30 NOTE — Op Note (Signed)
NAMECAIDYN, HENRICKSEN MEDICAL RECORD NU:[id number] ACCOUNT [id number] DATE OF BIRTH:[birth year] FACILITY: [facility] LOCATION: [facility] PHYSICIAN:Taytum Scheck Randel Pigg, MD  OPERATIVE REPORT  DATE OF PROCEDURE:  12/30/2019  PREOPERATIVE DIAGNOSIS:  Left shoulder arthritis.  POSTOPERATIVE DIAGNOSIS:  Left shoulder arthritis.  PROCEDURE:  Left shoulder reverse shoulder replacement.  SURGEON:  Meredith Pel, MD  ASSISTANT:  Annie Main, PA.  INDICATIONS:  This is a patient with end-stage left shoulder arthritis who presents for operative management after explanation of risks and benefits.  IMPLANTS UTILIZED:  Biomet comprehensive reverse shoulder small augmented baseplate with 1 central compression screw, 4 fixed locking screws for the glenoid baseplate and a glenosphere 40 mm +3 mm lateral offset glenosphere with mini humeral tray, +5  thickness, +6 taper offset, 40 mm diameter with standard 40 mm bearing and micro stem 13 x 55.  PROCEDURE IN DETAIL:  The patient was brought to the operating room where general anesthetic was induced.  Preoperative antibiotics administered.  Timeout was called.  The left shoulder had been scrubbed with benzoyl peroxide for the past 3 days by the  patient.  Here in the OR, he was prescrubbed with hydrogen peroxide and then alcohol and Betadine, which was allowed to air dry, then prepped with ChloraPrep solution and draped in a sterile manner.  Ioban used to cover the entire operative field.   Timeout was called.  The patient's head was in neutral position.  A deltopectoral approach was made.  Skin and subcutaneous tissue were sharply divided.  The cephalic vein mobilized medially.  Deltoid was elevated off of the surrounding rotator cuff.   There was some attenuation of the supraspinatus.  Subscap was intact.  Manual elevation of the anterior portion of the deltoid attachment was performed.  Next, the biceps tendon was tenodesed to the pectoralis using three #1 Vicryl  sutures.  The rotator  interval was then opened up to the base of the coracoid.  The circumflex vessels were ligated with a silk ligature.  The axillary nerve was then visualized and identified.  A vessel loop was placed around it and it was protected at all times during the  case.  Next, using a 15 blade, with progressive external rotation, the subscapularis was removed, along with the capsule.  This was done around to the 7 o'clock position on the humeral head.  The coracohumeral ligament was also released to mobilize the  subscapularis.  Next, the humeral head was dislocated.  Intramedullary alignment was then used to cut the head after reaming up to a size 12.  The head was cut level with the rotator cuff attachment site into the tuberosity.  Neurovascular structures and  axillary nerve were protected.  The cut was made in approximately 30 degrees of retroversion.  The humerus was then broached up to size 12, but the size 12 stem would not seat well.  At that time, it was chosen to change from mini stem to micro stem.   The 13 stem fit very nicely and that was the one that was chosen.  Next, a cap was placed on the broach.  Attention was directed towards the glenoid.  Circumferential release of the capsule and labrum was performed, with care being taken to avoid injury  to the neurovascular structures.  The patient-specific guide was then placed on the glenoid with the humerus retracted.  Reaming was then performed.  Augment was placed posterior superior.  A very good 100% contact was achieved.  Thorough irrigation was  performed  after the incision with the IrriSept solution and then at all times  after the arthrotomy.  This was done to prevent infection.  Next, the baseplate was placed and good compression screw fixation centrally as well as peripherally with locking  screws was achieved.  At this time, based on the spacing present between the baseplate and the humeral head trial, decision was made to  go ahead and use a 40 mm +3 offset glenosphere.  This was placed in good position.  Next, attention was directed  towards the humerus.  The true stem was placed after first placing suture tapes for subscap repair.  Then, trial reduction was performed and the best stability was achieved with the +6 taper offset mini humeral tray +5 thickness with standard bearing.   This gave moderate difficulty with reduction, but excellent stability with adduction, extension and forward movement.  Also had good lateralization.  Tension on both the nerve and the conjoined tendon was also good.  Next, a thorough and thorough  irrigation was performed with the irrigating solution.  True component was placed and the same stability parameters were maintained.  At this time, irrigation with IrriSept solution was performed after irrigating with 3 L of irrigating solution.   Vancomycin powder placed within the joint and the subscapularis was then repaired with the arm externally rotated 30 degrees.  Rotator interval was then closed using #1 Vicryl suture again with the arm externally rotated.  Appropriate tension on the  rotator cuff was present.  Next, irrigation was again performed.  The vessel loop was removed from the axillary nerve, which was palpated and intact, then the deltopectoral interval was closed using #1 Vicryl suture, followed by interrupted inverted 0  Vicryl suture, 2-0 Vicryl suture and 3-0 Monocryl.  Aquacel dressing and shoulder immobilizer placed.  The patient tolerated the procedure well without immediate complications, transferred to recovery room in stable condition.  Luke's assistance was  required at all times during the case for retraction, mobilization of tissues, opening and closing.  His assistance was a medical necessity.  VN/NUANCE  D:12/30/2019 T:12/30/2019 JOB:[id number]

## 2019-12-30 NOTE — Telephone Encounter (Signed)
Will be admitted

## 2019-12-30 NOTE — H&P (Signed)
ZAELYN BARBARY is an 79 y.o. male.   Chief Complaint: Left shoulder pain HPI: Kayla is a 79 year old patient with left shoulder pain.  He has had multiple efforts at conservative management for end-stage glenohumeral arthritis.  Presents with significant pain and some limitation of function.  Presents now for shoulder replacement after explanation risk benefits.  Past Medical History:  Diagnosis Date  . Arthritis   . B12 deficiency   . Bladder cancer ([place])   . BPH (benign prostatic hypertrophy)   . ED (erectile dysfunction)   . History of kidney stones   . History of nephrolithiasis   . Hx of colonic polyps   . Hypertension   . Pneumonia    as a child    Past Surgical History:  Procedure Laterality Date  . bladder cancer shaving     hx bladder cancer  . CATARACT EXTRACTION    . COLONOSCOPY    . LITHOTRIPSY     per Dr. Risa Grill  . POLYPECTOMY  2009   [facility]  . 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 Hypertension Father   . AAA (abdominal aortic aneurysm) Father   . Stroke Sister   . Cancer Neg Hx   . Diabetes Neg Hx   . Colon cancer Neg Hx   . Prostate cancer Neg Hx    Social History:  reports that he quit smoking about 43 years ago. He has never used smokeless tobacco. He reports that he does not drink alcohol and does not use drugs.  Allergies:  Allergies  Allergen Reactions  . Ivp Dye [Iodinated Diagnostic Agents] Swelling  . Codeine     insomnia    Medications Prior to Admission  Medication Sig Dispense Refill  . doxazosin (CARDURA) 1 MG tablet Take 1-2 tablets (1-2 mg total) by mouth daily. (Patient taking differently: Take 2 mg by mouth at bedtime. ) 180 tablet 3  . sertraline (ZOLOFT) 25 MG tablet TAKE 1 TABLET BY MOUTH DAILY. (Patient taking differently: Take 25 mg by mouth at bedtime. ) 90 tablet 3  . verapamil (CALAN-SR) 120 MG CR tablet TAKE 1 TABLET BY  MOUTH AT BEDTIME. (Patient taking differently: Take 120 mg by mouth at bedtime. ) 90 tablet 3  . cyanocobalamin (,VITAMIN B-12,) 1000 MCG/ML injection 1028mcg IM every 14 days 1 mL   . traMADol (ULTRAM) 50 MG tablet Take 1 tablet (50 mg total) by mouth every 6 (six) hours as needed for moderate pain or severe pain (sedation caution). 60 tablet 1    No results found for this or any previous visit (from the past 48 hour(s)). No results found.  Review of Systems  Musculoskeletal: Positive for arthralgias.  All other systems reviewed and are negative.   Blood pressure (!) 176/89, pulse 62, temperature 98 F (36.7 C), temperature source Oral, resp. rate 14, height 5\' 11"  (1.803 m), weight (!) 105.7 kg, SpO2 97 %. Physical Exam Vitals reviewed.  HENT:     Head: Normocephalic.     Nose: Nose normal.     Mouth/Throat:     Mouth: Mucous membranes are moist.  Eyes:     Pupils: Pupils are equal, round, and reactive to light.  Cardiovascular:     Rate and Rhythm: Normal rate.     Pulses: Normal pulses.  Pulmonary:     Effort: Pulmonary effort is normal.  Abdominal:  Palpations: Abdomen is soft.  Musculoskeletal:     Cervical back: Normal range of motion.  Skin:    General: Skin is warm.     Capillary Refill: Capillary refill takes less than 2 seconds.  Neurological:     General: No focal deficit present.     Mental Status: He is alert.  Psychiatric:        Mood and Affect: Mood normal.   Examination of the left shoulder demonstrates full active and passive range of motion of the wrist and elbow.  He is got about 60 degrees of external rotation at 15 degrees of abduction.  Deltoid is functional.  Forward flexion abduction approaching 90 on the abduction front.  Slightly above 90 on the forward flexion front.  Significant coarse grinding and crepitus is present.  Assessment/Plan Impression is severe end-stage glenohumeral arthritis in a 79 year old patient.  Plan is reverse shoulder  replacement.  Risk benefits are discussed include not limited to infection nerve vessel damage instability as well as potential need for revision surgery and incomplete pain relief and incomplete functional improvement.  Patient understands risk benefits and wishes to proceed.  He may be someone who will require skilled nursing facility placement until he is fully independent at home due to lack of immediate family's ability to be with him completely on a long-term basis.Anderson Malta, MD 12/30/2019, 12:44 PM

## 2019-12-30 NOTE — Brief Op Note (Signed)
   12/30/2019  5:08 PM  PATIENT:  Carleene Mains  79 y.o. male  PRE-OPERATIVE DIAGNOSIS:  left shoulder osteoarthritis  POST-OPERATIVE DIAGNOSIS:  left shoulder osteoarthritis  PROCEDURE:  Procedure(s): LEFT REVERSE SHOULDER ARTHROPLASTY  SURGEON:  Surgeon(s): Meredith Pel, MD  ASSISTANT: magnant pa  ANESTHESIA:   general  EBL: 75 ml    Total I/O In: 1300 [I.V.:1100; IV Piggyback:200] Out: 75 [Blood:75]  BLOOD ADMINISTERED: none  DRAINS: none   LOCAL MEDICATIONS USED: vanco  SPECIMEN:  No Specimen  COUNTS:  YES  TOURNIQUET:  * No tourniquets in log *  DICTATION: .Other Dictation: Dictation Number [id number]  PLAN OF CARE: Admit to inpatient   PATIENT DISPOSITION:  PACU - hemodynamically stable

## 2019-12-31 ENCOUNTER — Encounter (HOSPITAL_COMMUNITY): Payer: Self-pay | Admitting: Orthopedic Surgery

## 2019-12-31 MED ORDER — TRAMADOL HCL 50 MG PO TABS
100.0000 mg | ORAL_TABLET | Freq: Four times a day (QID) | ORAL | Status: DC | PRN
  Administered 2019-12-31 – 2020-01-01 (×2): 50 mg via ORAL
  Administered 2020-01-01: 100 mg via ORAL
  Administered 2020-01-01 (×2): 50 mg via ORAL
  Administered 2020-01-01 – 2020-01-02 (×3): 100 mg via ORAL
  Administered 2020-01-02: 50 mg via ORAL
  Administered 2020-01-03 – 2020-01-04 (×4): 100 mg via ORAL
  Administered 2020-01-05: 50 mg via ORAL
  Filled 2019-12-31 (×15): qty 2

## 2019-12-31 MED ORDER — DIPHENHYDRAMINE HCL 25 MG PO CAPS
25.0000 mg | ORAL_CAPSULE | Freq: Four times a day (QID) | ORAL | Status: DC | PRN
  Administered 2020-01-01 (×2): 25 mg via ORAL
  Filled 2019-12-31 (×2): qty 1

## 2019-12-31 NOTE — Plan of Care (Signed)
  Problem: Education: Goal: Knowledge of General Education information will improve Description: Including pain rating scale, medication(s)/side effects and non-pharmacologic comfort measures Outcome: Progressing   Problem: Health Behavior/Discharge Planning: Goal: Ability to manage health-related needs will improve Outcome: Progressing   Problem: Clinical Measurements: Goal: Ability to maintain clinical measurements within normal limits will improve Outcome: Progressing Goal: Will remain free from infection Outcome: Progressing Goal: Diagnostic test results will improve Outcome: Progressing Goal: Respiratory complications will improve Outcome: Progressing Goal: Cardiovascular complication will be avoided Outcome: Progressing   Problem: Clinical Measurements: Goal: Will remain free from infection Outcome: Progressing   Problem: Clinical Measurements: Goal: Cardiovascular complication will be avoided Outcome: Progressing

## 2019-12-31 NOTE — Plan of Care (Signed)

## 2019-12-31 NOTE — Evaluation (Signed)
Physical Therapy Evaluation Patient Details Name: Richard King MRN: [id number] DOB: April 18, 1941 Today's Date: 12/31/2019   History of Present Illness  Pt is a 79 y.o. male s/p elective L reverse TSA on 12/30/19. PMH includes HTN, bladder CA.  Clinical Impression  Pt presents with an overall decrease in functional mobility secondary to above. PTA, pt independent, lives alones. Educ on precautions, positioning, therex, and importance of mobility. Today, pt able to initiate transfer and gait training, requiring assist to prevent LOB walking with [facility]; requires increased assist for ADL tasks; at high risk for falls. Pt reports he does not feel he can safely manage at home alone and does not have family to assist him during the day. Pt would benefit from SNF-level therapies to maximize functional mobility and independence prior to return home. Will follow acutely to address established goals.    Follow Up Recommendations SNF;Supervision for mobility/OOB    Equipment Recommendations   (defer)    Recommendations for Other Services       Precautions / Restrictions Precautions Precautions: Shoulder;Fall Type of Shoulder Precautions: AROM elbow wrist and hand to tolerance, ok for pendulums, no AROM shoulder Shoulder Interventions: Shoulder sling/immobilizer;At all times;Off for dressing/bathing/exercises Precaution Booklet Issued: Yes (comment) Precaution Comments: provided handout and verbally reviewed Required Braces or Orthoses: Sling Restrictions Weight Bearing Restrictions: Yes LUE Weight Bearing: Non weight bearing      Mobility  Bed Mobility     General bed mobility comments: Received sitting in recliner  Transfers Overall transfer level: Needs assistance Equipment used: Straight cane Transfers: Sit to/from Stand Sit to Stand: Min guard         General transfer comment: minA for stability with standing  Ambulation/Gait Ambulation/Gait assistance: Min guard;Min  assist Gait Distance (Feet): 80 Feet Assistive device: Straight cane Gait Pattern/deviations: Step-through pattern;Decreased stride length;Staggering left;Trunk flexed Gait velocity: Decreased   General Gait Details: Slow, mildy unsteady gait with SPC and close min guard; intermittent minA to prevent LOB, especially with turns and head turns  Financial trader Rankin (Stroke Patients Only)       Balance Overall balance assessment: Needs assistance Sitting-balance support: No upper extremity supported;Feet supported Sitting balance-Leahy Scale: Fair     Standing balance support: Single extremity supported;During functional activity;No upper extremity supported Standing balance-Leahy Scale: Fair Standing balance comment: pt able to static stand unsupported but demonstrated preference for single UE support                             Pertinent Vitals/Pain Pain Assessment: No/denies pain    Home Living Family/patient expects to be discharged to:: Private residence Living Arrangements: Alone   Type of Home: House Home Access: Stairs to enter Entrance Stairs-Rails: None (reports he can hold onto porch) Technical brewer of Steps: 5 Home Layout: One level Home Equipment: Cane - single point;Walker - 2 wheels Additional Comments: admitted wife to long term care facility due to dementia    Prior Function Level of Independence: Independent         Comments: pt was independent with all mobility and ADL; pt reports he does not feel he can manage at home yet. 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   Dominant Hand: Right    Extremity/Trunk Assessment   Upper Extremity Assessment Upper Extremity Assessment: LUE deficits/detail LUE Deficits / Details:  decreased assessment due to pt report of active block;pt able to move digits, unable to make a tight fist, able to complete wrist flexion and extension,  no elbow ROM LUE Sensation: decreased light touch LUE Coordination: decreased fine motor;decreased gross motor    Lower Extremity Assessment Lower Extremity Assessment: Overall WFL for tasks assessed    Cervical / Trunk Assessment Cervical / Trunk Assessment: Normal  Communication   Communication: HOH  Cognition Arousal/Alertness: Awake/alert Behavior During Therapy: WFL for tasks assessed/performed Overall Cognitive Status: Within Functional Limits for tasks assessed                                 General Comments: pt reports he does not feel safe to d/c home yet; pt mildly impulsive at times      General Comments General comments (skin integrity, edema, etc.): SpO2 down to 88% on RA with activity    Exercises General Exercises - Lower Extremity Long Arc Quad: AROM;Both;Seated Straight Leg Raises: AROM;Both;Seated Hip Flexion/Marching: AROM;Both;Seated   Assessment/Plan    PT Assessment Patient needs continued PT services  PT Problem List Decreased strength;Decreased range of motion;Decreased activity tolerance;Decreased balance;Decreased mobility;Decreased knowledge of use of DME;Decreased knowledge of precautions       PT Treatment Interventions DME instruction;Gait training;Stair training;Functional mobility training;Therapeutic activities;Therapeutic exercise;Balance training;Patient/family education    PT Goals (Current goals can be found in the Care Plan section)  Acute Rehab PT Goals Patient Stated Goal: to go to SNF prior to returning home PT Goal Formulation: With patient Time For Goal Achievement: 01/14/20 Potential to Achieve Goals: Good    Frequency Min 3X/week   Barriers to discharge Decreased caregiver support      Co-evaluation               AM-PAC PT "6 Clicks" Mobility  Outcome Measure Help needed turning from your back to your side while in a flat bed without using bedrails?: A Little Help needed moving from lying on  your back to sitting on the side of a flat bed without using bedrails?: A Little Help needed moving to and from a bed to a chair (including a wheelchair)?: A Little Help needed standing up from a chair using your arms (e.g., wheelchair or bedside chair)?: A Little Help needed to walk in hospital room?: A Little Help needed climbing 3-5 steps with a railing? : A Lot 6 Click Score: 17    End of Session Equipment Utilized During Treatment: Gait belt;Other (comment) (LUE sling) Activity Tolerance: Patient tolerated treatment well Patient left: in chair;with call bell/phone within reach;with chair alarm set Nurse Communication: Mobility status PT Visit Diagnosis: Other abnormalities of gait and mobility (R26.89);Difficulty in walking, not elsewhere classified (R26.2)    Time: 3382-5053 PT Time Calculation (min) (ACUTE ONLY): 18 min   Charges:   PT Evaluation $PT Eval Low Complexity: [place], PT, DPT Acute Rehabilitation Services  Pager [phone] Office [phone]  Derry Lory 12/31/2019, 12:40 PM

## 2019-12-31 NOTE — Evaluation (Signed)
Occupational Therapy Evaluation Patient Details Name: IASIAH OZMENT MRN: [id number] DOB: Nov 19, 1940 Today's Date: 12/31/2019    History of Present Illness Dorene Grebe. Westberg is a 79 y.o. male s/p left reverse shoulder arthroplasty. PMH includes but not limited to bladder cancer and HTN.    Clinical Impression   PTA, pt was living at home alone, he admitted his wife to long term care facility last week due to her advanced dementia. Pt was independent with ADL/IADL and was independent with functional mobility. Pt currently requires modA for UB dressing, modA for LB dressing and minA for toilet transfers. Pt reports he does not feel he can safely manage at home alone and does not have family to assist him during the day. Due to decline in current level of function, pt would benefit from acute OT to address established goals to facilitate safe D/C to venue listed below. At this time, recommend SNF follow-up. Will continue to follow acutely.  Pt on RA throughout session SpO2 88%-93%.     Follow Up Recommendations  SNF    Equipment Recommendations   (TBD at next venue)    Recommendations for Other Services       Precautions / Restrictions Precautions Precautions: Shoulder Type of Shoulder Precautions: AROM elbow wrist and hand to tolerance, ok for pendulums, no AROM shoulder Shoulder Interventions: Shoulder sling/immobilizer;At all times;Off for dressing/bathing/exercises Precaution Booklet Issued: Yes (comment) Precaution Comments: provided handout and verbally reviewed Required Braces or Orthoses: Sling Restrictions Weight Bearing Restrictions: Yes LUE Weight Bearing: Non weight bearing      Mobility Bed Mobility Overal bed mobility: Needs Assistance Bed Mobility: Supine to Sit     Supine to sit: Min guard;HOB elevated     General bed mobility comments: HOB elevated minguard for safety  Transfers Overall transfer level: Needs assistance Equipment used: 1 person hand held  assist Transfers: Sit to/from Stand Sit to Stand: Min assist         General transfer comment: minA for stability with standing    Balance Overall balance assessment: Needs assistance Sitting-balance support: No upper extremity supported;Feet supported Sitting balance-Leahy Scale: Fair     Standing balance support: Single extremity supported;During functional activity Standing balance-Leahy Scale: Fair Standing balance comment: pt able to static stand unsupported but demonstrated preference for single UE support                           ADL either performed or assessed with clinical judgement   ADL Overall ADL's : Needs assistance/impaired Eating/Feeding: Set up;Sitting Eating/Feeding Details (indicate cue type and reason): assistance opening containers Grooming: Set up;Sitting   Upper Body Bathing: Min guard;Sitting   Lower Body Bathing: Minimal assistance;Sit to/from stand Lower Body Bathing Details (indicate cue type and reason): minA for stability Upper Body Dressing : Moderate assistance;Sitting Upper Body Dressing Details (indicate cue type and reason): moderate assistance for donning/doffing sling and UB clothing; Lower Body Dressing: Moderate assistance;Sit to/from stand Lower Body Dressing Details (indicate cue type and reason): able to adjust socks sitting EOB, decreased range to access feet increased DoE 3/4 with adjustment Toilet Transfer: Minimal assistance;Ambulation Toilet Transfer Details (indicate cue type and reason): pt urinated standing at commode, minA for safety and stability with turning Toileting- Clothing Manipulation and Hygiene: Minimal assistance       Functional mobility during ADLs: Minimal assistance General ADL Comments: educated pt on compensatory strategies for dressing, educated pt on proper positioning of LUE, proper sling  wear schedule and proper positioning of LUE in sling;pt mildly unsteady with functional mobility  especially turning, requiring minA for stability;pt reports vertigo     Vision         Perception     Praxis      Pertinent Vitals/Pain Pain Assessment: No/denies pain     Hand Dominance Right   Extremity/Trunk Assessment Upper Extremity Assessment Upper Extremity Assessment: LUE deficits/detail LUE Deficits / Details: decreased assessment due to pt report of active block;pt able to move digits, unable to make a tight fist, able to complete wrist flexion and extension, no elbow ROM LUE Sensation: decreased light touch LUE Coordination: decreased fine motor;decreased gross motor   Lower Extremity Assessment Lower Extremity Assessment: Overall WFL for tasks assessed   Cervical / Trunk Assessment Cervical / Trunk Assessment: Normal   Communication Communication Communication: HOH   Cognition Arousal/Alertness: Awake/alert Behavior During Therapy: WFL for tasks assessed/performed Overall Cognitive Status: Within Functional Limits for tasks assessed                                 General Comments: pt reports he does not feel safe to d/c home yet;pt mildly impulsive at times   General Comments  surgical dressing intact;SpO2 88%-93% RA    Exercises     Shoulder Instructions      Home Living Family/patient expects to be discharged to:: Private residence Living Arrangements: Alone   Type of Home: House Home Access: Stairs to enter [employer] of Steps: 5 Entrance Stairs-Rails: None (reports he can hold onto the porch) Home Layout: One level     Bathroom Shower/Tub: Teacher, early years/pre: Standard     Home Equipment: Cane - single point;Walker - 2 wheels   Additional Comments: admitted wife to long term care facility due to dementia      Prior Functioning/Environment Level of Independence: Independent        Comments: pt was independent with all ADL;pt reports he does not feel he can manage at home yet        OT  Problem List: Decreased strength;Decreased range of motion;Decreased activity tolerance;Impaired balance (sitting and/or standing);Decreased knowledge of use of DME or AE;Decreased knowledge of precautions;Cardiopulmonary status limiting activity;Obesity;Impaired UE functional use;Pain      OT Treatment/Interventions: Self-care/ADL training;Therapeutic exercise;DME and/or AE instruction;Energy conservation;Therapeutic activities;Patient/family education;Balance training    OT Goals(Current goals can be found in the care plan section) Acute Rehab OT Goals Patient Stated Goal: to go to SNF prior to returning home OT Goal Formulation: With patient Time For Goal Achievement: 01/14/20 Potential to Achieve Goals: Good ADL Goals Pt Will Perform Grooming: with modified independence;standing Pt Will Perform Upper Body Dressing: with modified independence;sitting Pt Will Perform Lower Body Dressing: with modified independence;sit to/from stand Pt/caregiver will Perform Home Exercise Program: Increased ROM;Increased strength;Left upper extremity;With written HEP provided;Independently  OT Frequency: Min 2X/week   Barriers to D/C: Decreased caregiver support  pt lives alone       Co-evaluation              AM-PAC OT "6 Clicks" Daily Activity     Outcome Measure Help from another person eating meals?: A Little Help from another person taking care of personal grooming?: A Little Help from another person toileting, which includes using toliet, bedpan, or urinal?: A Little Help from another person bathing (including washing, rinsing, drying)?: A Little Help from another person  to put on and taking off regular upper body clothing?: A Little Help from another person to put on and taking off regular lower body clothing?: A Little 6 Click Score: 18   End of Session Equipment Utilized During Treatment:  (sling) Nurse Communication: Mobility status  Activity Tolerance: Patient tolerated treatment  well Patient left: in chair;with call bell/phone within reach;with chair alarm set  OT Visit Diagnosis: Unsteadiness on feet (R26.81);History of falling (Z91.81);Pain Pain - Right/Left: Left Pain - part of body: Shoulder                Time: 0475-3391 OT Time Calculation (min): 17 min Charges:  OT General Charges $OT Visit: 1 Visit OT Evaluation $OT Eval Moderate Complexity: [place] OTR/L Acute Rehabilitation Services Office: [place] 12/31/2019, 10:53 AM

## 2019-12-31 NOTE — Progress Notes (Signed)
Patient is a 79 year old male who is postop day 1 s/p left reverse shoulder arthroplasty.  He is doing well overall.  Pain is controlled.  Block is still in effect.  His motor function of his hand has returned.  Has not had to take any pain medicine aside from Tylenol since surgery.  Arm in sling.  Dressing intact without significant drainage.  Finger abduction, EPL, wrist extension, grip strength intact.  2+ radial pulse.  Plan for discharge to skilled nursing facility.  Control pain with tramadol, per patient's recommendation with his allergy to codeine.  Follow-up with Dr. Marlou Sa 2 weeks after procedure.

## 2019-12-31 NOTE — Plan of Care (Signed)

## 2020-01-01 DIAGNOSIS — M19012 Primary osteoarthritis, left shoulder: Secondary | ICD-10-CM | POA: Diagnosis not present

## 2020-01-01 MED ORDER — ASPIRIN 81 MG PO TBEC: 81.0000 mg | DELAYED_RELEASE_TABLET | Freq: Every day | ORAL | 11 refills | Status: DC

## 2020-01-01 MED ORDER — METHOCARBAMOL 500 MG PO TABS: 500.0000 mg | ORAL_TABLET | Freq: Four times a day (QID) | ORAL | 0 refills | Status: DC | PRN

## 2020-01-01 MED ORDER — MUPIROCIN 2 % EX OINT
TOPICAL_OINTMENT | Freq: Two times a day (BID) | CUTANEOUS | Status: DC
  Administered 2020-01-05: 1 via NASAL
  Filled 2020-01-01 (×5): qty 22

## 2020-01-01 MED ORDER — TRAMADOL HCL 50 MG PO TABS: 100.0000 mg | ORAL_TABLET | Freq: Four times a day (QID) | ORAL | 0 refills | Status: DC | PRN

## 2020-01-01 NOTE — Discharge Summary (Signed)
Physician Discharge Summary      Patient ID: Richard King MRN: [id number] DOB/AGE: [birth year] 79 y.o.  Admit date: 12/30/2019 Discharge date: 01/01/2020  Admission Diagnoses:  Active Problems:   S/P reverse total shoulder arthroplasty, left   Discharge Diagnoses:  Same  Surgeries: Procedure(s): LEFT REVERSE SHOULDER ARTHROPLASTY on 12/30/2019   Consultants:   Discharged Condition: Stable  Hospital Course: Richard King is an 79 y.o. 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  They were brought to the operating room on 12/30/2019 and underwent the above named procedures.   Patient tolerated the procedure well.  He was mobilized with physical therapy who recommended skilled nursing home placement in order to maximize independence prior to returning home where he has only a minimal degree of family support.  Shoulder was mobilized well with passive range of motion by Occupational Therapy.  He is discharged in good condition and will follow up in 14 days for clinical recheck.  Passive range of motion only with no lifting with that left shoulder until he begins outpatient physical therapy in 2 weeks.  Antibiotics given:  Anti-infectives (From admission, onward)   Start     Dose/Rate Route Frequency Ordered Stop   12/30/19 2000  ceFAZolin (ANCEF) IVPB 2g/100 mL premix        2 g 200 mL/hr over 30 Minutes Intravenous Every 6 hours 12/30/19 1819 12/31/19 0924   12/30/19 1200  ceFAZolin (ANCEF) IVPB 2g/100 mL premix        2 g 200 mL/hr over 30 Minutes Intravenous On call to O.R. 12/30/19 1021 12/30/19 1447    .  Recent vital signs:  Vitals:   12/31/19 2032 01/01/20 0442  BP: 121/73 (!) 132/76  Pulse: 80 81  Resp: 16 16  Temp: 98.2 F (36.8 C) (!) 97.5 F (36.4 C)  SpO2: 93% 92%    Recent laboratory studies:  Results for orders placed or performed during the hospital encounter of  12/27/19  SARS CORONAVIRUS 2 (TAT 6-24 HRS) Nasopharyngeal Nasopharyngeal Swab   Specimen: Nasopharyngeal Swab  Result Value Ref Range   SARS Coronavirus 2 NEGATIVE NEGATIVE    Discharge Medications:   Allergies as of 01/01/2020      Reactions   Ivp Dye [iodinated Diagnostic Agents] Swelling   Codeine    insomnia      Medication List    TAKE these medications   aspirin 81 MG EC tablet Take 1 tablet (81 mg total) by mouth daily. Swallow whole.   cyanocobalamin 1000 MCG/ML injection Commonly known as: (VITAMIN B-12) 1086mcg IM every 14 days   doxazosin 1 MG tablet Commonly known as: CARDURA Take 1-2 tablets (1-2 mg total) by mouth daily. What changed:   how much to take  when to take this   methocarbamol 500 MG tablet Commonly known as: ROBAXIN Take 1 tablet (500 mg total) by mouth every 6 (six) hours as needed for muscle spasms.   sertraline 25 MG tablet Commonly known as: ZOLOFT TAKE 1 TABLET BY MOUTH DAILY. What changed: when to take this   traMADol 50 MG tablet Commonly known as: ULTRAM Take 2 tablets (100 mg total) by mouth every 6 (six) hours as needed for moderate pain. What changed:   how much to take  reasons to take this   verapamil 120 MG CR tablet Commonly known as: CALAN-SR TAKE 1 TABLET BY MOUTH AT BEDTIME.  Diagnostic Studies: DG Shoulder Left Port  Result Date: 12/30/2019 CLINICAL DATA:  Status post shoulder surgery. EXAM: LEFT SHOULDER COMPARISON:  Preoperative shoulder CT and radiograph. FINDINGS: Reverse left shoulder arthroplasty. Glenoid and humeral components are congruent. Glenoid component appears inferiorly situated, possibly related to positioning. Recent postsurgical change includes air and edema in the joint space. IMPRESSION: 1. Recent reverse left shoulder arthroplasty. 2. Glenoid component appears inferiorly situated, possibly related to positioning. Electronically Signed   By: Keith Rake M.D.   On: 12/30/2019 18:28      Disposition: Discharge disposition: 03-Skilled Nursing Facility       Discharge Instructions    Call MD / Call 911   Complete by: As directed    If you experience chest pain or shortness of breath, CALL 911 and be transported to the hospital emergency room.  If you develope a fever above 101 F, pus (white drainage) or increased drainage or redness at the wound, or calf pain, call your surgeon's office.   Constipation Prevention   Complete by: As directed    Drink plenty of fluids.  Prune juice may be helpful.  You may use a stool softener, such as Colace (over the counter) 100 mg twice a day.  Use MiraLax (over the counter) for constipation as needed.   Diet - low sodium heart healthy   Complete by: As directed    Increase activity slowly as tolerated   Complete by: As directed          Signed: Anderson Malta 01/01/2020, 8:39 AM

## 2020-01-01 NOTE — NC FL2 (Signed)
[place] LEVEL OF CARE SCREENING TOOL     IDENTIFICATION  Patient Name: Richard King Birthdate: [birth year] Sex: male Admission Date (Current Location): 12/30/2019  [facility] and [place] Number:  Herbalist and Address:  [facility], [place] [address] [place], [place] [address]      Provider Number: [id number]  Attending Physician Name and Address:  Meredith Pel, MD  Relative Name and Phone Number:  Rodena Piety daughter, [phone]    Current Level of Care: Hospital Recommended Level of Care: [place] Prior Approval Number:    Date Approved/Denied:   PASRR Number: [id number]  Discharge Plan: SNF    Current Diagnoses: Patient Active Problem List   Diagnosis Date Noted  . 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 Primary osteoarthritis, left shoulder 10/12/2019  . Rash 03/31/2019  . Lower urinary tract symptoms (LUTS) 08/27/2018  . BPPV (benign paroxysmal positional vertigo), right 12/24/2017  . B12 deficiency   . Ataxic gait 10/12/2017  . Caregiver role strain 11/13/2015  . 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 Knee pain 05/18/2015  . Nasal congestion 05/18/2015  . Skin lesion 03/30/2015  . Advance care planning 11/05/2014  . 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 Lower abdominal pain 09/04/2014  . ED (erectile dysfunction) 11/06/2011  . Vertigo 05/17/2011  . Medicare annual wellness visit, subsequent 05/10/2011  . Bladder cancer ([place])   . Essential hypertension 09/07/2008  . BPH (benign prostatic hyperplasia) 09/07/2008  . COLONIC POLYPS, HX OF 09/07/2008  . NEPHROLITHIASIS, HX OF 09/07/2008    Orientation RESPIRATION BLADDER Height & Weight     Self, Time, Situation, Place  Normal Continent Weight: (!) 233 lb (105.7 kg) Height:  5\' 11"  (180.3 cm)  BEHAVIORAL SYMPTOMS/MOOD NEUROLOGICAL BOWEL NUTRITION STATUS      Continent Diet (Please see DC Summary)  AMBULATORY STATUS  COMMUNICATION OF NEEDS Skin   Limited Assist Verbally Surgical wounds (Closed incision on shoulder)                       Personal Care Assistance Level of Assistance  Bathing, Feeding, Dressing Bathing Assistance: Limited assistance Feeding assistance: Independent Dressing Assistance: Limited assistance     Functional Limitations Info  Sight, Hearing, Speech Sight Info: Adequate Hearing Info: Adequate Speech Info: Adequate    SPECIAL CARE FACTORS FREQUENCY  PT (By licensed PT), OT (By licensed OT)     PT Frequency: 5x/week OT Frequency: 4x/week            Contractures Contractures Info: Not present    Additional Factors Info  Code Status, Allergies, Psychotropic Code Status Info: Full Allergies Info: Ivp Dye (Iodinated Diagnostic Agents), Codeine Psychotropic Info: Zoloft         Current Medications (01/01/2020):  This is the current hospital active medication list Current Facility-Administered Medications  Medication Dose Route Frequency Provider Last Rate Last Admin  . aspirin EC tablet 81 mg  81 mg Oral Daily Magnant, Charles L, PA-C   81 mg at 12/31/19 7026  . diphenhydrAMINE (BENADRYL) capsule 25 mg  25 mg Oral Q6H PRN Jessy Oto, MD   25 mg at 01/01/20 0044  . docusate sodium (COLACE) capsule 100 mg  100 mg Oral BID Magnant, Charles L, PA-C   100 mg at 12/31/19 2147  . doxazosin (CARDURA) tablet 2 mg  2 mg Oral QHS Magnant, Charles L, PA-C   2 mg at 12/31/19 2148  .  lactated ringers infusion   Intravenous Continuous Donella Stade, PA-C 75 mL/hr at 12/31/19 0446 New Bag at 12/31/19 0446  . menthol-cetylpyridinium (CEPACOL) lozenge 3 mg  1 lozenge Oral PRN Magnant, Charles L, PA-C       Or  . phenol (CHLORASEPTIC) mouth spray 1 spray  1 spray Mouth/Throat PRN Magnant, Charles L, PA-C      . methocarbamol (ROBAXIN) tablet 500 mg  500 mg Oral Q6H PRN Magnant, Charles L, PA-C   500 mg at 12/30/19 2200   Or  . methocarbamol (ROBAXIN) 500 mg in  dextrose 5 % 50 mL IVPB  500 mg Intravenous Q6H PRN Magnant, Charles L, PA-C      . metoCLOPramide (REGLAN) tablet 5-10 mg  5-10 mg Oral Q8H PRN Magnant, Charles L, PA-C       Or  . metoCLOPramide (REGLAN) injection 5-10 mg  5-10 mg Intravenous Q8H PRN Magnant, Charles L, PA-C      . mupirocin ointment (BACTROBAN) 2 %   Nasal BID Meredith Pel, MD      . ondansetron [facility]) tablet 4 mg  4 mg Oral Q6H PRN Magnant, Charles L, PA-C       Or  . ondansetron (ZOFRAN) injection 4 mg  4 mg Intravenous Q6H PRN Magnant, Charles L, PA-C      . sertraline (ZOLOFT) tablet 25 mg  25 mg Oral QHS Magnant, Charles L, PA-C   25 mg at 12/31/19 2147  . traMADol (ULTRAM) tablet 100 mg  100 mg Oral Q6H PRN Magnant, Charles L, PA-C   100 mg at 01/01/20 0730  . verapamil (CALAN-SR) CR tablet 120 mg  120 mg Oral QHS Magnant, Charles L, PA-C   120 mg at 12/31/19 2147     Discharge Medications: Please see discharge summary for a list of discharge medications.  Relevant Imaging Results:  Relevant Lab Results:   Additional Information SSn: [id number]           Has not received COVID vaccines.  Benard Halsted, LCSW

## 2020-01-01 NOTE — TOC Initial Note (Addendum)
Transition of Care [facility]) - Initial/Assessment Note    Patient Details  Name: Richard King MRN: [id number] Date of Birth: 06-09-1940  Transition of Care [facility]) CM/SW Contact:    Benard Halsted, LCSW Phone Number: 01/01/2020, 8:57 AM  Clinical Narrative:                 8:57am-CSW received consult for possible SNF placement at time of discharge. CSW spoke with patient regarding PT recommendation of SNF placement at time of discharge. Patient reported that patient's spouse is currently unable to care for patient at their home given patient's current physical needs and fall risk. Patient expressed understanding of PT recommendation and is agreeable to SNF placement at time of discharge. Patient reports preference for 1-[facility] 2-[facility] 3-[facility]. CSW discussed insurance authorization process and provided Medicare SNF ratings list. Patient has not received COVID vaccines and is aware he will go into 14 day isolation at SNF. Patient expressed being hopeful for rehab and to feel better soon. No further questions reported at this time. CSW to continue to follow and assist with discharge planning needs.  9:19am-[facility] able to accept patient on Monday pending insurance approval. Faxed clinicals to [facility] for review. He will need an updated COVID test; requested from RN.   Expected Discharge Plan: Skilled Nursing Facility Barriers to Discharge: SNF Pending bed offer, Insurance Authorization   Patient Goals and CMS Choice Patient states their goals for this hospitalization and ongoing recovery are:: Rehab CMS Medicare.gov Compare Post Acute Care list provided to:: Patient Choice offered to / list presented to : Patient  Expected Discharge Plan and Services Expected Discharge Plan: [place] In-house Referral: Clinical Social Work   Post Acute Care Choice: [place] Living arrangements for the past 2 months: [place] Expected Discharge Date:  01/02/20                                    Prior Living Arrangements/Services Living arrangements for the past 2 months: Single Family Home Lives with:: Spouse Patient language and need for interpreter reviewed:: Yes Do you feel safe going back to the place where you live?: Yes      Need for Family Participation in Patient Care: No (Comment) Care giver support system in place?: Yes (comment)   Criminal Activity/Legal Involvement Pertinent to Current Situation/Hospitalization: No - Comment as needed  Activities of Daily Living Home Assistive Devices/Equipment: Cane (specify quad or straight), Walker (specify type), Grab bars in shower, Shower chair with back ADL Screening (condition at time of admission) Patient's cognitive ability adequate to safely complete daily activities?: Yes Is the patient deaf or have difficulty hearing?: Yes Does the patient have difficulty seeing, even when wearing glasses/contacts?: No Does the patient have difficulty concentrating, remembering, or making decisions?: No Patient able to express need for assistance with ADLs?: Yes Does the patient have difficulty dressing or bathing?: No Independently performs ADLs?: Yes (appropriate for developmental age) Does the patient have difficulty walking or climbing stairs?: No Weakness of Legs: None Weakness of Arms/Hands: Left  Permission Sought/Granted Permission sought to share information with : Facility Art therapist granted to share information with : Yes, Verbal Permission Granted     Permission granted to share info w AGENCY: SNFs        Emotional Assessment Appearance:: Appears stated age Attitude/Demeanor/Rapport: Engaged Affect (typically observed): Accepting, Appropriate Orientation: : Oriented  to Self, Oriented to Place, Oriented to  Time, Oriented to Situation Alcohol / Substance Use: Not Applicable Psych Involvement: No (comment)  Admission diagnosis:  S/P  reverse total shoulder arthroplasty, left [V61.537] Patient Active Problem List   Diagnosis Date Noted  . 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 Primary osteoarthritis, left shoulder 10/12/2019  . Rash 03/31/2019  . Lower urinary tract symptoms (LUTS) 08/27/2018  . BPPV (benign paroxysmal positional vertigo), right 12/24/2017  . B12 deficiency   . Ataxic gait 10/12/2017  . Caregiver role strain 11/13/2015  . 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 Knee pain 05/18/2015  . Nasal congestion 05/18/2015  . Skin lesion 03/30/2015  . Advance care planning 11/05/2014  . 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 Lower abdominal pain 09/04/2014  . ED (erectile dysfunction) 11/06/2011  . Vertigo 05/17/2011  . Medicare annual wellness visit, subsequent 05/10/2011  . Bladder cancer ([place])   . Essential hypertension 09/07/2008  . BPH (benign prostatic hyperplasia) 09/07/2008  . COLONIC POLYPS, HX OF 09/07/2008  . NEPHROLITHIASIS, HX OF 09/07/2008   PCP:  Tonia Ghent, MD Pharmacy:   [facility], [address] [place] [address] Phone: [phone] Fax: [phone]  [place], [address] [place] [address] Phone: [phone] Fax: [phone]     Social Determinants of Health (SDOH) Interventions    Readmission Risk Interventions No flowsheet data found.

## 2020-01-01 NOTE — Plan of Care (Signed)

## 2020-01-01 NOTE — Progress Notes (Addendum)
Patient stable. Describes left shoulder pain Tolerating OT reasonably well Plan at this time is for skilled nursing placement which at this time looks to be occurring most likely on Monday Continue with occupational therapy for passive shoulder range of motion

## 2020-01-02 LAB — SARS CORONAVIRUS 2 (TAT 6-24 HRS): SARS Coronavirus 2: NEGATIVE

## 2020-01-02 MED ORDER — TAMSULOSIN HCL 0.4 MG PO CAPS: 0.4000 mg | ORAL_CAPSULE | Freq: Every day | ORAL | 0 refills | Status: DC

## 2020-01-02 MED ORDER — SENNOSIDES-DOCUSATE SODIUM 8.6-50 MG PO TABS
1.0000 | ORAL_TABLET | Freq: Two times a day (BID) | ORAL | Status: DC
  Administered 2020-01-02 – 2020-01-06 (×10): 1 via ORAL
  Filled 2020-01-02 (×10): qty 1

## 2020-01-02 MED ORDER — TAMSULOSIN HCL 0.4 MG PO CAPS: 0.4000 mg | ORAL_CAPSULE | Freq: Every day | ORAL | Status: DC

## 2020-01-02 NOTE — TOC Progression Note (Signed)
Transition of Care [facility]) - Progression Note    Patient Details  Name: Richard King MRN: [id number] Date of Birth: 04-29-1941  Transition of Care [facility]) CM/SW [place], [employer] Phone Number: 01/02/2020, 3:41 PM  Clinical Narrative:    CSW notify physician and RN that covid testing would be needed today for possible disposition on tomorrow.  TOC Team will continue to assist in disposition planning.   Expected Discharge Plan: Skilled Nursing Facility Barriers to Discharge: SNF Pending bed offer, Insurance Authorization  Expected Discharge Plan and Services Expected Discharge Plan: [place] In-house Referral: Clinical Social Work   Post Acute Care Choice: [place] Living arrangements for the past 2 months: Single Family Home Expected Discharge Date: 01/02/20                                     Social Determinants of Health (SDOH) Interventions    Readmission Risk Interventions No flowsheet data found.

## 2020-01-02 NOTE — Plan of Care (Signed)
  Problem: Education: Goal: Knowledge of General Education information will improve Description: Including pain rating scale, medication(s)/side effects and non-pharmacologic comfort measures Outcome: Progressing   Problem: Health Behavior/Discharge Planning: Goal: Ability to manage health-related needs will improve Outcome: Progressing   Problem: Activity: Goal: Risk for activity intolerance will decrease Outcome: Progressing   Problem: Nutrition: Goal: Adequate nutrition will be maintained Outcome: Progressing   Problem: Elimination: Goal: Will not experience complications related to bowel motility Outcome: Progressing Goal: Will not experience complications related to urinary retention Outcome: Progressing   Problem: Pain Managment: Goal: General experience of comfort will improve Outcome: Progressing   

## 2020-01-02 NOTE — Progress Notes (Signed)
     Subjective: 3 Days Post-Op Procedure(s) (LRB): LEFT REVERSE SHOULDER ARTHROPLASTY (Left) Awake, alert, minimal confusion. He lives at home with wife with dementia and is the primary care giver. Wife was place in SNF so as to allow for his left shoulder surgery.  No assistance at home. This morning reported that he is not able to void. Has history of bladder cancer treated by Dr. Karsten Ro. He reports that he has been on flomax and that he has had difficulty in the past. Only taking tramadol 100 mg  For pain.  Patient reports pain as mild.    Objective:   VITALS:  Temp:  [97.8 F (36.6 C)-98.8 F (37.1 C)] 97.8 F (36.6 C) (08/01 0406) Pulse Rate:  [72-93] 72 (08/01 0406) Resp:  [15-16] 16 (08/01 0406) BP: (129-141)/(76-85) 136/76 (08/01 0406) SpO2:  [94 %-97 %] 95 % (08/01 0406)  Neurologically intact ABD soft Neurovascular intact Sensation intact distally Intact pulses distally Dorsiflexion/Plantar flexion intact Incision: dressing C/D/I and no drainage No cellulitis present Compartment soft   LABS No results for input(s): HGB, WBC, PLT in the last 72 hours. No results for input(s): NA, K, CL, CO2, BUN, CREATININE, GLUCOSE in the last 72 hours. No results for input(s): LABPT, INR in the last 72 hours.   Assessment/Plan: 3 Days Post-Op Procedure(s) (LRB): LEFT REVERSE SHOULDER ARTHROPLASTY (Left)  Advance diet Up with therapy D/C IV fluids Discharge to SNF  Restart flomax. I will discuss with urology . In and out cath till able to void on his own.   Richard King 01/02/2020, 11:58 AM Patient ID: Richard King, male   DOB: Sep 08, 1940, 79 y.o.   MRN: [id number]

## 2020-01-02 NOTE — Progress Notes (Signed)
Patient having difficulty urinating. I&O catheterization done at 0815, resulting in output of 756mL. Bladder scan completed at 1500 showing volume of 240mL in bladder. Patient declines I&O at this time. States he will attempt to void and has been limiting his liquids today. Asked patient to increase his fluids and alert nursing staff if he experiences pain due to full bladder. Will continue to monitor. Massie Bougie, RN

## 2020-01-03 NOTE — Plan of Care (Signed)

## 2020-01-03 NOTE — Progress Notes (Signed)
Occupational Therapy Treatment Patient Details Name: Richard King MRN: [id number] DOB: Jan 28, 1941 Today's Date: 01/03/2020    History of present illness Pt is a 79 y.o. male s/p elective L reverse TSA on 12/30/19. PMH includes HTN, bladder CA.   OT comments  Pt aware of elbow through digits; pt introduced to seated and standing pendulums. Pt standing at sink for grooming task with minguardA overall and minguardA for toilet hygiene. Pt ambulating in room with minguardA and no AD. Pt requires cues for safety and to avoid obstacles at times, especially on L side- as pt sometimes ambulates too quickly. Pt would benefit from continued OT skilled services for ADL, mobility and safety in SNF setting. OT following acutely.     Follow Up Recommendations  SNF    Equipment Recommendations  Other (comment) (defer to next facility)    Recommendations for Other Services      Precautions / Restrictions Precautions Precautions: Shoulder;Fall Type of Shoulder Precautions: AROM elbow wrist and hand to tolerance, ok for pendulums, no AROM shoulder Shoulder Interventions: Shoulder sling/immobilizer;At all times;Off for dressing/bathing/exercises Precaution Booklet Issued: Yes (comment) Precaution Comments: provided handout and verbally reviewed Required Braces or Orthoses: Sling Restrictions Weight Bearing Restrictions: Yes LUE Weight Bearing: Non weight bearing       Mobility Bed Mobility Overal bed mobility: Needs Assistance Bed Mobility: Supine to Sit     Supine to sit: Supervision;HOB elevated     General bed mobility comments: Received sitting in recliner  Transfers Overall transfer level: Needs assistance Equipment used: None Transfers: Sit to/from [facility] Sit to Stand: Min guard Stand pivot transfers: Min guard       General transfer comment: minguardA to avoid plopping    Balance Overall balance assessment: Needs assistance Sitting-balance support: No  upper extremity supported;Feet supported Sitting balance-Leahy Scale: Fair     Standing balance support: No upper extremity supported;During functional activity Standing balance-Leahy Scale: Fair Standing balance comment: grooming tasks                           ADL either performed or assessed with clinical judgement   ADL Overall ADL's : Needs assistance/impaired     Grooming: Min guard;Standing                 Lower Body Dressing Details (indicate cue type and reason): able to adjust socks; assist to get them started Toilet Transfer: Min guard;Ambulation   Toileting- Clothing Manipulation and Hygiene: Min guard;Sitting/lateral lean;Sit to/from stand       Functional mobility during ADLs: Min guard;Rolling walker;Cueing for safety General ADL Comments: Education for sling wear schedule; HEP with elbow through digits and pendellums.     Vision   Vision Assessment?: No apparent visual deficits   Perception     Praxis      Cognition Arousal/Alertness: Awake/alert Behavior During Therapy: WFL for tasks assessed/performed Overall Cognitive Status: Within Functional Limits for tasks assessed                                          Exercises Exercises: Other exercises Other Exercises Other Exercises: elbow, wrist, hand, FMC; pendulums seated and standing x10 reps each   Shoulder Instructions       General Comments Pt aware of elbow through digits; pt introduced to seated and standing pendulums.    Pertinent  Vitals/ Pain       Pain Assessment: Faces Faces Pain Scale: Hurts even more Pain Location: L shoulder Pain Descriptors / Indicators: Discomfort;Sore Pain Intervention(s): Monitored during session  Home Living                                          Prior Functioning/Environment              Frequency  Min 2X/week        Progress Toward Goals  OT Goals(current goals can now be found in the  care plan section)  Progress towards OT goals: Progressing toward goals  Acute Rehab OT Goals Patient Stated Goal: to go to SNF prior to returning home OT Goal Formulation: With patient Time For Goal Achievement: 01/14/20 Potential to Achieve Goals: Good ADL Goals Pt Will Perform Grooming: with modified independence;standing Pt Will Perform Upper Body Dressing: with modified independence;sitting Pt Will Perform Lower Body Dressing: with modified independence;sit to/from stand Pt/caregiver will Perform Home Exercise Program: Increased ROM;Increased strength;Left upper extremity;With written HEP provided;Independently  Plan Discharge plan remains appropriate    Co-evaluation                 AM-PAC OT "6 Clicks" Daily Activity     Outcome Measure   Help from another person eating meals?: A Little Help from another person taking care of personal grooming?: A Little Help from another person toileting, which includes using toliet, bedpan, or urinal?: A Little Help from another person bathing (including washing, rinsing, drying)?: A Little Help from another person to put on and taking off regular upper body clothing?: A Little Help from another person to put on and taking off regular lower body clothing?: A Little 6 Click Score: 18    End of Session Equipment Utilized During Treatment: Gait belt  OT Visit Diagnosis: Unsteadiness on feet (R26.81);History of falling (Z91.81);Pain Pain - Right/Left: Left Pain - part of body: Shoulder   Activity Tolerance Patient limited by pain   Patient Left in chair;with call bell/phone within reach;with chair alarm set   Nurse Communication Mobility status        Time: 3383-2919 OT Time Calculation (min): 27 min  Charges: OT General Charges $OT Visit: 1 Visit OT Treatments $Self Care/Home Management : 8-22 mins $Therapeutic Exercise: 8-22 mins  Jefferey Pica, OTR/L Acute Rehabilitation Services Pager: [phone] Office:  [place] 01/03/2020, 4:05 PM

## 2020-01-03 NOTE — Progress Notes (Signed)
Pt refused bladder scan and would like to wait until morning

## 2020-01-03 NOTE — TOC Progression Note (Signed)
Transition of Care [facility]) - Progression Note    Patient Details  Name: [name] MRN: [id number] Date of Birth: [birth year]  Transition of Care [facility]) CM/SW [place], LCSW Phone Number: 01/03/2020, 9:10 AM  Clinical Narrative:    Patient has insurance approval to discharge to [facility]: #[id number], next review due 8/3/ to Mickel Baas at f. [phone].   Expected Discharge Plan: Skilled Nursing Facility Barriers to Discharge: SNF Pending bed offer, Insurance Authorization  Expected Discharge Plan and Services Expected Discharge Plan: [place] In-house Referral: Clinical Social Work   Post Acute Care Choice: [place] Living arrangements for the past 2 months: Single Family Home Expected Discharge Date: 01/02/20                                     Social Determinants of Health (SDOH) Interventions    Readmission Risk Interventions No flowsheet data found.

## 2020-01-03 NOTE — Progress Notes (Signed)
  Subjective: Richard King is a 79 y.o. male s/p left RSA.  They are POD4.  Pt's pain is controlled.  Patient notes he has not had a BM since prior to surgery.    Objective: Vital signs in last 24 hours: Temp:  [97.8 F (36.6 C)-98.6 F (37 C)] 98.6 F (37 C) (08/02 1433) Pulse Rate:  [64-80] 73 (08/02 1433) Resp:  [16-18] 18 (08/02 1433) BP: (110-134)/(67-83) 134/72 (08/02 1433) SpO2:  [91 %-98 %] 98 % (08/02 1433)  Intake/Output from previous day: 08/01 0701 - 08/02 0700 In: -  Out: 1200 [Urine:1200] Intake/Output this shift: No intake/output data recorded.  Exam:  No gross blood or drainage overlying the dressing 2+ radial pulse Sensation intact distally in the left hand Able to extend the left wrist   Labs: No results for input(s): HGB in the last 72 hours. No results for input(s): WBC, RBC, HCT, PLT in the last 72 hours. No results for input(s): NA, K, CL, CO2, BUN, CREATININE, GLUCOSE, CALCIUM in the last 72 hours. No results for input(s): LABPT, INR in the last 72 hours.  Assessment/Plan: Pt is POD4 s/p left RSA    -Plan to discharge to SNF tomorrow pending patient's pain  -No lifting with the operative arm   Gerrianne Scale Hanzel Pizzo 01/03/2020, 3:10 PM

## 2020-01-04 ENCOUNTER — Encounter (HOSPITAL_COMMUNITY): Payer: Self-pay | Admitting: Orthopedic Surgery

## 2020-01-04 LAB — URINALYSIS, ROUTINE W REFLEX MICROSCOPIC
Bacteria, UA: NONE SEEN
Bilirubin Urine: NEGATIVE
Glucose, UA: NEGATIVE mg/dL
Ketones, ur: NEGATIVE mg/dL
Nitrite: NEGATIVE
Protein, ur: 30 mg/dL — AB
RBC / HPF: >50 RBC/hpf — ABNORMAL HIGH (ref 0–5)
Specific Gravity, Urine: 1.024 (ref 1.005–1.030)
pH: 5 (ref 5.0–8.0)

## 2020-01-04 MED ORDER — SULFAMETHOXAZOLE-TRIMETHOPRIM 800-160 MG PO TABS
1.0000 | ORAL_TABLET | Freq: Two times a day (BID) | ORAL | 0 refills | Status: DC
Start: 2020-01-04 — End: 2020-01-06

## 2020-01-04 MED ORDER — POLYETHYLENE GLYCOL 3350 17 G PO PACK
17.0000 g | PACK | Freq: Once | ORAL | Status: AC
  Administered 2020-01-04: 17 g via ORAL
  Filled 2020-01-04: qty 1

## 2020-01-04 MED ORDER — TRAMADOL HCL 50 MG PO TABS: 100.0000 mg | ORAL_TABLET | Freq: Four times a day (QID) | ORAL | 0 refills | Status: DC | PRN

## 2020-01-04 MED ORDER — CIPROFLOXACIN IN D5W 200 MG/100ML IV SOLN
200.0000 mg | Freq: Once | INTRAVENOUS | Status: AC
  Administered 2020-01-04: 200 mg via INTRAVENOUS
  Filled 2020-01-04: qty 100

## 2020-01-04 NOTE — Progress Notes (Signed)
Physical Therapy Treatment Patient Details Name: Richard King MRN: [id number] DOB: August 07, 1940 Today's Date: 01/04/2020    History of Present Illness Pt is a 79 y.o. male s/p elective L reverse TSA on 12/30/19. PMH includes HTN, bladder CA.    PT Comments    Pt seated in room on arrival,  He remains impulsively with poor safety awareness.  Pt required cues to correct gt deviations as he presents with shuffling gt pattern and minor drift.,  He required min guard for safety throughout session.  Pt noticeably fatigued after short bout of gt training and also noted with urinary incontinence during session.  Pt remains to benefit from SNF placement to improve strength and function before returning home.  Of note his sling was on improperly after he attempted to donn himself.  Further training on sling placement would be beneficial in snf setting.    Follow Up Recommendations  SNF;Supervision for mobility/OOB     Equipment Recommendations   (defer to next level of care)    Recommendations for Other Services       Precautions / Restrictions Precautions Precautions: Shoulder;Fall Type of Shoulder Precautions: AROM elbow wrist and hand to tolerance, ok for pendulums, no AROM shoulder Shoulder Interventions: Shoulder sling/immobilizer;At all times;Off for dressing/bathing/exercises Required Braces or Orthoses: Sling Restrictions Weight Bearing Restrictions: Yes LUE Weight Bearing: Non weight bearing    Mobility  Bed Mobility               General bed mobility comments: Pt seated on loveseat in room.  Transfers Overall transfer level: Needs assistance Equipment used: None Transfers: Sit to/from Stand Sit to Stand: Min guard         General transfer comment: Min guard from various surfaces.  Cues for safety.  Ambulation/Gait Ambulation/Gait assistance: Min guard;Supervision Gait Distance (Feet): 100 Feet Assistive device: None Gait Pattern/deviations: Step-through  pattern;Decreased stride length;Staggering left;Trunk flexed;Shuffle Gait velocity: Decreased   General Gait Details: Continues with mild unsteadiness but no overt LOB this session.  Pt unable to navigate path back to room this session and noticeably fatigued with increased gt distance.  Poor foot clearance with cues to correct to improve step height   Stairs             Wheelchair Mobility    Modified Rankin (Stroke Patients Only)       Balance Overall balance assessment: Needs assistance Sitting-balance support: No upper extremity supported;Feet supported Sitting balance-Leahy Scale: Fair       Standing balance-Leahy Scale: Fair                              Cognition Arousal/Alertness: Awake/alert Behavior During Therapy: WFL for tasks assessed/performed Overall Cognitive Status: Within Functional Limits for tasks assessed                                 General Comments: pt reports he does not feel safe to d/c home yet; pt mildly impulsive at times      Exercises General Exercises - Lower Extremity Long Arc Quad: AROM;Both;10 reps;Seated Hip ABduction/ADduction: AROM;Both;10 reps;Supine Straight Leg Raises: AROM;Both;5 reps;Supine;Limitations Straight Leg Raises Limitations: poor ROM with noticeable fatigue only completing x5 reps each leg Hip Flexion/Marching: AROM;Both;10 reps;Seated    General Comments        Pertinent Vitals/Pain Pain Assessment: Faces Faces Pain Scale: Hurts little more Pain Location:  L shoulder Pain Descriptors / Indicators: Discomfort;Sore Pain Intervention(s): Monitored during session;Repositioned    Home Living                      Prior Function            PT Goals (current goals can now be found in the care plan section) Acute Rehab PT Goals Patient Stated Goal: to go to SNF prior to returning home Potential to Achieve Goals: Good Progress towards PT goals: Progressing toward  goals    Frequency    Min 3X/week      PT Plan Current plan remains appropriate    Co-evaluation              AM-PAC PT "6 Clicks" Mobility   Outcome Measure  Help needed turning from your back to your side while in a flat bed without using bedrails?: A Little Help needed moving from lying on your back to sitting on the side of a flat bed without using bedrails?: A Little Help needed moving to and from a bed to a chair (including a wheelchair)?: A Little Help needed standing up from a chair using your arms (e.g., wheelchair or bedside chair)?: A Little Help needed to walk in hospital room?: A Little Help needed climbing 3-5 steps with a railing? : A Little 6 Click Score: 18    End of Session Equipment Utilized During Treatment: Gait belt Activity Tolerance: Patient tolerated treatment well Patient left: in chair;with call bell/phone within reach;with chair alarm set Nurse Communication: Mobility status PT Visit Diagnosis: Other abnormalities of gait and mobility (R26.89);Difficulty in walking, not elsewhere classified (R26.2)     Time: 2542-7062 PT Time Calculation (min) (ACUTE ONLY): 11 min  Charges:  $Gait Training: 8-22 mins                     Richard King , PTA Acute Rehabilitation Services Pager [phone] Office [phone]     Richard King Richard King 01/04/2020, 10:49 AM

## 2020-01-04 NOTE — Progress Notes (Signed)
Spoke with [facility] about UA results.  With clinical symptoms and UA findings, will treat as UTI.  Patient has had pain with urination that he describes as burning.  Plan for one IV dose of Cipro today with discharge to SNF afterward.  Follow this with 7 day course of Bactrim DS BID.  Please inform SNF and instruct them to reach out to Dr. Randel Pigg office with any concerns.

## 2020-01-04 NOTE — Progress Notes (Signed)
Patient doing well today.  No new complaints aside from mild pain with urination. Will check UA prior to discharge.  Had one BM yesterday.  Shoulder pain well controlled with Tramadol.    2+ radial pulse of LUE Motor function of left hand intact Postop dressing in place without gross blood/drainage  Plan is discharge to SNF today.  Follow-up with Dr. Marlou Sa in clinic ~2 weeks postop.

## 2020-01-04 NOTE — TOC Transition Note (Addendum)
Transition of Care [facility]) - CM/SW Discharge Note   Patient Details  Name: Richard King MRN: [id number] Date of Birth: [birth year]  [facility]) CM/SW Contact:  Sharin Mons, RN Phone Number: [phone] 01/04/2020, 9:06 AM   Clinical Narrative:     Patient will DC to: [facility] Anticipated DC date: 01/04/2020 Family notified: Rodena Piety ( daughter0 Transport by: Corey Harold   Per MD patient ready for DC today. RN, patient, patient's family, and facility notified of DC. Discharge Summary and FL2 sent to facility. RN to call report prior to discharge [phone] ). DC packet on chart.   RNCM will sign off for now as intervention is no longer needed. Please consult [place] again if new needs arise.  8/3 4:54 pm   Pt  now with new UTI. Unable to d/c pt today. Need d/c summary UPDATED to reflect Bactrim DS for SNF . Per [facility] admissions pharmacy closes @ 3:00 pm, and they would not be able to receive pt after 7pm. Attepmts made to make provider aware by  NCM , awaiting reponse.    Barriers to Discharge: No Barriers Identified   Patient Goals and CMS Choice Patient states their goals for this hospitalization and ongoing recovery are:: Rehab CMS Medicare.gov Compare Post Acute Care list provided to:: Patient Choice offered to / list presented to : Patient  Discharge Placement                       Discharge Plan and Services In-house Referral: Clinical Social Work   Post Acute Care Choice: [place]                               Social Determinants of Health (SDOH) Interventions     Readmission Risk Interventions No flowsheet data found.

## 2020-01-04 NOTE — Discharge Summary (Signed)
Physician Discharge Summary      Patient ID: Richard King MRN: [id number] DOB/AGE: 1941/02/09 79 y.o.  Admit date: 12/30/2019 Discharge date: 01/04/2020  Admission Diagnoses:  Active Problems:   S/P reverse total shoulder arthroplasty, left   Discharge Diagnoses:  Same  Surgeries: Procedure(s): LEFT REVERSE SHOULDER ARTHROPLASTY on 12/30/2019   Consultants:   Discharged Condition: Stable  Hospital Course: Richard King is an 79 y.o. 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  They were brought to the operating room on 12/30/2019 and underwent the above named procedures.  Pt awoke from anesthesia without complication and was transferred to the floor. On POD1, patient was doing well and pain is controlled.  Plan was discharge to SNF due to lack of available social support with daughters who work full-time jobs and his wife being temporarily placed in a memory care facility.  He continued to mobilize with PT throughout his stay. He was discharged to SNF on 01/04/2020.  Pt will f/u with Dr. Marlou Sa in clinic in ~2 weeks.   Antibiotics given:  Anti-infectives (From admission, onward)   Start     Dose/Rate Route Frequency Ordered Stop   12/30/19 2000  ceFAZolin (ANCEF) IVPB 2g/100 mL premix        2 g 200 mL/hr over 30 Minutes Intravenous Every 6 hours 12/30/19 1819 12/31/19 0924   12/30/19 1200  ceFAZolin (ANCEF) IVPB 2g/100 mL premix        2 g 200 mL/hr over 30 Minutes Intravenous On call to O.R. 12/30/19 1021 12/30/19 1447    .  Recent vital signs:  Vitals:   01/04/20 0334 01/04/20 0740  BP: (!) 141/95 (!) 146/82  Pulse: 69 77  Resp: 17 17  Temp: 98.7 F (37.1 C) (!) 97.5 F (36.4 C)  SpO2: 95% 93%    Recent laboratory studies:  Results for orders placed or performed during the hospital encounter of 12/30/19  SARS CORONAVIRUS 2 (TAT 6-24 HRS) Nasopharyngeal Nasopharyngeal Swab   Specimen:  Nasopharyngeal Swab  Result Value Ref Range   SARS Coronavirus 2 NEGATIVE NEGATIVE    Discharge Medications:   Allergies as of 01/04/2020      Reactions   Ivp Dye [iodinated Diagnostic Agents] Swelling   Codeine    insomnia      Medication List    TAKE these medications   aspirin 81 MG EC tablet Take 1 tablet (81 mg total) by mouth daily. Swallow whole.   cyanocobalamin 1000 MCG/ML injection Commonly known as: (VITAMIN B-12) 1071mcg IM every 14 days   doxazosin 1 MG tablet Commonly known as: CARDURA Take 1-2 tablets (1-2 mg total) by mouth daily. What changed:   how much to take  when to take this   methocarbamol 500 MG tablet Commonly known as: ROBAXIN Take 1 tablet (500 mg total) by mouth every 6 (six) hours as needed for muscle spasms.   sertraline 25 MG tablet Commonly known as: ZOLOFT TAKE 1 TABLET BY MOUTH DAILY. What changed: when to take this   tamsulosin 0.4 MG Caps capsule Commonly known as: FLOMAX Take 1 capsule (0.4 mg total) by mouth daily after breakfast.   traMADol 50 MG tablet Commonly known as: ULTRAM Take 2 tablets (100 mg total) by mouth every 6 (six) hours as needed for moderate pain. What changed:   how much to take  reasons to take this   verapamil 120  MG CR tablet Commonly known as: CALAN-SR TAKE 1 TABLET BY MOUTH AT BEDTIME.       Diagnostic Studies: DG Shoulder Left Port  Result Date: 12/30/2019 CLINICAL DATA:  Status post shoulder surgery. EXAM: LEFT SHOULDER COMPARISON:  Preoperative shoulder CT and radiograph. FINDINGS: Reverse left shoulder arthroplasty. Glenoid and humeral components are congruent. Glenoid component appears inferiorly situated, possibly related to positioning. Recent postsurgical change includes air and edema in the joint space. IMPRESSION: 1. Recent reverse left shoulder arthroplasty. 2. Glenoid component appears inferiorly situated, possibly related to positioning. Electronically Signed   By: Keith Rake M.D.   On: 12/30/2019 18:28    Disposition: Discharge disposition: 01-Home or Self Care       Discharge Instructions    Call MD / Call 911   Complete by: As directed    If you experience chest pain or shortness of breath, CALL 911 and be transported to the hospital emergency room.  If you develope a fever above 101 F, pus (white drainage) or increased drainage or redness at the wound, or calf pain, call your surgeon's office.   Call MD / Call 911   Complete by: As directed    If you experience chest pain or shortness of breath, CALL 911 and be transported to the hospital emergency room.  If you develope a fever above 101 F, pus (white drainage) or increased drainage or redness at the wound, or calf pain, call your surgeon's office.   Constipation Prevention   Complete by: As directed    Drink plenty of fluids.  Prune juice may be helpful.  You may use a stool softener, such as Colace (over the counter) 100 mg twice a day.  Use MiraLax (over the counter) for constipation as needed.   Constipation Prevention   Complete by: As directed    Drink plenty of fluids.  Prune juice may be helpful.  You may use a stool softener, such as Colace (over the counter) 100 mg twice a day.  Use MiraLax (over the counter) for constipation as needed.   Diet - low sodium heart healthy   Complete by: As directed    Diet - low sodium heart healthy   Complete by: As directed    Discharge instructions   Complete by: As directed    You may shower, dressing is waterproof.  Do not bathe or soak the operative shoulder in a tub, pool.  Use the CPM machine 3 times a day for one hour each time.  No lifting with the operative shoulder. Continue use of the sling.  Follow-up with Dr. Marlou Sa in ~2 weeks on your given appointment date.  We will remove your adhesive bandage at that time.    Dental Antibiotics:  In most cases prophylactic antibiotics for Dental procdeures after total joint surgery are not  necessary.  Exceptions are as follows:  1. History of prior total joint infection  2. Severely immunocompromised (Organ Transplant, cancer chemotherapy, Rheumatoid biologic meds such as [place])  3. Poorly controlled diabetes (A1C &gt; 8.0, blood glucose over 200)  If you have one of these conditions, contact your surgeon for an antibiotic prescription, prior to your dental procedure.   Increase activity slowly as tolerated   Complete by: As directed    Increase activity slowly as tolerated   Complete by: As directed        Contact information for after-discharge care    [facility] .  Service: Skilled Nursing Contact information: [place] [phone]                   Signed: Donella Stade 01/04/2020, 11:17 AM

## 2020-01-05 ENCOUNTER — Observation Stay (HOSPITAL_COMMUNITY): Payer: Medicare PPO

## 2020-01-05 DIAGNOSIS — N400 Enlarged prostate without lower urinary tract symptoms: Secondary | ICD-10-CM

## 2020-01-05 DIAGNOSIS — M5136 Other intervertebral disc degeneration, lumbar region: Secondary | ICD-10-CM | POA: Diagnosis not present

## 2020-01-05 DIAGNOSIS — I10 Essential (primary) hypertension: Secondary | ICD-10-CM

## 2020-01-05 DIAGNOSIS — Z8551 Personal history of malignant neoplasm of bladder: Secondary | ICD-10-CM | POA: Diagnosis not present

## 2020-01-05 DIAGNOSIS — K5903 Drug induced constipation: Secondary | ICD-10-CM

## 2020-01-05 DIAGNOSIS — N200 Calculus of kidney: Secondary | ICD-10-CM | POA: Diagnosis not present

## 2020-01-05 DIAGNOSIS — R399 Unspecified symptoms and signs involving the genitourinary system: Secondary | ICD-10-CM | POA: Diagnosis not present

## 2020-01-05 DIAGNOSIS — Z96612 Presence of left artificial shoulder joint: Secondary | ICD-10-CM

## 2020-01-05 DIAGNOSIS — K5900 Constipation, unspecified: Secondary | ICD-10-CM

## 2020-01-05 DIAGNOSIS — K56609 Unspecified intestinal obstruction, unspecified as to partial versus complete obstruction: Secondary | ICD-10-CM | POA: Diagnosis not present

## 2020-01-05 LAB — LACTIC ACID, PLASMA
Lactic Acid, Venous: 1.3 mmol/L (ref 0.5–1.9)
Lactic Acid, Venous: 0.9 mmol/L (ref 0.5–1.9)

## 2020-01-05 LAB — CBC WITH DIFFERENTIAL/PLATELET
Abs Immature Granulocytes: 0.03 10*3/uL (ref 0.00–0.07)
Basophils Absolute: 0 10*3/uL (ref 0.0–0.1)
Basophils Relative: 1 %
Eosinophils Absolute: 0.2 10*3/uL (ref 0.0–0.5)
Eosinophils Relative: 2 %
HCT: 38.3 % — ABNORMAL LOW (ref 39.0–52.0)
Hemoglobin: 12.9 g/dL — ABNORMAL LOW (ref 13.0–17.0)
Immature Granulocytes: 0 %
Lymphocytes Relative: 7 %
Lymphs Abs: 0.6 10*3/uL — ABNORMAL LOW (ref 0.7–4.0)
MCH: 32.7 pg (ref 26.0–34.0)
MCHC: 33.7 g/dL (ref 30.0–36.0)
MCV: 97.2 fL (ref 80.0–100.0)
Monocytes Absolute: 1 10*3/uL (ref 0.1–1.0)
Monocytes Relative: 13 %
Neutro Abs: 6.4 10*3/uL (ref 1.7–7.7)
Neutrophils Relative %: 77 %
Platelets: 266 10*3/uL (ref 150–400)
RBC: 3.94 MIL/uL — ABNORMAL LOW (ref 4.22–5.81)
RDW: 13.1 % (ref 11.5–15.5)
WBC: 8.3 10*3/uL (ref 4.0–10.5)
nRBC: 0 % (ref 0.0–0.2)

## 2020-01-05 LAB — COMPREHENSIVE METABOLIC PANEL
ALT: 14 U/L (ref 0–44)
AST: 21 U/L (ref 15–41)
Albumin: 3.3 g/dL — ABNORMAL LOW (ref 3.5–5.0)
Alkaline Phosphatase: 52 U/L (ref 38–126)
Anion gap: 9 (ref 5–15)
BUN: 18 mg/dL (ref 8–23)
CO2: 30 mmol/L (ref 22–32)
Calcium: 10.3 mg/dL (ref 8.9–10.3)
Chloride: 103 mmol/L (ref 98–111)
Creatinine, Ser: 1.13 mg/dL (ref 0.61–1.24)
GFR calc Af Amer: >60 mL/min (ref >60)
GFR calc non Af Amer: >60 mL/min (ref >60)
Glucose, Bld: 152 mg/dL — ABNORMAL HIGH (ref 70–99)
Potassium: 5.4 mmol/L — ABNORMAL HIGH (ref 3.5–5.1)
Sodium: 142 mmol/L (ref 135–145)
Total Bilirubin: 1.1 mg/dL (ref 0.3–1.2)
Total Protein: 6.7 g/dL (ref 6.5–8.1)

## 2020-01-05 LAB — MAGNESIUM: Magnesium: 2.1 mg/dL (ref 1.7–2.4)

## 2020-01-05 MED ORDER — SULFAMETHOXAZOLE-TRIMETHOPRIM 800-160 MG PO TABS
1.0000 | ORAL_TABLET | Freq: Two times a day (BID) | ORAL | Status: DC
  Administered 2020-01-05: 1 via ORAL
  Filled 2020-01-05: qty 1

## 2020-01-05 MED ORDER — SODIUM ZIRCONIUM CYCLOSILICATE 10 G PO PACK
10.0000 g | PACK | Freq: Once | ORAL | Status: AC
  Administered 2020-01-05: 10 g via ORAL
  Filled 2020-01-05: qty 1

## 2020-01-05 MED ORDER — HYDRALAZINE HCL 20 MG/ML IJ SOLN: 10.0000 mg | Freq: Four times a day (QID) | INTRAMUSCULAR | Status: DC | PRN

## 2020-01-05 MED ORDER — DOCUSATE SODIUM 283 MG RE ENEM
1.0000 | ENEMA | Freq: Once | RECTAL | Status: AC
  Administered 2020-01-05: 283 mg via RECTAL
  Filled 2020-01-05 (×2): qty 1

## 2020-01-05 MED ORDER — SODIUM CHLORIDE 0.9 % IV SOLN
1.0000 g | INTRAVENOUS | Status: DC
  Administered 2020-01-05 – 2020-01-06 (×2): 1 g via INTRAVENOUS
  Filled 2020-01-05 (×2): qty 10

## 2020-01-05 NOTE — Progress Notes (Addendum)
SNF authorization extended x 1 day. Next review will be 8/5. Fax clinicals to [place].  [place] ID [id number].  Whitman Hero RN,BSN,CM

## 2020-01-05 NOTE — Consult Note (Signed)
Medical Consultation   Richard King  CNO:[id number]  DOB: 1940/12/01  DOA: 12/30/2019  PCP: Tonia Ghent, MD   Requesting physician: Dr. Marlou Sa  Reason for consultation: UTI, constipation & Hallucination  History of Present Illness: Richard King is an 79 y.o. 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  Patient reports lower abdominal pain which is constant, mild, 2-3 out of 10, nonradiating, no aggravating or relieving factors.  Reports nausea, difficulty with urination, increased urinary frequency however denies burning sensation, hematuria, back pain, fever, chills, vomiting, decreased appetite, generalized weakness or lethargy.  His UA came back positive for leukocytes and RBCs therefore he received IV Cipro 1 dose on 01/04/2020 and was started on p.o. Bactrim.  Patient reports that he continues to have lower abdominal pain.  Had bowel movement on Monday, has been passing gas only with straining.  He is currently on Senokot-which he tells me that is not helping him.  He had remote history of bladder cancer and current BPH.  He takes doxazosin at home for BPH.  He denies headache, blurry vision, chest pain, shortness of breath, palpitation, leg swelling, decreased appetite, sleep changes.  No history of smoking, alcohol, illicit drug use.  He is alert and oriented x3 upon my exam.  He denies hallucination.  Past Medical History:  Diagnosis Date  . Arthritis   . B12 deficiency   . Bladder cancer ([place])   . BPH (benign prostatic hypertrophy)   . ED (erectile dysfunction)   . History of kidney stones   . History of nephrolithiasis   . Hx of colonic polyps   . Hypertension   . Pneumonia    as a child         Review of Systems:  ROS As per HPI otherwise 10 point review of systems negative.      Past Medical History: Past Medical History:  Diagnosis Date  . Arthritis   . B12 deficiency   . Bladder cancer ([place])   . BPH (benign prostatic hypertrophy)   . ED (erectile dysfunction)   . History of kidney stones   . History of nephrolithiasis   . Hx of colonic polyps   . Hypertension   . Pneumonia    as a child    Past Surgical History: Past Surgical History:  Procedure Laterality Date  . bladder cancer shaving     hx bladder cancer  . CATARACT EXTRACTION    . COLONOSCOPY    . LITHOTRIPSY     per Dr. Risa Grill  . POLYPECTOMY  2009   [facility]  . REVERSE SHOULDER ARTHROPLASTY Left 12/30/2019   Procedure: LEFT REVERSE SHOULDER ARTHROPLASTY;  Surgeon: Meredith Pel, MD;  Location: [facility];  Service: Orthopedics;  Laterality: Left;  . 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 Ivp Dye [Iodinated Diagnostic Agents] Swelling  . Codeine     insomnia     Social History:  reports that he quit smoking about 43 years ago. He has never used smokeless tobacco. He reports that he does not drink alcohol and does not use drugs.   Family History: Family History  Problem Relation Age of Onset  . Hypertension Father   .  AAA (abdominal aortic aneurysm) Father   . Stroke Sister   . Cancer Neg Hx   . Diabetes Neg Hx   . Colon cancer Neg Hx   . Prostate cancer Neg Hx       Physical Exam: Vitals:   01/04/20 0740 01/04/20 1954 01/05/20 0404 01/05/20 1035  BP: (!) 146/82 (!) 148/95 (!) 162/89 (!) 157/87  Pulse: 77 84 76 78  Resp: 17 17 18 15   Temp: (!) 97.5 F (36.4 C) (!) 97.4 F (36.3 C) (!) 97.4 F (36.3 C) 97.8 F (36.6 C)  TempSrc: Oral Oral Oral Oral  SpO2: 93% 94% 95% 95%  Weight:      Height:        Constitutional: Appearance,  Alert and awake, oriented x3, not in any acute distress. Eyes: PERLA, EOMI, irises appear normal, anicteric sclera,  ENMT:  external ears and nose appear normal, normal hearing or hard of hearing            Lips appears normal, oropharynx mucosa, tongue, posterior pharynx appear normal  Neck: neck appears normal, no masses, normal ROM, no thyromegaly, no JVD  CVS: S1-S2 clear, no murmur rubs or gallops, no LE edema, normal pedal pulses  Respiratory:  clear to auscultation bilaterally, no wheezing, rales or rhonchi. Respiratory effort normal. No accessory muscle use.  Abdomen: Obese abdomen, lower abdominal tenderness noted.  No guarding, no rigidity, bowel sounds sluggish.   Musculoskeletal: : no cyanosis, clubbing or edema noted bilaterally                       Joint/bones/muscle exam, strength, contractures or atrophy no CVA tenderness bilaterally noted on exam. Neuro: Cranial nerves II-XII intact, strength, sensation, reflexes Psych: judgement and insight appear normal, stable mood and affect, mental status Skin: no rashes or lesions or ulcers, no induration or nodules    Data reviewed:  I have personally reviewed following labs and imaging studies Labs:  CBC: Recent Labs  Lab 01/05/20 1243  WBC 8.3  NEUTROABS 6.4  HGB 12.9*  HCT 38.3*  MCV 97.2  PLT 518    Basic Metabolic Panel: Recent Labs  Lab 01/05/20 1243  NA 142  K 5.4*  CL 103  CO2 30  GLUCOSE 152*  BUN 18  CREATININE 1.13  CALCIUM 10.3   GFR Estimated Creatinine Clearance: 65.6 mL/min (by C-G formula based on SCr of 1.13 mg/dL). Liver Function Tests: Recent Labs  Lab 01/05/20 1243  AST 21  ALT 14  ALKPHOS 52  BILITOT 1.1  PROT 6.7  ALBUMIN 3.3*   No results for input(s): LIPASE, AMYLASE in the last 168 hours. No results for input(s): AMMONIA in the last 168 hours. Coagulation profile No results for input(s): INR, PROTIME in the last 168 hours.  Cardiac Enzymes: No results for input(s): CKTOTAL, CKMB, CKMBINDEX, TROPONINI in the last 168 hours. BNP: Invalid input(s): POCBNP CBG: No results for input(s): GLUCAP in  the last 168 hours. D-Dimer No results for input(s): DDIMER in the last 72 hours. Hgb A1c No results for input(s): HGBA1C in the last 72 hours. Lipid Profile No results for input(s): CHOL, HDL, LDLCALC, TRIG, CHOLHDL, LDLDIRECT in the last 72 hours. Thyroid function studies No results for input(s): TSH, T4TOTAL, T3FREE, THYROIDAB in the last 72 hours.  Invalid input(s): FREET3 Anemia work up No results for input(s): VITAMINB12, FOLATE, FERRITIN, TIBC, IRON, RETICCTPCT in the last 72 hours. Urinalysis    Component Value Date/Time   COLORURINE  YELLOW 01/04/2020 1330   APPEARANCEUR HAZY (A) 01/04/2020 1330   LABSPEC 1.024 01/04/2020 1330   PHURINE 5.0 01/04/2020 1330   GLUCOSEU NEGATIVE 01/04/2020 1330   GLUCOSEU NEGATIVE 09/02/2014 1432   HGBUR LARGE (A) 01/04/2020 1330   BILIRUBINUR NEGATIVE 01/04/2020 1330   BILIRUBINUR Neg 05/18/2015 0953   KETONESUR NEGATIVE 01/04/2020 1330   PROTEINUR 30 (A) 01/04/2020 1330   UROBILINOGEN 4.0 05/18/2015 0953   UROBILINOGEN 0.2 09/02/2014 1432   NITRITE NEGATIVE 01/04/2020 1330   LEUKOCYTESUR SMALL (A) 01/04/2020 1330     Microbiology Recent Results (from the past 240 hour(s))  Urine culture     Status: None   Collection Time: 12/27/19  2:40 PM   Specimen: Urine, Clean Catch  Result Value Ref Range Status   Specimen Description URINE, CLEAN CATCH  Final   Special Requests NONE  Final   Culture   Final    NO GROWTH Performed at [facility], [place] [address] [place], [place] [address]    Report Status 12/28/2019 FINAL  Final  Surgical pcr screen     Status: Abnormal   Collection Time: 12/27/19  2:41 PM   Specimen: Nasal Mucosa; Nasal Swab  Result Value Ref Range Status   MRSA, PCR NEGATIVE NEGATIVE Final   Staphylococcus aureus POSITIVE (A) NEGATIVE Final    Comment: (NOTE) The Xpert SA Assay (FDA approved for NASAL specimens in patients 62 years of age and older), is one component of a comprehensive surveillance program.  It is not intended to diagnose infection nor to guide or monitor treatment. Performed at [facility], [place] [address], [place], [place] [address]   SARS CORONAVIRUS 2 (TAT 6-24 HRS) Nasopharyngeal Nasopharyngeal Swab     Status: None   Collection Time: 12/27/19  3:19 PM   Specimen: Nasopharyngeal Swab  Result Value Ref Range Status   SARS Coronavirus 2 NEGATIVE NEGATIVE Final    Comment: (NOTE) SARS-CoV-2 target nucleic acids are NOT DETECTED.  The SARS-CoV-2 RNA is generally detectable in upper and lower respiratory specimens during the acute phase of infection. Negative results do not preclude SARS-CoV-2 infection, do not rule out co-infections with other pathogens, and should not be used as the sole basis for treatment or other patient management decisions. Negative results must be combined with clinical observations, patient history, and epidemiological information. The expected result is Negative.  Fact Sheet for Patients: SugarRoll.be  Fact Sheet for Healthcare Providers: https://www.woods-mathews.com/  This test is not yet approved or cleared by the [place] FDA and  has been authorized for detection and/or diagnosis of SARS-CoV-2 by FDA under an Emergency Use Authorization (EUA). This EUA will remain  in effect (meaning this test can be used) for the duration of the COVID-19 declaration under Se ction 564(b)(1) of the Act, 21 U.S.C. section 360bbb-3(b)(1), unless the authorization is terminated or revoked sooner.  Performed at [facility], [place] [address] [place], [place] [address]   SARS CORONAVIRUS 2 (TAT 6-24 HRS) Nasopharyngeal Nasopharyngeal Swab     Status: None   Collection Time: 01/02/20  3:46 PM   Specimen: Nasopharyngeal Swab  Result Value Ref Range Status   SARS Coronavirus 2 NEGATIVE NEGATIVE Final    Comment: (NOTE) SARS-CoV-2 target nucleic acids are NOT DETECTED.  The SARS-CoV-2 RNA is  generally detectable in upper and lower respiratory specimens during the acute phase of infection. Negative results do not preclude SARS-CoV-2 infection, do not rule out co-infections with other pathogens, and should  not be used as the sole basis for treatment or other patient management decisions. Negative results must be combined with clinical observations, patient history, and epidemiological information. The expected result is Negative.  Fact Sheet for Patients: SugarRoll.be  Fact Sheet for Healthcare Providers: https://www.woods-mathews.com/  This test is not yet approved or cleared by the [place] FDA and  has been authorized for detection and/or diagnosis of SARS-CoV-2 by FDA under an Emergency Use Authorization (EUA). This EUA will remain  in effect (meaning this test can be used) for the duration of the COVID-19 declaration under Se ction 564(b)(1) of the Act, 21 U.S.C. section 360bbb-3(b)(1), unless the authorization is terminated or revoked sooner.  Performed at [facility], [place] [address] [place], [place] [address]        Inpatient Medications:   Scheduled Meds: . aspirin EC  81 mg Oral Daily  . docusate sodium  100 mg Oral BID  . docusate sodium  1 enema Rectal Once  . doxazosin  2 mg Oral QHS  . mupirocin ointment   Nasal BID  . senna-docusate  1 tablet Oral BID  . sertraline  25 mg Oral QHS  . sodium zirconium cyclosilicate  10 g Oral Once  . verapamil  120 mg Oral QHS   Continuous Infusions: . cefTRIAXone (ROCEPHIN)  IV    . lactated ringers 75 mL/hr at 12/31/19 0446     Radiological Exams on Admission: DG Abd 2 Views  Result Date: 01/05/2020 CLINICAL DATA:  Small-bowel obstruction, history bladder cancer EXAM: ABDOMEN - 2 VIEW COMPARISON:  09/26/2014 FINDINGS: Lung bases clear. Scattered stool throughout colon. No bowel dilatation or bowel wall thickening. Multiple RIGHT renal calculi, largest at  inferior pole 18 x 8 mm. 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 IMPRESSION: Nonobstructive bowel gas pattern. Multiple RIGHT renal calculi. Electronically Signed   By: Lavonia Dana M.D.   On: 01/05/2020 14:01    Impression/Recommendations Active Problems:   Essential hypertension   BPH (benign prostatic hyperplasia)   Lower urinary tract symptoms (LUTS)   S/P reverse total shoulder arthroplasty, left   Constipation   UTI: -Patient is complaining of urinary symptoms including lower abdominal pain and increased urinary frequency.  Has history of BPH and remote history of bladder cancer.  He is afebrile.  UA is positive for leukocytes, WBC and RBCs.   -Patient was given Cipro IV once yesterday and started on p.o. Bactrim.  Urine culture was not sent before start on antibiotics. -Bladder scan shows urinary retention-in and out cath was done and 950 cc of urine removed.  -Check lactic acid, CBC and CMP, urine culture -We will start patient on IV Rocephin -Tylenol as needed for fever more than 100.4.  Hallucination/delirium: -Likely Secondary to underlying UTI  Constipation: Last bowel movement on Monday.  Has been passing gas.  Could be secondary to opioid use.   -Abdominal x-ray shows nonobstructive bowel gas pattern and multiple right renal calculi. -We will give enema once.  Right renal calculi: -Noted on X-ray. Pt is afebrile, no leukocytosis, NO CVA tenderness, Renal function: WNL. Will hold of CT/renal US for now.  Right shoulder arthritis status post reverse total shoulder arthroplasty on 7/29 -Managed by Ortho  BPH: Continue doxazosin  Depression: Continue Zoloft  Hypertension: Blood pressure is elevated.  Could be secondary to pain -Continue verapamil and doxazosin -We will add hydralazine as needed for blood pressure more than 160/100.  Hyperkalemia: -Likely secondary to Bactrim.  Lokelma  p.o. once given. -Check magnesium level.   Repeat BMP tomorrow a.m.  Thank you for this consultation.  Our [facility] hospitalist team will follow the patient with you.   Time Spent: 30 minutes  Mckinley Jewel M.D. [facility] 01/05/2020, 2:11 PM

## 2020-01-05 NOTE — Progress Notes (Signed)
Occupational Therapy Treatment Patient Details Name: Richard King MRN: [id number] DOB: 1941/02/21 Today's Date: 01/05/2020    History of present illness Pt is a 79 y.o. male s/p elective L reverse TSA on 12/30/19. PMH includes HTN, bladder CA.   OT comments  Pt sitting in chair upon arrival, reporting feeling anxious and stating "if someone doesn't figure this (pointing to abdomen), I am going to die". RN aware and provided pt with medication to have BM, pt passing gas during session. Pt currently requiring minguard for functional mobility and modA for donning/doffing sling, proper positioning of LUE, and minguard for LUE therex. Pt will continue to benefit from skilled OT services to maximize safety and independence with ADL/IADL and functional mobility. Will continue to follow acutely and progress as tolerated.    Follow Up Recommendations  SNF    Equipment Recommendations  Other (comment) (defer to next facility)    Recommendations for Other Services      Precautions / Restrictions Precautions Precautions: Shoulder;Fall Type of Shoulder Precautions: AROM elbow wrist and hand to tolerance, ok for pendulums, no AROM shoulder Shoulder Interventions: Shoulder sling/immobilizer;At all times;Off for dressing/bathing/exercises Precaution Booklet Issued: Yes (comment) Precaution Comments: provided handout and verbally reviewed Required Braces or Orthoses: Sling Restrictions Weight Bearing Restrictions: Yes LUE Weight Bearing: Non weight bearing       Mobility Bed Mobility Overal bed mobility: Needs Assistance Bed Mobility: Supine to Sit     Supine to sit: Supervision;HOB elevated     General bed mobility comments: pt sitting in recliner  Transfers Overall transfer level: Needs assistance Equipment used: None Transfers: Sit to/from Stand Sit to Stand: Min guard Stand pivot transfers: Min guard       General transfer comment: minguard for safety, cues for safe hand  placement, mild instability initially, pt taking time to regain stability, no physical assistance needed    Balance Overall balance assessment: Needs assistance Sitting-balance support: No upper extremity supported;Feet supported Sitting balance-Leahy Scale: Fair     Standing balance support: No upper extremity supported;During functional activity Standing balance-Leahy Scale: Fair Standing balance comment: static standing without UE support and no LOB noted                           ADL either performed or assessed with clinical judgement   ADL Overall ADL's : Needs assistance/impaired Eating/Feeding: Set up;Sitting Eating/Feeding Details (indicate cue type and reason): assistance opening containers Grooming: Min guard;Standing   Upper Body Bathing: Min guard;Sitting   Lower Body Bathing: Minimal assistance;Sit to/from stand Lower Body Bathing Details (indicate cue type and reason): minA for stability Upper Body Dressing : Moderate assistance;Sitting Upper Body Dressing Details (indicate cue type and reason): modA for donning/doffing sling and for UB dressing Lower Body Dressing: Moderate assistance;Sit to/from stand Lower Body Dressing Details (indicate cue type and reason): able to adjust socks; assist to get them started Toilet Transfer: Min guard;Ambulation Toilet Transfer Details (indicate cue type and reason): attempted to have BM, passing gas but no BM, RN aware Toileting- Clothing Manipulation and Hygiene: Min guard;Sit to/from stand       Functional mobility during ADLs: Min guard;Cueing for safety General ADL Comments: pt continues to require assistance for sling wear schedule and donning/doffing and proper positioning of LUE     Vision   Vision Assessment?: No apparent visual deficits   Perception     Praxis      Cognition Arousal/Alertness: Awake/alert Behavior During Therapy:  WFL for tasks assessed/performed Overall Cognitive Status: Within  Functional Limits for tasks assessed                                 General Comments: pt reports visual hallucinating in previous days, no hallucinations this date, RN aware        Exercises Exercises: Other exercises General Exercises - Lower Extremity Ankle Circles/Pumps: AROM;Both;20 reps;Supine Quad Sets: AROM;Both;10 reps;Supine Long Arc Quad: AROM;Both;10 reps;Seated Heel Slides: AROM;Both;10 reps;Supine Hip ABduction/ADduction: AROM;Both;10 reps;Supine Straight Leg Raises: AROM;Both;5 reps;Supine;Limitations Straight Leg Raises Limitations: poor ROM with noticeable fatigue only completing x5 reps each leg Hip Flexion/Marching: AROM;Both;10 reps;Seated Other Exercises Other Exercises: elbow, wrist, hand, [place]; pendulums seated and standing x10 reps each   Shoulder Instructions       General Comments pt reports feeling anxious and stating if someone does figure "this" out pointing to his abdomen "I am going to die", educated pt on Md treating for UTI and RN providing meds for BM    Pertinent Vitals/ Pain       Pain Assessment: Faces Faces Pain Scale: Hurts little more Pain Location: L shoulder Pain Descriptors / Indicators: Discomfort;Sore Pain Intervention(s): Monitored during session;Limited activity within patient's tolerance  Home Living                                          Prior Functioning/Environment              Frequency  Min 2X/week        Progress Toward Goals  OT Goals(current goals can now be found in the care plan section)  Progress towards OT goals: Progressing toward goals  Acute Rehab OT Goals Patient Stated Goal: to go to SNF prior to returning home OT Goal Formulation: With patient Time For Goal Achievement: 01/14/20 Potential to Achieve Goals: Good ADL Goals Pt Will Perform Grooming: with modified independence;standing Pt Will Perform Upper Body Dressing: with modified independence;sitting Pt  Will Perform Lower Body Dressing: with modified independence;sit to/from stand Pt/caregiver will Perform Home Exercise Program: Increased ROM;Increased strength;Left upper extremity;With written HEP provided;Independently  Plan Discharge plan remains appropriate    Co-evaluation                 AM-PAC OT "6 Clicks" Daily Activity     Outcome Measure   Help from another person eating meals?: A Little Help from another person taking care of personal grooming?: A Little Help from another person toileting, which includes using toliet, bedpan, or urinal?: A Little Help from another person bathing (including washing, rinsing, drying)?: A Little Help from another person to put on and taking off regular upper body clothing?: A Little Help from another person to put on and taking off regular lower body clothing?: A Little 6 Click Score: 18    End of Session Equipment Utilized During Treatment: Gait belt;Other (comment) (sling)  OT Visit Diagnosis: Unsteadiness on feet (R26.81);History of falling (Z91.81);Pain Pain - Right/Left: Left Pain - part of body: Shoulder   Activity Tolerance Patient tolerated treatment well   Patient Left in chair;with call bell/phone within reach;with chair alarm set   Nurse Communication Mobility status        Time: 6440-3474 OT Time Calculation (min): 19 min  Charges: OT General Charges $OT Visit: 1 Visit OT Treatments $Self  Care/Home Management : 8-22 mins  Helene Kelp OTR/L Acute Rehabilitation Services Office: [place] 01/05/2020, 11:30 AM

## 2020-01-05 NOTE — Progress Notes (Signed)
Patient is s/p left reverse shoulder arthroplasty on 12/30/2019.  His shoulder pain is well controlled.  He does complain of continued pain with urination.  He also notes hallucinations over the last 2 to 3 days where he describes what appears to be stream varies that are on the floor and around the room.  He also complains of increased abdominal pain diffusely throughout his abdomen over the last day.  He has had 1 bowel movement since surgery.  He is passing gas regularly.  He notes he still has a strong appetite.  With new symptoms, plan to consult hospitalist team for their opinion.  Concern for opioid-induced constipation versus bowel obstruction.  Hold off on discharge to skilled nursing facility until medically stable.

## 2020-01-05 NOTE — Progress Notes (Signed)
Physical Therapy Treatment Patient Details Name: Richard King MRN: [id number] DOB: 1940/10/16 Today's Date: 01/05/2020    History of Present Illness Pt is a 79 y.o. male s/p elective L reverse TSA on 12/30/19. PMH includes HTN, bladder CA.    PT Comments    PTA focused on lower body strengthening as he was fatigued from OT session.  Pt performed exercises with noticeable DOE.  SPO2 89-91% on RA,.  Performed short bout of gt to door and back with patient reaching for door frame for support.  Continue to recommend snf placement.  Of note: Pt complains of discomfort in L groin with hip flexion.     Follow Up Recommendations  SNF;Supervision for mobility/OOB     Equipment Recommendations   (defer to next level of care.)    Recommendations for Other Services       Precautions / Restrictions Precautions Precautions: Shoulder;Fall Type of Shoulder Precautions: AROM elbow wrist and hand to tolerance, ok for pendulums, no AROM shoulder Shoulder Interventions: Shoulder sling/immobilizer;At all times;Off for dressing/bathing/exercises Precaution Booklet Issued: Yes (comment) Precaution Comments: provided handout and verbally reviewed Required Braces or Orthoses: Sling Restrictions Weight Bearing Restrictions: Yes LUE Weight Bearing: Non weight bearing    Mobility  Bed Mobility            General bed mobility comments: Pt seated in recliner on arrival.  Transfers Overall transfer level: Needs assistance Equipment used: None Transfers: Sit to/from Stand Sit to Stand: Min guard Stand pivot transfers: Min guard       General transfer comment: Min guard from various surfaces.  Cues for safety.  Ambulation/Gait Ambulation/Gait assistance: Min guard;Supervision Gait Distance (Feet): 20 Feet Assistive device: None Gait Pattern/deviations: Step-through pattern;Decreased stride length;Staggering left;Trunk flexed;Shuffle     General Gait Details: Decreased distance due to  fatigue, noted to grab door frame for support but no overt LOB.   Stairs             Wheelchair Mobility    Modified Rankin (Stroke Patients Only)       Balance Overall balance assessment: Needs assistance Sitting-balance support: No upper extremity supported;Feet supported Sitting balance-Leahy Scale: Fair     Standing balance support: No upper extremity supported;During functional activity Standing balance-Leahy Scale: Fair Standing balance comment: grooming tasks                            Cognition Arousal/Alertness: Awake/alert Behavior During Therapy: WFL for tasks assessed/performed Overall Cognitive Status: Within Functional Limits for tasks assessed                                 General Comments: pt reports hallucinating in previous days, no hallucinations this date      Exercises General Exercises - Lower Extremity Ankle Circles/Pumps: AROM;Both;20 reps;Supine Quad Sets: AROM;Both;10 reps;Supine Long Arc Quad: AROM;Both;10 reps;Seated Heel Slides: AROM;Both;10 reps;Supine Hip ABduction/ADduction: AROM;Both;10 reps;Supine Straight Leg Raises: AROM;Both;5 reps;Supine;Limitations Straight Leg Raises Limitations: poor ROM with noticeable fatigue only completing x5 reps each leg Hip Flexion/Marching: AROM;Both;10 reps;Seated     General Comments        Pertinent Vitals/Pain Pain Assessment: Faces Faces Pain Scale: Hurts little more Pain Location: L shoulder Pain Descriptors / Indicators: Discomfort;Sore Pain Intervention(s): Monitored during session;Repositioned    Home Living  Prior Function            PT Goals (current goals can now be found in the care plan section) Acute Rehab PT Goals Patient Stated Goal: to go to SNF prior to returning home Potential to Achieve Goals: Good Progress towards PT goals: Progressing toward goals    Frequency    Min 3X/week      PT Plan  Current plan remains appropriate    Co-evaluation              AM-PAC PT "6 Clicks" Mobility   Outcome Measure  Help needed turning from your back to your side while in a flat bed without using bedrails?: A Little Help needed moving from lying on your back to sitting on the side of a flat bed without using bedrails?: A Little Help needed moving to and from a bed to a chair (including a wheelchair)?: A Little Help needed standing up from a chair using your arms (e.g., wheelchair or bedside chair)?: A Little Help needed to walk in hospital room?: A Little Help needed climbing 3-5 steps with a railing? : A Little 6 Click Score: 18    End of Session Equipment Utilized During Treatment: Gait belt Activity Tolerance: Patient tolerated treatment well Patient left: in chair;with call bell/phone within reach;with chair alarm set Nurse Communication: Mobility status PT Visit Diagnosis: Other abnormalities of gait and mobility (R26.89);Difficulty in walking, not elsewhere classified (R26.2)     Time: 9604-5409 PT Time Calculation (min) (ACUTE ONLY): 11 min  Charges:  $Therapeutic Exercise: 8-22 mins                     Richard King , PTA Acute Rehabilitation Services Pager [phone] Office [phone]     Richard King 01/05/2020, 10:43 AM

## 2020-01-05 NOTE — Plan of Care (Signed)

## 2020-01-06 DIAGNOSIS — Z96612 Presence of left artificial shoulder joint: Secondary | ICD-10-CM | POA: Diagnosis not present

## 2020-01-06 DIAGNOSIS — I10 Essential (primary) hypertension: Secondary | ICD-10-CM | POA: Diagnosis not present

## 2020-01-06 DIAGNOSIS — R339 Retention of urine, unspecified: Secondary | ICD-10-CM | POA: Diagnosis not present

## 2020-01-06 DIAGNOSIS — Z7982 Long term (current) use of aspirin: Secondary | ICD-10-CM | POA: Diagnosis not present

## 2020-01-06 DIAGNOSIS — M19012 Primary osteoarthritis, left shoulder: Secondary | ICD-10-CM | POA: Diagnosis not present

## 2020-01-06 DIAGNOSIS — N390 Urinary tract infection, site not specified: Secondary | ICD-10-CM | POA: Insufficient documentation

## 2020-01-06 DIAGNOSIS — K5903 Drug induced constipation: Secondary | ICD-10-CM | POA: Diagnosis not present

## 2020-01-06 DIAGNOSIS — Z7401 Bed confinement status: Secondary | ICD-10-CM | POA: Diagnosis not present

## 2020-01-06 DIAGNOSIS — Z8551 Personal history of malignant neoplasm of bladder: Secondary | ICD-10-CM | POA: Diagnosis not present

## 2020-01-06 DIAGNOSIS — Z87891 Personal history of nicotine dependence: Secondary | ICD-10-CM | POA: Diagnosis not present

## 2020-01-06 DIAGNOSIS — Z20822 Contact with and (suspected) exposure to covid-19: Secondary | ICD-10-CM | POA: Diagnosis not present

## 2020-01-06 DIAGNOSIS — N401 Enlarged prostate with lower urinary tract symptoms: Secondary | ICD-10-CM | POA: Diagnosis not present

## 2020-01-06 DIAGNOSIS — N400 Enlarged prostate without lower urinary tract symptoms: Secondary | ICD-10-CM | POA: Diagnosis not present

## 2020-01-06 DIAGNOSIS — F329 Major depressive disorder, single episode, unspecified: Secondary | ICD-10-CM | POA: Diagnosis not present

## 2020-01-06 DIAGNOSIS — Z466 Encounter for fitting and adjustment of urinary device: Secondary | ICD-10-CM | POA: Diagnosis not present

## 2020-01-06 DIAGNOSIS — M2550 Pain in unspecified joint: Secondary | ICD-10-CM | POA: Diagnosis not present

## 2020-01-06 DIAGNOSIS — R399 Unspecified symptoms and signs involving the genitourinary system: Secondary | ICD-10-CM | POA: Diagnosis not present

## 2020-01-06 DIAGNOSIS — Z471 Aftercare following joint replacement surgery: Secondary | ICD-10-CM | POA: Diagnosis not present

## 2020-01-06 DIAGNOSIS — E039 Hypothyroidism, unspecified: Secondary | ICD-10-CM | POA: Diagnosis not present

## 2020-01-06 DIAGNOSIS — D649 Anemia, unspecified: Secondary | ICD-10-CM | POA: Diagnosis not present

## 2020-01-06 DIAGNOSIS — Z8659 Personal history of other mental and behavioral disorders: Secondary | ICD-10-CM | POA: Diagnosis not present

## 2020-01-06 LAB — BASIC METABOLIC PANEL
Anion gap: 9 (ref 5–15)
BUN: 18 mg/dL (ref 8–23)
CO2: 29 mmol/L (ref 22–32)
Calcium: 9.3 mg/dL (ref 8.9–10.3)
Chloride: 105 mmol/L (ref 98–111)
Creatinine, Ser: 0.96 mg/dL (ref 0.61–1.24)
GFR calc Af Amer: >60 mL/min (ref >60)
GFR calc non Af Amer: >60 mL/min (ref >60)
Glucose, Bld: 111 mg/dL — ABNORMAL HIGH (ref 70–99)
Potassium: 4.2 mmol/L (ref 3.5–5.1)
Sodium: 143 mmol/L (ref 135–145)

## 2020-01-06 LAB — URINE CULTURE: Culture: NO GROWTH

## 2020-01-06 MED ORDER — CEPHALEXIN 250 MG PO CAPS: 250.0000 mg | ORAL_CAPSULE | Freq: Four times a day (QID) | ORAL | 0 refills | Status: AC

## 2020-01-06 MED ORDER — CHLORHEXIDINE GLUCONATE CLOTH 2 % EX PADS
6.0000 | MEDICATED_PAD | Freq: Every day | CUTANEOUS | Status: DC
  Administered 2020-01-06: 6 via TOPICAL

## 2020-01-06 NOTE — Progress Notes (Signed)
PROGRESS NOTE    Richard King  HKV:[id number] DOB: Dec 04, 1940 DOA: 12/30/2019 PCP: Tonia Ghent, MD   Brief Narrative:  Richard King is an 79 y.o. 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 Patient reports lower abdominal pain which is constant, mild, 2-3 out of 10, nonradiating, no aggravating or relieving factors.  Reports nausea, difficulty with urination, increased urinary frequency however denies burning sensation, hematuria, back pain, fever, chills, vomiting, decreased appetite, generalized weakness or lethargy.  His UA came back positive for leukocytes and RBCs therefore he received IV Cipro 1 dose on 01/04/2020 and was started on p.o. Bactrim.  Patient reports that he continues to have lower abdominal pain.  Had bowel movement on Monday, has been passing gas only with straining.  He is currently on Senokot-which he tells me that is not helping him. He had remote history of bladder cancer and current BPH.  He takes doxazosin at home for BPH. He denies headache, blurry vision, chest pain, shortness of breath, palpitation, leg swelling, decreased appetite, sleep changes.  No history of smoking, alcohol, illicit drug use.  He is alert and oriented x3 upon my exam.  He denies hallucination.  Assessment & Plan:   Active Problems:   Essential hypertension   BPH (benign prostatic hyperplasia)   Lower urinary tract symptoms (LUTS)   S/P reverse total shoulder arthroplasty, left   Constipation   Acute urinary obstruction, cannot rule out concurrent UTI -Known history of BPH and remote history of bladder cancer status post treatment -In and out cath overnight x2, repeat issues this morning, will place Foley catheter with outpatient urology follow-up -Transition patient to p.o. Keflex, can follow outpatient for lingering symptoms  with repeat UA -Patient's mental status appears to be resolving, back to baseline ANO x4 this morning  Acute delirium -Questionably in the setting of UTI and obstruction as above -Cannot rule out hospital delirium however resolved at this point ANO x4   Constipation: -Likely in the setting of narcotics, continue to advance bowel regimen as tolerated.   -Abdominal x-ray shows nonobstructive bowel gas pattern and multiple right renal calculi.  Right renal calculi, incidentally noted: -Noted on X-ray, likely small nonobstructive given asymptomatic. Pt is afebrile, no leukocytosis, NO CVA tenderness, Renal function: WNL. Will hold of CT/renal US for now.  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 -Magnesium within normal limits   DVT prophylaxis: Per primary Code Status: Full Family Communication: None present  Status is: Observation  Dispo: The patient is from: Home              Anticipated d/c is to: Per primary              Anticipated d/c date is: Per primary              Patient currently is medically stable for discharge from our standpoint  Consultants:   We are  Antimicrobials:  Keflex x3 days (previously on Bactrim/Cipro/ceftriaxone)  Subjective: Issues or events, abdominal pain and "fullness" are much improved, denies nausea, vomiting, diarrhea, constipation, headache, fevers, chills.  Objective: Vitals:   01/05/20 0404  01/05/20 1035 01/05/20 1948 01/06/20 0413  BP: (!) 162/89 (!) 157/87 (!) 158/85 (!) 141/86  Pulse: 76 78 73 88  Resp: 18 15 18 19   Temp: (!) 97.4 F (36.3 C) 97.8 F (36.6 C) 98.2 F (36.8 C) 97.6 F (36.4 C)    TempSrc: Oral Oral Oral Oral  SpO2: 95% 95% 96% 100%  Weight:      Height:        Intake/Output Summary (Last 24 hours) at 01/06/2020 0833 Last data filed at 01/06/2020 0500 Gross per 24 hour  Intake 100 ml  Output 1850 ml  Net -1750 ml   Filed Weights   12/30/19 1017  Weight: (!) 105.7 kg    Examination:  General:  Pleasantly resting in bed, No acute distress. HEENT:  Normocephalic atraumatic.  Sclerae nonicteric, noninjected.  Extraocular movements intact bilaterally. Neck:  Without mass or deformity.  Trachea is midline. Lungs:  Clear to auscultate bilaterally without rhonchi, wheeze, or rales. Heart:  Regular rate and rhythm.  Without murmurs, rubs, or gallops. Abdomen:  Soft, minimally tender at the pubis, nondistended.  Without guarding or rebound. Extremities: Without cyanosis, clubbing, edema, or obvious deformity. Vascular:  Dorsalis pedis and posterior tibial pulses palpable bilaterally. Skin:  Warm and dry, no erythema, no ulcerations.  Data Reviewed: I have personally reviewed following labs and imaging studies  CBC: Recent Labs  Lab 01/05/20 1243  WBC 8.3  NEUTROABS 6.4  HGB 12.9*  HCT 38.3*  MCV 97.2  PLT 536   Basic Metabolic Panel: Recent Labs  Lab 01/05/20 1243 01/05/20 1445 01/06/20 0315  NA 142  --  143  K 5.4*  --  4.2  CL 103  --  105  CO2 30  --  29  GLUCOSE 152*  --  111*  BUN 18  --  18  CREATININE 1.13  --  0.96  CALCIUM 10.3  --  9.3  MG  --  2.1  --    GFR: Estimated Creatinine Clearance: 77.2 mL/min (by C-G formula based on SCr of 0.96 mg/dL). Liver Function Tests: Recent Labs  Lab 01/05/20 1243  AST 21  ALT 14  ALKPHOS 52  BILITOT 1.1  PROT 6.7  ALBUMIN 3.3*   No results for input(s): LIPASE, AMYLASE in the last 168 hours. No results for input(s): AMMONIA in the last 168 hours. Coagulation Profile: No results for input(s): INR, PROTIME in the last 168 hours. Cardiac Enzymes: No results for input(s): CKTOTAL, CKMB,  CKMBINDEX, TROPONINI in the last 168 hours. BNP (last 3 results) No results for input(s): PROBNP in the last 8760 hours. HbA1C: No results for input(s): HGBA1C in the last 72 hours. CBG: No results for input(s): GLUCAP in the last 168 hours. Lipid Profile: No results for input(s): CHOL, HDL, LDLCALC, TRIG, CHOLHDL, LDLDIRECT in the last 72 hours. Thyroid Function Tests: No results for input(s): TSH, T4TOTAL, FREET4, T3FREE, THYROIDAB in the last 72 hours. Anemia Panel: No results for input(s): VITAMINB12, FOLATE, FERRITIN, TIBC, IRON, RETICCTPCT in the last 72 hours. Sepsis Labs: Recent Labs  Lab 01/05/20 1243 01/05/20 1445  LATICACIDVEN 1.3 0.9    Recent Results (from the past 240 hour(s))  Urine culture     Status: None   Collection Time: 12/27/19  2:40 PM   Specimen: Urine, Clean Catch  Result Value Ref Range Status   Specimen Description URINE, CLEAN CATCH  Final   Special Requests NONE  Final   Culture   Final    NO  GROWTH Performed at [facility], [place] [address] [place], [place] [address]    Report Status 12/28/2019 FINAL  Final  Surgical pcr screen     Status: Abnormal   Collection Time: 12/27/19  2:41 PM   Specimen: Nasal Mucosa; Nasal Swab  Result Value Ref Range Status   MRSA, PCR NEGATIVE NEGATIVE Final   Staphylococcus aureus POSITIVE (A) NEGATIVE Final    Comment: (NOTE) The Xpert SA Assay (FDA approved for NASAL specimens in patients 71 years of age and older), is one component of a comprehensive surveillance program. It is not intended to diagnose infection nor to guide or monitor treatment. Performed at [facility], [place] [address], [place], [place] [address]   SARS CORONAVIRUS 2 (TAT 6-24 HRS) Nasopharyngeal Nasopharyngeal Swab     Status: None   Collection Time: 12/27/19  3:19 PM   Specimen: Nasopharyngeal Swab  Result Value Ref Range Status   SARS Coronavirus 2 NEGATIVE NEGATIVE Final    Comment: (NOTE) SARS-CoV-2 target nucleic  acids are NOT DETECTED.  The SARS-CoV-2 RNA is generally detectable in upper and lower respiratory specimens during the acute phase of infection. Negative results do not preclude SARS-CoV-2 infection, do not rule out co-infections with other pathogens, and should not be used as the sole basis for treatment or other patient management decisions. Negative results must be combined with clinical observations, patient history, and epidemiological information. The expected result is Negative.  Fact Sheet for Patients: SugarRoll.be  Fact Sheet for Healthcare Providers: https://www.woods-mathews.com/  This test is not yet approved or cleared by the [place] FDA and  has been authorized for detection and/or diagnosis of SARS-CoV-2 by FDA under an Emergency Use Authorization (EUA). This EUA will remain  in effect (meaning this test can be used) for the duration of the COVID-19 declaration under Se ction 564(b)(1) of the Act, 21 U.S.C. section 360bbb-3(b)(1), unless the authorization is terminated or revoked sooner.  Performed at [facility], [place] [address] [place], [place] [address]   SARS CORONAVIRUS 2 (TAT 6-24 HRS) Nasopharyngeal Nasopharyngeal Swab     Status: None   Collection Time: 01/02/20  3:46 PM   Specimen: Nasopharyngeal Swab  Result Value Ref Range Status   SARS Coronavirus 2 NEGATIVE NEGATIVE Final    Comment: (NOTE) SARS-CoV-2 target nucleic acids are NOT DETECTED.  The SARS-CoV-2 RNA is generally detectable in upper and lower respiratory specimens during the acute phase of infection. Negative results do not preclude SARS-CoV-2 infection, do not rule out co-infections with other pathogens, and should not be used as the sole basis for treatment or other patient management decisions. Negative results must be combined with clinical observations, patient history, and epidemiological information. The expected result is  Negative.  Fact Sheet for Patients: SugarRoll.be  Fact Sheet for Healthcare Providers: https://www.woods-mathews.com/  This test is not yet approved or cleared by the [place] FDA and  has been authorized for detection and/or diagnosis of SARS-CoV-2 by FDA under an Emergency Use Authorization (EUA). This EUA will remain  in effect (meaning this test can be used) for the duration of the COVID-19 declaration under Se ction 564(b)(1) of the Act, 21 U.S.C. section 360bbb-3(b)(1), unless the authorization is terminated or revoked sooner.  Performed at [facility], [place] [address] [place], [place] [address]     Radiology Studies: DG Abd 2 Views  Result Date: 01/05/2020 CLINICAL DATA:  Small-bowel obstruction, history bladder cancer EXAM: ABDOMEN - 2 VIEW  COMPARISON:  09/26/2014 FINDINGS: Lung bases clear. Scattered stool throughout colon. No bowel dilatation or bowel wall thickening. Multiple RIGHT renal calculi, largest at inferior pole 18 x 8 mm. 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 IMPRESSION: Nonobstructive bowel gas pattern. Multiple RIGHT renal calculi. Electronically Signed   By: Lavonia Dana M.D.   On: 01/05/2020 14:01   Scheduled Meds: . aspirin EC  81 mg Oral Daily  . docusate sodium  100 mg Oral BID  . doxazosin  2 mg Oral QHS  . mupirocin ointment   Nasal BID  . senna-docusate  1 tablet Oral BID  . sertraline  25 mg Oral QHS  . verapamil  120 mg Oral QHS   Continuous Infusions: . cefTRIAXone (ROCEPHIN)  IV 1 g (01/05/20 1600)  . lactated ringers 75 mL/hr at 12/31/19 0446     LOS: 0 days   Time spent: 96min  Torben Soloway C Yarieliz Wasser, DO [facility]  If 7PM-7AM, please contact night-coverage [email or web address]  01/06/2020, 8:33 AM

## 2020-01-06 NOTE — Discharge Summary (Addendum)
Physician Discharge Summary      Patient ID: Richard King MRN: [id number] DOB/AGE: 06-09-1940 79 y.o.  Admit date: 12/30/2019 Discharge date: 01/06/2020  Admission Diagnoses:  Active Problems:   Essential hypertension   BPH (benign prostatic hyperplasia)   Lower urinary tract symptoms (LUTS)   S/P reverse total shoulder arthroplasty, left   Constipation   Discharge Diagnoses:  Same  Surgeries: Procedure(s): LEFT REVERSE SHOULDER ARTHROPLASTY on 12/30/2019   Consultants: Treatment Team:  Mckinley Jewel, MD Little Ishikawa, MD  Discharged Condition: [facility] Course: Richard King is an 79 y.o. 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  They were brought to the operating room on 12/30/2019 and underwent the above named procedures.  Pt awoke from anesthesia without complication and was transferred to the floor. On POD1, patient's pain was controlled. He did well throughout his stay and mobilized with PT but PT recommended SNF. Daughters also recommended SNF due to patient's social situation. Prior to discharge, patient was noted to have some abdominal pain and urinary tract infection symptoms.  Hospitalist team consulted and abdominal XRs were negative for bowel obstruction. Plan to discharge SNF on 01/06/2020 with PO Keflex for treatment of UTI.  Pt will f/u with Dr. Marlou Sa in clinic 2 weeks postop. Recommend follow-up with Urology next week for evaluation of urinary retention and foley removal.   Antibiotics given:  Anti-infectives (From admission, onward)   Start     Dose/Rate Route Frequency Ordered Stop   01/06/20 0000  cephALEXin (KEFLEX) 250 MG capsule     Discontinue     250 mg Oral 4 times daily 01/06/20 1036 01/09/20 2359   01/05/20 1500  cefTRIAXone (ROCEPHIN) 1 g in sodium chloride 0.9 % 100 mL IVPB     Discontinue     1 g 200 mL/hr over 30 Minutes Intravenous Every 24 hours  01/05/20 1404     01/05/20 1000  sulfamethoxazole-trimethoprim (BACTRIM DS) 800-160 MG per tablet 1 tablet  Status:  Discontinued        1 tablet Oral Every 12 hours 01/05/20 0917 01/05/20 1404   01/04/20 1600  ciprofloxacin (CIPRO) IVPB 200 mg        200 mg 100 mL/hr over 60 Minutes Intravenous  Once 01/04/20 1525 01/05/20 1038   01/04/20 0000  sulfamethoxazole-trimethoprim (BACTRIM DS) 800-160 MG tablet     Discontinue     1 tablet Oral 2 times daily 01/04/20 1525     12/30/19 2000  ceFAZolin (ANCEF) IVPB 2g/100 mL premix        2 g 200 mL/hr over 30 Minutes Intravenous Every 6 hours 12/30/19 1819 12/31/19 0924   12/30/19 1200  ceFAZolin (ANCEF) IVPB 2g/100 mL premix        2 g 200 mL/hr over 30 Minutes Intravenous On call to O.R. 12/30/19 1021 12/30/19 1447    .  Recent vital signs:  Vitals:   01/06/20 0413 01/06/20 0839  BP: (!) 141/86 133/70  Pulse: 88 65  Resp: 19 17  Temp: 97.6 F (36.4 C) 97.8 F (36.6 C)  SpO2: 100% 96%    Recent laboratory studies:  Results for orders placed or performed during the hospital encounter of 12/30/19  SARS CORONAVIRUS 2 (TAT 6-24 HRS) Nasopharyngeal Nasopharyngeal Swab   Specimen: Nasopharyngeal Swab  Result Value Ref Range   SARS Coronavirus 2 NEGATIVE NEGATIVE  Culture, Urine  Specimen: Urine, Random  Result Value Ref Range   Specimen Description URINE, RANDOM    Special Requests NONE    Culture      NO GROWTH Performed at [facility], [address], [place], [place] [address]    Report Status 01/06/2020 FINAL   Urinalysis, Routine w reflex microscopic  Result Value Ref Range   Color, Urine YELLOW YELLOW   APPearance HAZY (A) CLEAR   Specific Gravity, Urine 1.024 1.005 - 1.030   pH 5.0 5.0 - 8.0   Glucose, UA NEGATIVE NEGATIVE mg/dL   Hgb urine dipstick LARGE (A) NEGATIVE   Bilirubin Urine NEGATIVE NEGATIVE   Ketones, ur NEGATIVE NEGATIVE mg/dL   Protein, ur 30 (A) NEGATIVE mg/dL   Nitrite NEGATIVE NEGATIVE    Leukocytes,Ua SMALL (A) NEGATIVE   RBC / HPF >50 (H) 0 - 5 RBC/hpf   WBC, UA 21-50 0 - 5 WBC/hpf   Bacteria, UA NONE SEEN NONE SEEN   Squamous Epithelial / LPF 0-5 0 - 5   Mucus PRESENT    Hyaline Casts, UA PRESENT   CBC with Differential/Platelet  Result Value Ref Range   WBC 8.3 4.0 - 10.5 K/uL   RBC 3.94 (L) 4.22 - 5.81 MIL/uL   Hemoglobin 12.9 (L) 13.0 - 17.0 g/dL   HCT 38.3 (L) 39 - 52 %   MCV 97.2 80.0 - 100.0 fL   MCH 32.7 26.0 - 34.0 pg   MCHC 33.7 30.0 - 36.0 g/dL   RDW 13.1 11.5 - 15.5 %   Platelets 266 150 - 400 K/uL   nRBC 0.0 0.0 - 0.2 %   Neutrophils Relative % 77 %   Neutro Abs 6.4 1.7 - 7.7 K/uL   Lymphocytes Relative 7 %   Lymphs Abs 0.6 (L) 0.7 - 4.0 K/uL   Monocytes Relative 13 %   Monocytes Absolute 1.0 0 - 1 K/uL   Eosinophils Relative 2 %   Eosinophils Absolute 0.2 0 - 0 K/uL   Basophils Relative 1 %   Basophils Absolute 0.0 0 - 0 K/uL   Immature Granulocytes 0 %   Abs Immature Granulocytes 0.03 0.00 - 0.07 K/uL  Comprehensive metabolic panel  Result Value Ref Range   Sodium 142 135 - 145 mmol/L   Potassium 5.4 (H) 3.5 - 5.1 mmol/L   Chloride 103 98 - 111 mmol/L   CO2 30 22 - 32 mmol/L   Glucose, Bld 152 (H) 70 - 99 mg/dL   BUN 18 8 - 23 mg/dL   Creatinine, Ser 1.13 0.61 - 1.24 mg/dL   Calcium 10.3 8.9 - 10.3 mg/dL   Total Protein 6.7 6.5 - 8.1 g/dL   Albumin 3.3 (L) 3.5 - 5.0 g/dL   AST 21 15 - 41 U/L   ALT 14 0 - 44 U/L   Alkaline Phosphatase 52 38 - 126 U/L   Total Bilirubin 1.1 0.3 - 1.2 mg/dL   GFR calc non Af Amer >60 >60 mL/min   GFR calc Af Amer >60 >60 mL/min   Anion gap 9 5 - 15  Lactic acid, plasma  Result Value Ref Range   Lactic Acid, Venous 1.3 0.5 - 1.9 mmol/L  Lactic acid, plasma  Result Value Ref Range   Lactic Acid, Venous 0.9 0.5 - 1.9 mmol/L  Magnesium  Result Value Ref Range   Magnesium 2.1 1.7 - 2.4 mg/dL  Basic metabolic panel  Result Value Ref Range   Sodium 143 135 - 145 mmol/L  Potassium 4.2 3.5 - 5.1  mmol/L   Chloride 105 98 - 111 mmol/L   CO2 29 22 - 32 mmol/L   Glucose, Bld 111 (H) 70 - 99 mg/dL   BUN 18 8 - 23 mg/dL   Creatinine, Ser 0.96 0.61 - 1.24 mg/dL   Calcium 9.3 8.9 - 10.3 mg/dL   GFR calc non Af Amer >60 >60 mL/min   GFR calc Af Amer >60 >60 mL/min   Anion gap 9 5 - 15    Discharge Medications:   Allergies as of 01/06/2020      Reactions   Ivp Dye [iodinated Diagnostic Agents] Swelling   Codeine    insomnia      Medication List    TAKE these medications   aspirin 81 MG EC tablet Take 1 tablet (81 mg total) by mouth daily. Swallow whole.   cephALEXin 250 MG capsule Commonly known as: KEFLEX Take 1 capsule (250 mg total) by mouth 4 (four) times daily for 3 days.   cyanocobalamin 1000 MCG/ML injection Commonly known as: (VITAMIN B-12) 1068mcg IM every 14 days   doxazosin 1 MG tablet Commonly known as: CARDURA Take 1-2 tablets (1-2 mg total) by mouth daily. What changed:   how much to take  when to take this   methocarbamol 500 MG tablet Commonly known as: ROBAXIN Take 1 tablet (500 mg total) by mouth every 6 (six) hours as needed for muscle spasms.   sertraline 25 MG tablet Commonly known as: ZOLOFT TAKE 1 TABLET BY MOUTH DAILY. What changed: when to take this   traMADol 50 MG tablet Commonly known as: ULTRAM Take 2 tablets (100 mg total) by mouth every 6 (six) hours as needed for moderate pain. What changed:   how much to take  reasons to take this   verapamil 120 MG CR tablet Commonly known as: CALAN-SR TAKE 1 TABLET BY MOUTH AT BEDTIME.       Diagnostic Studies: DG Shoulder Left Port  Result Date: 12/30/2019 CLINICAL DATA:  Status post shoulder surgery. EXAM: LEFT SHOULDER COMPARISON:  Preoperative shoulder CT and radiograph. FINDINGS: Reverse left shoulder arthroplasty. Glenoid and humeral components are congruent. Glenoid component appears inferiorly situated, possibly related to positioning. Recent postsurgical change includes air  and edema in the joint space. IMPRESSION: 1. Recent reverse left shoulder arthroplasty. 2. Glenoid component appears inferiorly situated, possibly related to positioning. Electronically Signed   By: Keith Rake M.D.   On: 12/30/2019 18:28   DG Abd 2 Views  Result Date: 01/05/2020 CLINICAL DATA:  Small-bowel obstruction, history bladder cancer EXAM: ABDOMEN - 2 VIEW COMPARISON:  09/26/2014 FINDINGS: Lung bases clear. Scattered stool throughout colon. No bowel dilatation or bowel wall thickening. Multiple RIGHT renal calculi, largest at inferior pole 18 x 8 mm. 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 IMPRESSION: Nonobstructive bowel gas pattern. Multiple RIGHT renal calculi. Electronically Signed   By: Lavonia Dana M.D.   On: 01/05/2020 14:01    Disposition:   Discharge Instructions    Call MD / Call 911   Complete by: As directed    If you experience chest pain or shortness of breath, CALL 911 and be transported to the hospital emergency room.  If you develope a fever above 101 F, pus (white drainage) or increased drainage or redness at the wound, or calf pain, call your surgeon's office.   Call MD / Call 911   Complete by: As directed    If you experience chest  pain or shortness of breath, CALL 911 and be transported to the hospital emergency room.  If you develope a fever above 101 F, pus (white drainage) or increased drainage or redness at the wound, or calf pain, call your surgeon's office.   Constipation Prevention   Complete by: As directed    Drink plenty of fluids.  Prune juice may be helpful.  You may use a stool softener, such as Colace (over the counter) 100 mg twice a day.  Use MiraLax (over the counter) for constipation as needed.   Constipation Prevention   Complete by: As directed    Drink plenty of fluids.  Prune juice may be helpful.  You may use a stool softener, such as Colace (over the counter) 100 mg twice a day.  Use MiraLax (over the  counter) for constipation as needed.   Diet - low sodium heart healthy   Complete by: As directed    Diet - low sodium heart healthy   Complete by: As directed    Discharge instructions   Complete by: As directed    You may shower, dressing is waterproof.  Do not bathe or soak the operative shoulder in a tub, pool.  Use the CPM machine 3 times a day for one hour each time.  No lifting with the operative shoulder. Continue use of the sling.  Follow-up with Dr. Marlou Sa in ~2 weeks on your given appointment date.  We will remove your adhesive bandage at that time.    Dental Antibiotics:  In most cases prophylactic antibiotics for Dental procdeures after total joint surgery are not necessary.  Exceptions are as follows:  1. History of prior total joint infection  2. Severely immunocompromised (Organ Transplant, cancer chemotherapy, Rheumatoid biologic meds such as Clark)  3. Poorly controlled diabetes (A1C &gt; 8.0, blood glucose over 200)  If you have one of these conditions, contact your surgeon for an antibiotic prescription, prior to your dental procedure.   Increase activity slowly as tolerated   Complete by: As directed    Increase activity slowly as tolerated   Complete by: As directed        Contact information for after-discharge care    [facility] .   Service: Skilled Nursing Contact information: [place] [phone]                   Signed: Donella Stade 01/06/2020, 12:35 PM

## 2020-01-06 NOTE — Progress Notes (Addendum)
Patient stable.  Shoulder pain controlled.  On tramadol for pain control but not having to take it regularly.  Appreciate hospitalist evaluation.  Plan for discharge to SNF today.  He will have bladder scan later today and possible foley catheter placement if he is unable to spontaneously void.  Continue PO Keflex once discharged to SNF for treatment of UTI.  Follow-up with Dr. Marlou Sa 2 weeks postop.

## 2020-01-06 NOTE — Progress Notes (Signed)
NCM received extention for SNF authorization from [facility], start 8/5-8/9. Please fax clinicals to [phone]. [place] ID [id number]. Whitman Hero RN,BSN,CM

## 2020-01-06 NOTE — Plan of Care (Signed)

## 2020-01-06 NOTE — Progress Notes (Signed)
Pt dressed and ready for d/c, belongings gathered, paperwork in chart for PTAR and report called. Family notified and pt agreeable. Will continue to monitor untill PTAR arrival.

## 2020-01-06 NOTE — TOC Transition Note (Signed)
Transition of Care [facility]) - CM/SW Discharge Note   Patient Details  Name: Richard King MRN: [id number] Date of Birth: 05/04/1941  [facility]) CM/SW Contact:  Sharin Mons, RN Phone Number: 01/06/2020, 3:11 PM   Clinical Narrative:     Patient will DC to: [facility] Anticipated DC date: 01/06/2020 Family notified: Rodena Piety ( daughter) Transport by: Corey Harold   Per MD patient ready for DC today. RN, patient, patient's family, and facility notified of DC. Discharge Summary and FL2 sent to facility. RN to call report prior to discharge [phone]). DC packet on chart. Ambulance transport requested for patient.   RNCM will sign off for now as intervention is no longer needed. Please consult [place] again if new needs arise.   Final next level of care: Skilled Nursing Facility Barriers to Discharge: No Barriers Identified   Patient Goals and CMS Choice Patient states their goals for this hospitalization and ongoing recovery are:: Rehab CMS Medicare.gov Compare Post Acute Care list provided to:: Patient Choice offered to / list presented to : Patient  Discharge Placement                       Discharge Plan and Services In-house Referral: Clinical Social Work   Post Acute Care Choice: [place]                               Social Determinants of Health (SDOH) Interventions     Readmission Risk Interventions No flowsheet data found.

## 2020-01-06 NOTE — Progress Notes (Signed)
Patient stable In shoulder sling and comfortable Appreciate medical consultation and input for management of medical problems Plan discharge today to skilled nursing Has Foley and will take 3 more days of oral antibiotics for UTI Advised him to take the minimum amount of pain medicine necessary for him to move the shoulder Follow-up in 11 days

## 2020-01-07 DIAGNOSIS — Z8659 Personal history of other mental and behavioral disorders: Secondary | ICD-10-CM | POA: Diagnosis not present

## 2020-01-07 DIAGNOSIS — R339 Retention of urine, unspecified: Secondary | ICD-10-CM | POA: Diagnosis not present

## 2020-01-07 DIAGNOSIS — M19012 Primary osteoarthritis, left shoulder: Secondary | ICD-10-CM | POA: Diagnosis not present

## 2020-01-10 ENCOUNTER — Telehealth: Payer: Self-pay | Admitting: Orthopedic Surgery

## 2020-01-10 NOTE — Telephone Encounter (Signed)
Patient called advised he is suppose to go home today and need to know what does he need to do as far and caring for him shoulder? Patient need to know what exercises he should be doing?The number to contact patient [phone]

## 2020-01-10 NOTE — Telephone Encounter (Signed)
I called and sw pt to advise of message below. Voiced understanding and will call with any questions.

## 2020-01-10 NOTE — Telephone Encounter (Signed)
Pendulum exercises.  Okay to shower because dressing is waterproof.  No lifting with affected arm.  If he has a CPM or black brace it is fine to use that for passive motion also.

## 2020-01-10 NOTE — Telephone Encounter (Signed)
Pls advise.  

## 2020-01-12 DIAGNOSIS — N200 Calculus of kidney: Secondary | ICD-10-CM | POA: Diagnosis not present

## 2020-01-12 DIAGNOSIS — Z8551 Personal history of malignant neoplasm of bladder: Secondary | ICD-10-CM | POA: Diagnosis not present

## 2020-01-12 DIAGNOSIS — R338 Other retention of urine: Secondary | ICD-10-CM | POA: Diagnosis not present

## 2020-01-13 ENCOUNTER — Telehealth: Payer: Self-pay

## 2020-01-13 NOTE — Telephone Encounter (Signed)
Okay from my perspective to be off antibiotics.  Just make sure that the Foley catheter is out.  If the Foley catheter is in he should probably still be on antibiotics.

## 2020-01-13 NOTE — Telephone Encounter (Signed)
Please advise. Thanks.  

## 2020-01-13 NOTE — Telephone Encounter (Signed)
Patient daughter tammy called in wanting to know shoukd patient still take antibodies or not ?

## 2020-01-13 NOTE — Telephone Encounter (Signed)
IC and advised per below. Daughter would like to know which abx should he take? Lurena Joiner put patient on Septra and Dr Avon Gully put patient on Keflex

## 2020-01-14 ENCOUNTER — Telehealth: Payer: Self-pay | Admitting: Orthopaedic Surgery

## 2020-01-14 ENCOUNTER — Inpatient Hospital Stay: Payer: Medicare PPO | Admitting: Orthopedic Surgery

## 2020-01-14 ENCOUNTER — Other Ambulatory Visit: Payer: Self-pay | Admitting: Surgical

## 2020-01-14 MED ORDER — CEPHALEXIN 250 MG PO CAPS: 250.0000 mg | ORAL_CAPSULE | Freq: Four times a day (QID) | ORAL | 0 refills | Status: AC

## 2020-01-14 NOTE — Telephone Encounter (Signed)
Patient's daughter called.   Said they need a refill on the patient's antibiotics .   Call back: [phone]

## 2020-01-14 NOTE — Telephone Encounter (Signed)
I left voicemail for Tammy advising.

## 2020-01-14 NOTE — Telephone Encounter (Signed)
Submitted

## 2020-01-14 NOTE — Telephone Encounter (Signed)
Keflex ok til foley out thx then no abx

## 2020-01-14 NOTE — Telephone Encounter (Signed)
Patient needs refill on Keflex. Per last phone message from Dr. Marlou Sa, he wanted patient to continue Keflex until foley removed. Per daughter [name], patient completely out of antibiotics and still has catheter.  Please send Keflex to [facility].

## 2020-01-14 NOTE — Telephone Encounter (Signed)
I called patient's daughter and advised. 

## 2020-01-17 ENCOUNTER — Ambulatory Visit (INDEPENDENT_AMBULATORY_CARE_PROVIDER_SITE_OTHER): Payer: Medicare PPO | Admitting: Orthopedic Surgery

## 2020-01-17 ENCOUNTER — Ambulatory Visit (INDEPENDENT_AMBULATORY_CARE_PROVIDER_SITE_OTHER): Payer: Medicare PPO

## 2020-01-17 DIAGNOSIS — Z96612 Presence of left artificial shoulder joint: Secondary | ICD-10-CM

## 2020-01-18 ENCOUNTER — Encounter: Payer: Self-pay | Admitting: Orthopedic Surgery

## 2020-01-18 NOTE — Progress Notes (Signed)
Post-Op Visit Note   Patient: Richard King           Date of Birth: September 18, 1940           MRN: [id number] Visit Date: 01/17/2020 PCP: Richard Ghent, MD   Assessment & Plan:  Chief Complaint:  Chief Complaint  Patient presents with  . Left Shoulder - Routine Post Op   Visit Diagnoses:  1. History of arthroplasty of left shoulder     Plan: Richard King is a patient is now 2 weeks out left reverse shoulder replacement.  Doing well.  Reports occasional aching pain.  Still has his Foley in.  He is on antibiotics with the Foley in place.  On examination deltoid fires.  Passive range of motion is improving.  Radiographs look good.  Plan is to start outpatient physical therapy for passive range of motion and active assisted range of motion as tolerated.  Discussed with him 15 pound lifting limit lifetime but for now until we get solid bony ingrowth 5 pound lifting limit for the next 4 weeks.  Come back in 4 weeks for clinical recheck.  Incision is intact.  Follow-Up Instructions: Return in about 4 weeks (around 02/14/2020).   Orders:  Orders Placed This Encounter  Procedures  . XR Shoulder Left  . Ambulatory referral to Physical Therapy   No orders of the defined types were placed in this encounter.   Imaging: No results found.  PMFS History: Patient Active Problem List   Diagnosis Date Noted  . Constipation 01/05/2020  . 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 Primary osteoarthritis, left shoulder 10/12/2019  . Rash 03/31/2019  . Lower urinary tract symptoms (LUTS) 08/27/2018  . BPPV (benign paroxysmal positional vertigo), right 12/24/2017  . B12 deficiency   . Ataxic gait 10/12/2017  . Caregiver role strain 11/13/2015  . 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 Knee pain 05/18/2015  . Nasal congestion 05/18/2015  . Skin lesion 03/30/2015  . Advance care planning 11/05/2014  . 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 Lower abdominal pain 09/04/2014  . ED  (erectile dysfunction) 11/06/2011  . Vertigo 05/17/2011  . Medicare annual wellness visit, subsequent 05/10/2011  . Bladder cancer ([place])   . Essential hypertension 09/07/2008  . BPH (benign prostatic hyperplasia) 09/07/2008  . COLONIC POLYPS, HX OF 09/07/2008  . NEPHROLITHIASIS, HX OF 09/07/2008   Past Medical History:  Diagnosis Date  . Arthritis   . B12 deficiency   . Bladder cancer ([place])   . BPH (benign prostatic hypertrophy)   . ED (erectile dysfunction)   . History of kidney stones   . History of nephrolithiasis   . Hx of colonic polyps   . Hypertension   . Pneumonia    as a child    Family History  Problem Relation Age of Onset  . Hypertension Father   . AAA (abdominal aortic aneurysm) Father   . Stroke Sister   . Cancer Neg Hx   . Diabetes Neg Hx   . Colon cancer Neg Hx   . Prostate cancer Neg Hx     Past Surgical History:  Procedure Laterality Date  . bladder cancer shaving     hx bladder cancer  . CATARACT EXTRACTION    . COLONOSCOPY    . LITHOTRIPSY     per Dr. Risa King  . POLYPECTOMY  2009   HPP  . REVERSE SHOULDER ARTHROPLASTY Left 12/30/2019   Procedure: LEFT REVERSE SHOULDER ARTHROPLASTY;  Surgeon: Richard Duos  Nicki Reaper, MD;  Location: [facility];  Service: Orthopedics;  Laterality: Left;  . 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 Smoking status: Former Smoker    Quit date: 06/03/1976    Years since quitting: 43.6  . Smokeless tobacco: Never Used  Vaping Use  . Vaping Use: Never used  Substance and Sexual Activity  . Alcohol use: No    Alcohol/week: 0.0 standard drinks  . Drug use: No  . Sexual activity: Yes

## 2020-01-19 ENCOUNTER — Ambulatory Visit: Payer: Medicare PPO | Admitting: Physical Therapy

## 2020-01-19 ENCOUNTER — Encounter: Payer: Self-pay | Admitting: Physical Therapy

## 2020-01-19 ENCOUNTER — Other Ambulatory Visit: Payer: Self-pay

## 2020-01-19 DIAGNOSIS — G8929 Other chronic pain: Secondary | ICD-10-CM

## 2020-01-19 DIAGNOSIS — R293 Abnormal posture: Secondary | ICD-10-CM

## 2020-01-19 DIAGNOSIS — R600 Localized edema: Secondary | ICD-10-CM

## 2020-01-19 DIAGNOSIS — M6281 Muscle weakness (generalized): Secondary | ICD-10-CM | POA: Diagnosis not present

## 2020-01-19 DIAGNOSIS — M25512 Pain in left shoulder: Secondary | ICD-10-CM | POA: Diagnosis not present

## 2020-01-19 NOTE — Therapy (Signed)
[place], [place], [address] Phone: [phone]   Fax:  [phone]  Physical Therapy Evaluation  Patient Details  Name: Richard King MRN: [id number] Date of Birth: [birth year] Referring Provider (PT): Dr. Marlou Sa   Encounter Date: 01/19/2020   PT End of Session - 01/19/20 0842    Visit Number 1    Number of Visits 12    Date for PT Re-Evaluation 03/08/20    Authorization Type [employer]    Progress Note Due on Visit 10    PT Start Time 0804    PT Stop Time 0845    PT Time Calculation (min) 41 min    Activity Tolerance Patient tolerated treatment well    Behavior During Therapy [facility] for tasks assessed/performed           Past Medical History:  Diagnosis Date  . Arthritis   . B12 deficiency   . Bladder cancer ([place])   . BPH (benign prostatic hypertrophy)   . ED (erectile dysfunction)   . History of kidney stones   . History of nephrolithiasis   . Hx of colonic polyps   . Hypertension   . Pneumonia    as a child    Past Surgical History:  Procedure Laterality Date  . bladder cancer shaving     hx bladder cancer  . CATARACT EXTRACTION    . COLONOSCOPY    . LITHOTRIPSY     per Dr. Risa Grill  . POLYPECTOMY  2009   HPP  . REVERSE SHOULDER ARTHROPLASTY Left 12/30/2019   Procedure: LEFT REVERSE SHOULDER ARTHROPLASTY;  Surgeon: Meredith Pel, MD;  Location: [place];  Service: Orthopedics;  Laterality: Left;  . 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    Subjective Assessment - 01/19/20 0807    Subjective He had Lt rTSH due to OA on 12/30/19. Relays overall pain is low but he hasnt done much with his arm. He says it itches. He says he sleeps sitting up for last few months due to vertigo.    Limitations Lifting    Currently in Pain? Yes    Pain Score 3     Pain Location Shoulder    Pain Orientation Left    Pain  Descriptors / Indicators Aching    Pain Onset More than a month ago    Aggravating Factors  raising it up    Pain Relieving Factors laying a towel over his shoulder    Multiple Pain Sites No              [facility] PT Assessment - 01/19/20 0001      Assessment   Medical Diagnosis S/P Lt rTSA 12/30/19    Referring Provider (PT) Dr. Lolly Mustache Dominance Right      Precautions   Precaution Comments MD recommending PROM and AAROM as tolerated 1-2x/wk for 6wks. MD also states in note "15 pound lifting limit lifetime but for now until we get solid bony ingrowth 5 pound lifting limit for the next 4 weeks"      Balance Screen   Has the patient fallen in the past 6 months No    Has the patient had a decrease in activity level because of a fear of falling?  No    Is the patient reluctant to leave their home because of a fear of falling?  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      Prior Function   Level of Independence Independent    Vocation Retired      Associate Professor   Overall Cognitive Status Within Functional Limits for tasks assessed      Observation/Other Assessments   Observations hard of hearing      Sensation   Light Touch Appears Intact      Posture/Postural Control   Postural Limitations Rounded Shoulders;Forward head      AROM   Overall AROM Comments In sitting    Left Shoulder Flexion 35 Degrees    Left Shoulder ABduction 40 Degrees      PROM   Left Shoulder Flexion 110 Degrees    Left Shoulder ABduction 100 Degrees    Left Shoulder Internal Rotation 30 Degrees    Left Shoulder External Rotation 20 Degrees      Strength   Right Shoulder Flexion 2/5    Right Shoulder ABduction 2/5    Right Shoulder Internal Rotation 3/5    Right Shoulder External Rotation 3/5                      Objective measurements completed on examination: See above findings.       [facility] Adult  PT Treatment/Exercise - 01/19/20 0001      Exercises   Exercises Shoulder      Shoulder Exercises: Supine   External Rotation Left;AAROM;10 reps    Flexion AAROM;10 reps;Left    ABduction Left;AAROM;10 reps      Shoulder Exercises: Standing   Retraction 10 reps    Other Standing Exercises pendulums X 10 reps ea plane    Other Standing Exercises Isometrics 5 sec X 10 reps for flexion, IR,ER      Shoulder Exercises: Pulleys   Flexion 2 minutes    Scaption 2 minutes      Manual Therapy   Manual therapy comments Lt shoulder PROM gentle to tolerance                  PT Education - 01/19/20 0842    Education Details HEP, POC    Person(s) Educated Patient    Methods Explanation;Demonstration;Verbal cues;Handout    Comprehension Verbalized understanding;Returned demonstration;Need further instruction               PT Long Term Goals - 01/19/20 5573      PT LONG TERM GOAL #1   Title Patient will demonstrate/report pain at worst less than or equal to 3/10 to facilitate minimal limitation in daily activity secondary to pain symptoms.    Time 6    Period Weeks    Status New    Target Date 03/08/20      PT LONG TERM GOAL #2   Title Patient will demonstrate independent use of home exercise program to facilitate ability to maintain/progress functional gains from skilled physical therapy services.    Time 6    Period Weeks    Status New      PT LONG TERM GOAL #3   Title Patient will demonstrate Lt [place] joint mobility and AROM WFL to facilitate usual self care, dressing, reaching overhead at PLOF s limitation due to symptoms.    Time 6    Period Weeks    Status New      PT LONG TERM GOAL #4   Title Patient will demonstrate Lt UE MMT  4/5 throughout to facilitate usual lifting, carrying in functional activity to PLOF s limitation.    Time 6    Period Weeks    Status New      PT LONG TERM GOAL #5   Title -                  Plan - 01/19/20 0846     Clinical Impression Statement He presents with Lt shoulder pain, stiffness, and weakness S/P Lt rTSA 12/30/19. He will benefit from skilled PT to address his funcitonal deficits. MD recommending PROM and AAROM as tolerated 1-2x/wk for 6wks. MD also states in note "15 pound lifting limit lifetime but for now until we get solid bony in growth 5 pound lifting limit for the next 4 weeks"    Personal Factors and Comorbidities Comorbidity 1    Comorbidities HTN, OA    Examination-Activity Limitations Bathing;Caring for Others;Carry;Dressing;Lift;Reach Overhead;Sleep    Examination-Participation Restrictions Other;Laundry;Driving;Cleaning   household activity   Stability/Clinical Decision Making Stable/Uncomplicated    Clinical Decision Making Low    Rehab Potential Good    PT Frequency 2x / week    PT Duration 6 weeks    PT Treatment/Interventions ADLs/Self Care Home Management;Cryotherapy;Electrical Stimulation;Iontophoresis 4mg /ml Dexamethasone;Moist Heat;Ultrasound;Functional mobility training;Therapeutic activities;Therapeutic exercise;Traction;Balance training;Neuromuscular re-education;Passive range of motion;Dry needling;Patient/family education;Taping;Vasopneumatic Device;Joint Manipulations;Spinal Manipulations    PT Next Visit Plan HEP review, gentle AAROM, isometrics, PROM    PT Home Exercise Plan [email or web address]    Consulted and Agree with Plan of Care Patient           Patient will benefit from skilled therapeutic intervention in order to improve the following deficits and impairments:  Decreased endurance, Hypomobility, Pain, Impaired UE functional use, Decreased strength, Decreased activity tolerance, Decreased mobility, Decreased range of motion, Impaired flexibility, Postural dysfunction  Visit Diagnosis: Chronic left shoulder pain  Muscle weakness (generalized)  Abnormal posture  Localized edema     Problem List Patient Active Problem List   Diagnosis Date Noted  .  Constipation 01/05/2020  . 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 Primary osteoarthritis, left shoulder 10/12/2019  . Rash 03/31/2019  . Lower urinary tract symptoms (LUTS) 08/27/2018  . BPPV (benign paroxysmal positional vertigo), right 12/24/2017  . B12 deficiency   . Ataxic gait 10/12/2017  . Caregiver role strain 11/13/2015  . 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 Knee pain 05/18/2015  . Nasal congestion 05/18/2015  . Skin lesion 03/30/2015  . Advance care planning 11/05/2014  . 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 Lower abdominal pain 09/04/2014  . ED (erectile dysfunction) 11/06/2011  . Vertigo 05/17/2011  . Medicare annual wellness visit, subsequent 05/10/2011  . Bladder cancer ([place])   . Essential hypertension 09/07/2008  . BPH (benign prostatic hyperplasia) 09/07/2008  . COLONIC POLYPS, HX OF 09/07/2008  . NEPHROLITHIASIS, HX OF 09/07/2008    Silvestre Mesi 01/19/2020, 8:54 AM  [facility] Physical Therapy [address] [place], [place], [address] Phone: [phone]   Fax:  [phone]  Name: ANDRICK RUST MRN: [id number] Date of Birth: [birth year]

## 2020-01-19 NOTE — Patient Instructions (Signed)
Access Code: [email or web address] URL: [email or web address] Date: 01/19/2020 Prepared by: Elsie Ra  Exercises Standing Scapular Retraction - 2 x daily - 7 x weekly - 10 reps - 1 sets - 5 hold Circular Shoulder Pendulum with Table Support - 3 x daily - 7 x weekly - 10 reps - 2 sets Supine Shoulder Flexion Extension AAROM with Dowel - 3 x daily - 7 x weekly - 10 reps - 1 sets Supine Shoulder External Rotation in 45 Degrees Abduction AAROM with Dowel - 3 x daily - 7 x weekly - 10 reps - 1 sets Supine Shoulder Abduction AAROM with Dowel - 3 x daily - 7 x weekly - 10 reps - 1 sets Isometric Shoulder Internal Rotation - 2 x daily - 7 x weekly - 10 reps - 1 sets - 5 hold Isometric Shoulder External Rotation - 2 x daily - 7 x weekly - 10 reps - 1 sets - 5 hold Isometric Shoulder Abduction at Wall - 2 x daily - 7 x weekly - 10 reps - 1 sets - 5 hold

## 2020-01-20 DIAGNOSIS — R338 Other retention of urine: Secondary | ICD-10-CM | POA: Diagnosis not present

## 2020-01-21 DIAGNOSIS — R338 Other retention of urine: Secondary | ICD-10-CM | POA: Diagnosis not present

## 2020-01-21 DIAGNOSIS — C679 Malignant neoplasm of bladder, unspecified: Secondary | ICD-10-CM | POA: Diagnosis not present

## 2020-01-21 DIAGNOSIS — N400 Enlarged prostate without lower urinary tract symptoms: Secondary | ICD-10-CM | POA: Diagnosis not present

## 2020-01-24 ENCOUNTER — Encounter: Payer: Self-pay | Admitting: Rehabilitative and Restorative Service Providers"

## 2020-01-24 ENCOUNTER — Ambulatory Visit: Payer: Medicare PPO | Admitting: Rehabilitative and Restorative Service Providers"

## 2020-01-24 ENCOUNTER — Other Ambulatory Visit: Payer: Self-pay

## 2020-01-24 ENCOUNTER — Encounter: Payer: Self-pay | Admitting: Family Medicine

## 2020-01-24 DIAGNOSIS — G8929 Other chronic pain: Secondary | ICD-10-CM | POA: Diagnosis not present

## 2020-01-24 DIAGNOSIS — R293 Abnormal posture: Secondary | ICD-10-CM | POA: Diagnosis not present

## 2020-01-24 DIAGNOSIS — R338 Other retention of urine: Secondary | ICD-10-CM | POA: Diagnosis not present

## 2020-01-24 DIAGNOSIS — Z8551 Personal history of malignant neoplasm of bladder: Secondary | ICD-10-CM | POA: Diagnosis not present

## 2020-01-24 DIAGNOSIS — M25512 Pain in left shoulder: Secondary | ICD-10-CM | POA: Diagnosis not present

## 2020-01-24 DIAGNOSIS — M6281 Muscle weakness (generalized): Secondary | ICD-10-CM | POA: Diagnosis not present

## 2020-01-24 NOTE — Therapy (Signed)
[place], [place], [address] Phone: [phone]   Fax:  [phone]  Physical Therapy Treatment  Patient Details  Name: Richard King MRN: [id number] Date of Birth: November 19, 1940 Referring Provider (PT): Dr. Marlou Sa   Encounter Date: 01/24/2020   PT End of Session - 01/24/20 1437    Visit Number 2    Number of Visits 12    Date for PT Re-Evaluation 03/08/20    Authorization Type [employer]    Progress Note Due on Visit 10    PT Start Time 1340    PT Stop Time 1424    PT Time Calculation (min) 44 min    Activity Tolerance Patient tolerated treatment well    Behavior During Therapy [facility] for tasks assessed/performed           Past Medical History:  Diagnosis Date  . Arthritis   . B12 deficiency   . Bladder cancer ([place])   . BPH (benign prostatic hypertrophy)   . ED (erectile dysfunction)   . History of kidney stones   . History of nephrolithiasis   . Hx of colonic polyps   . Hypertension   . Pneumonia    as a child    Past Surgical History:  Procedure Laterality Date  . bladder cancer shaving     hx bladder cancer  . CATARACT EXTRACTION    . COLONOSCOPY    . LITHOTRIPSY     per Dr. Risa Grill  . POLYPECTOMY  2009   HPP  . REVERSE SHOULDER ARTHROPLASTY Left 12/30/2019   Procedure: LEFT REVERSE SHOULDER ARTHROPLASTY;  Surgeon: Meredith Pel, MD;  Location: [place];  Service: Orthopedics;  Laterality: Left;  . 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   Subjective Assessment - 01/24/20 1434    Subjective Sleeping in a recliner is 2-3 hours uninterrupted.    Limitations Lifting    Patient Stated Goals Use Larm again    Currently in Pain? No/denies    Pain Onset More than a month ago    Aggravating Factors  Reaching and overhead function.  Can't carry anything.    Multiple Pain Sites No                              [facility] Adult PT Treatment/Exercise - 01/24/20 0001      Posture/Postural Control   Postural Limitations Rounded Shoulders;Forward head      Exercises   Exercises Shoulder      Shoulder Exercises: Supine   External Rotation AAROM;Left;10 reps   10 seconds limited to 30 degrees   Flexion AAROM;Left;10 reps   10 seconds   ABduction AAROM;Left;10 reps   5 seconds     Shoulder Exercises: Standing   Retraction Strengthening;Both;10 reps   5 seconds   Other Standing Exercises Pendulums F/B; CW; CCW; R/L 20X each      Shoulder Exercises: Pulleys   Flexion Other (comment)   10X 5 seconds   Scaption Other (comment)   10X 5 seconds     Shoulder Exercises: Isometric Strengthening   External Rotation Other (comment)   10X 5 seconds towel roll under elbow   Internal Rotation Other (comment)   10X 5 seconds towel roll under elbow 25% max effort     Manual Therapy   Manual therapy comments Lt shoulder PROM gentle  to tolerance                  PT Education - 01/24/20 1436    Education Details Reviewed HEP.  Encouraged JB to go slow with IR isometrics to protect the subscapularis.    Person(s) Educated Patient    Methods Explanation;Demonstration;Verbal cues    Comprehension Verbalized understanding;Returned demonstration;Need further instruction;Verbal cues required               PT Long Term Goals - 01/24/20 1437      PT LONG TERM GOAL #1   Title Patient will demonstrate/report pain at worst less than or equal to 3/10 to facilitate minimal limitation in daily activity secondary to pain symptoms.    Time 6    Period Weeks    Status On-going      PT LONG TERM GOAL #2   Title Patient will demonstrate independent use of home exercise program to facilitate ability to maintain/progress functional gains from skilled physical therapy services.    Time 6    Period Weeks    Status On-going      PT LONG TERM GOAL #3   Title Patient will  demonstrate Lt [name] joint mobility and AROM WFL to facilitate usual self care, dressing, reaching overhead at PLOF s limitation due to symptoms.    Time 6    Period Weeks    Status On-going      PT LONG TERM GOAL #4   Title Patient will demonstrate Lt UE MMT 4/5 throughout to facilitate usual lifting, carrying in functional activity to PLOF s limitation.    Time 6    Period Weeks    Status On-going      PT LONG TERM GOAL #5   Title -                 Plan - 01/24/20 1438    Clinical Impression Statement JB has excellent PROM/AAROM for < 4 weeks post-surgery.  HEP compliance has been good early with his PT.  He was encouraged to be gentle with IR isometrics to protect the subscapularis and to avoid pushing up out of a chair with the L arm for the same reason.  Continue PROM/AAROM and AROM emphasis.    Personal Factors and Comorbidities Comorbidity 1    Comorbidities HTN, OA    Examination-Activity Limitations Bathing;Caring for Others;Carry;Dressing;Lift;Reach Overhead;Sleep    Examination-Participation Restrictions Other;Laundry;Driving;Cleaning   household activity   Stability/Clinical Decision Making Stable/Uncomplicated    Rehab Potential Good    PT Frequency 2x / week    PT Duration 6 weeks    PT Treatment/Interventions ADLs/Self Care Home Management;Cryotherapy;Electrical Stimulation;Iontophoresis 4mg /ml Dexamethasone;Moist Heat;Ultrasound;Functional mobility training;Therapeutic activities;Therapeutic exercise;Traction;Balance training;Neuromuscular re-education;Passive range of motion;Dry needling;Patient/family education;Taping;Vasopneumatic Device;Joint Manipulations;Spinal Manipulations    PT Next Visit Plan HEP review, gentle AAROM, isometrics, PROM    PT Home Exercise Plan [email or web address]    Consulted and Agree with Plan of Care Patient           Patient will benefit from skilled therapeutic intervention in order to improve the following deficits and impairments:   Decreased endurance, Hypomobility, Pain, Impaired UE functional use, Decreased strength, Decreased activity tolerance, Decreased mobility, Decreased range of motion, Impaired flexibility, Postural dysfunction  Visit Diagnosis: Chronic left shoulder pain  Muscle weakness (generalized)  Abnormal posture     Problem List Patient Active Problem List   Diagnosis Date Noted  . Constipation 01/05/2020  . 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  Primary osteoarthritis, left shoulder 10/12/2019  . Rash 03/31/2019  . Lower urinary tract symptoms (LUTS) 08/27/2018  . BPPV (benign paroxysmal positional vertigo), right 12/24/2017  . B12 deficiency   . Ataxic gait 10/12/2017  . Caregiver role strain 11/13/2015  . 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 Knee pain 05/18/2015  . Nasal congestion 05/18/2015  . Skin lesion 03/30/2015  . Advance care planning 11/05/2014  . 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 Lower abdominal pain 09/04/2014  . ED (erectile dysfunction) 11/06/2011  . Vertigo 05/17/2011  . Medicare annual wellness visit, subsequent 05/10/2011  . Bladder cancer ([place])   . Essential hypertension 09/07/2008  . BPH (benign prostatic hyperplasia) 09/07/2008  . COLONIC POLYPS, HX OF 09/07/2008  . NEPHROLITHIASIS, HX OF 09/07/2008    Farley Ly PT, MPT 01/24/2020, 2:40 PM  [facility] [place] Physical Therapy [address] [place], [place], [address] Phone: [phone]   Fax:  [phone]  Name: Richard King MRN: [id number] Date of Birth: 02/22/1941

## 2020-01-25 ENCOUNTER — Other Ambulatory Visit: Payer: Self-pay | Admitting: Family Medicine

## 2020-01-25 ENCOUNTER — Encounter: Payer: Self-pay | Admitting: Family Medicine

## 2020-01-25 MED ORDER — SERTRALINE HCL 25 MG PO TABS: 50.0000 mg | ORAL_TABLET | Freq: Every day | ORAL | Status: DC

## 2020-01-26 ENCOUNTER — Other Ambulatory Visit: Payer: Self-pay | Admitting: Family Medicine

## 2020-01-26 MED ORDER — SERTRALINE HCL 50 MG PO TABS
50.0000 mg | ORAL_TABLET | Freq: Every day | ORAL | 3 refills | Status: DC
Start: 2020-01-26 — End: 2021-03-16

## 2020-01-31 ENCOUNTER — Ambulatory Visit: Payer: Medicare PPO | Admitting: Physical Therapy

## 2020-01-31 ENCOUNTER — Other Ambulatory Visit: Payer: Self-pay

## 2020-01-31 DIAGNOSIS — M25512 Pain in left shoulder: Secondary | ICD-10-CM

## 2020-01-31 DIAGNOSIS — R293 Abnormal posture: Secondary | ICD-10-CM

## 2020-01-31 DIAGNOSIS — G8929 Other chronic pain: Secondary | ICD-10-CM

## 2020-01-31 DIAGNOSIS — M6281 Muscle weakness (generalized): Secondary | ICD-10-CM

## 2020-01-31 DIAGNOSIS — R600 Localized edema: Secondary | ICD-10-CM

## 2020-01-31 NOTE — Therapy (Signed)
[facility] Physical Therapy [address] [place], [place], [address] Phone: [phone]   Fax:  [phone]  Physical Therapy Treatment  Patient Details  Name: Richard King MRN: [id number] Date of Birth: [birth year] Referring Provider (PT): Dr. Marlou Sa   Encounter Date: 01/31/2020   PT End of Session - 01/31/20 1014    Visit Number 3    Number of Visits 12    Date for PT Re-Evaluation 03/08/20    Authorization Type [employer]    Progress Note Due on Visit 10    PT Start Time 0930    PT Stop Time 1012    PT Time Calculation (min) 42 min    Activity Tolerance Patient tolerated treatment well    Behavior During Therapy [facility] for tasks assessed/performed           Past Medical History:  Diagnosis Date  . Arthritis   . B12 deficiency   . Bladder cancer ([place])   . BPH (benign prostatic hypertrophy)   . ED (erectile dysfunction)   . History of kidney stones   . History of nephrolithiasis   . Hx of colonic polyps   . Hypertension   . Pneumonia    as a child    Past Surgical History:  Procedure Laterality Date  . bladder cancer shaving     hx bladder cancer  . CATARACT EXTRACTION    . COLONOSCOPY    . LITHOTRIPSY     per Dr. Risa Grill  . POLYPECTOMY  2009   HPP  . REVERSE SHOULDER ARTHROPLASTY Left 12/30/2019   Procedure: LEFT REVERSE SHOULDER ARTHROPLASTY;  Surgeon: Meredith Pel, MD;  Location: [place];  Service: Orthopedics;  Laterality: Left;  . 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   Subjective Assessment - 01/31/20 0945    Subjective relays compliance with HEP, denies pain upon arrival.    Limitations Lifting    Patient Stated Goals Use L arm again    Pain Onset More than a month ago              [facility] PT Assessment - 01/31/20 0001      Assessment   Medical Diagnosis S/P Lt rTSA 12/30/19    Referring Provider (PT) Dr. Marlou Sa      PROM    Left Shoulder Flexion 140 Degrees    Left Shoulder ABduction 110 Degrees    Left Shoulder External Rotation 40 Degrees            [facility] Adult PT Treatment/Exercise - 01/31/20 0001      Shoulder Exercises: Standing   Other Standing Exercises Isometrics 5 sec X 10 reps for flexion, IR,ER      Shoulder Exercises: Pulleys   Flexion 3 minutes    Scaption 3 minutes      Shoulder Exercises: ROM/Strengthening   UBE (Upper Arm Bike) L2 2 min fwd, 2 min retro    Wall Wash wall ladder 5 reps flexion and 5 reps scaption, holding 5 sec at top      Shoulder Exercises: Isometric Strengthening   External Rotation Limitations 5 sec  X15 reps    Internal Rotation Limitations 5 sec X 15 reps      Shoulder Exercises: Stretch   Other Shoulder Stretches table slide ER stretch 10 sec X 10 reps      Manual Therapy   Manual therapy comments in supine with head  of bed elevated 45 deg: Lt shoulder PROM gentle to tolerance             PT Long Term Goals - 01/24/20 1437      PT LONG TERM GOAL #1   Title Patient will demonstrate/report pain at worst less than or equal to 3/10 to facilitate minimal limitation in daily activity secondary to pain symptoms.    Time 6    Period Weeks    Status On-going      PT LONG TERM GOAL #2   Title Patient will demonstrate independent use of home exercise program to facilitate ability to maintain/progress functional gains from skilled physical therapy services.    Time 6    Period Weeks    Status On-going      PT LONG TERM GOAL #3   Title Patient will demonstrate Lt [place] joint mobility and AROM WFL to facilitate usual self care, dressing, reaching overhead at PLOF s limitation due to symptoms.    Time 6    Period Weeks    Status On-going      PT LONG TERM GOAL #4   Title Patient will demonstrate Lt UE MMT 4/5 throughout to facilitate usual lifting, carrying in functional activity to PLOF s limitation.    Time 6    Period Weeks    Status On-going      PT  LONG TERM GOAL #5   Title -                 Plan - 01/31/20 1014    Clinical Impression Statement He has made great progres with ROM so far, see updated measurements. PT will continue to progress gently as tolerated.    Personal Factors and Comorbidities Comorbidity 1    Comorbidities HTN, OA    Examination-Activity Limitations Bathing;Caring for Others;Carry;Dressing;Lift;Reach Overhead;Sleep    Examination-Participation Restrictions Other;Laundry;Driving;Cleaning   household activity   Stability/Clinical Decision Making Stable/Uncomplicated    Rehab Potential Good    PT Frequency 2x / week    PT Duration 6 weeks    PT Treatment/Interventions ADLs/Self Care Home Management;Cryotherapy;Electrical Stimulation;Iontophoresis 4mg /ml Dexamethasone;Moist Heat;Ultrasound;Functional mobility training;Therapeutic activities;Therapeutic exercise;Traction;Balance training;Neuromuscular re-education;Passive range of motion;Dry needling;Patient/family education;Taping;Vasopneumatic Device;Joint Manipulations;Spinal Manipulations    PT Next Visit Plan HEP review, gentle AAROM, isometrics, PROM    PT Home Exercise Plan [email or web address]    Consulted and Agree with Plan of Care Patient           Patient will benefit from skilled therapeutic intervention in order to improve the following deficits and impairments:  Decreased endurance, Hypomobility, Pain, Impaired UE functional use, Decreased strength, Decreased activity tolerance, Decreased mobility, Decreased range of motion, Impaired flexibility, Postural dysfunction  Visit Diagnosis: Chronic left shoulder pain  Muscle weakness (generalized)  Abnormal posture  Localized edema     Problem List Patient Active Problem List   Diagnosis Date Noted  . Constipation 01/05/2020  . 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 Primary osteoarthritis, left shoulder 10/12/2019  . Rash 03/31/2019  . Lower urinary tract symptoms (LUTS)  08/27/2018  . BPPV (benign paroxysmal positional vertigo), right 12/24/2017  . B12 deficiency   . Ataxic gait 10/12/2017  . Caregiver role strain 11/13/2015  . 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 Knee pain 05/18/2015  . Nasal congestion 05/18/2015  . Skin lesion 03/30/2015  . Advance care planning 11/05/2014  . 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 Lower abdominal pain 09/04/2014  . ED (erectile dysfunction) 11/06/2011  . Vertigo  05/17/2011  . Medicare annual wellness visit, subsequent 05/10/2011  . Bladder cancer ([place])   . Essential hypertension 09/07/2008  . BPH (benign prostatic hyperplasia) 09/07/2008  . COLONIC POLYPS, HX OF 09/07/2008  . NEPHROLITHIASIS, HX OF 09/07/2008    Silvestre Mesi 01/31/2020, 10:15 AM  [facility] Physical Therapy [address] [place], [place], [address] Phone: [phone]   Fax:  [phone]  Name: JAQUAN SADOWSKY MRN: [id number] Date of Birth: 1940-07-24

## 2020-02-04 ENCOUNTER — Ambulatory Visit: Payer: Medicare PPO | Admitting: Physical Therapy

## 2020-02-04 ENCOUNTER — Other Ambulatory Visit: Payer: Self-pay

## 2020-02-04 DIAGNOSIS — R293 Abnormal posture: Secondary | ICD-10-CM | POA: Diagnosis not present

## 2020-02-04 DIAGNOSIS — M6281 Muscle weakness (generalized): Secondary | ICD-10-CM

## 2020-02-04 DIAGNOSIS — M25512 Pain in left shoulder: Secondary | ICD-10-CM

## 2020-02-04 DIAGNOSIS — G8929 Other chronic pain: Secondary | ICD-10-CM | POA: Diagnosis not present

## 2020-02-04 DIAGNOSIS — R600 Localized edema: Secondary | ICD-10-CM | POA: Diagnosis not present

## 2020-02-04 NOTE — Therapy (Signed)
[place] [place], [place], [address] Phone: [phone]   Fax:  [phone]  Physical Therapy Treatment  Patient Details  Name: Richard King MRN: [id number] Date of Birth: September 13, 1940 Referring Provider (PT): Dr. Marlou Sa   Encounter Date: 02/04/2020   PT End of Session - 02/04/20 1005    Visit Number 4    Number of Visits 12    Date for PT Re-Evaluation 03/08/20    Authorization Type [employer]    Progress Note Due on Visit 10    PT Start Time 0929    PT Stop Time 1010    PT Time Calculation (min) 41 min    Activity Tolerance Patient tolerated treatment well    Behavior During Therapy [facility] for tasks assessed/performed           Past Medical History:  Diagnosis Date  . Arthritis   . B12 deficiency   . Bladder cancer ([place])   . BPH (benign prostatic hypertrophy)   . ED (erectile dysfunction)   . History of kidney stones   . History of nephrolithiasis   . Hx of colonic polyps   . Hypertension   . Pneumonia    as a child    Past Surgical History:  Procedure Laterality Date  . bladder cancer shaving     hx bladder cancer  . CATARACT EXTRACTION    . COLONOSCOPY    . LITHOTRIPSY     per Dr. Risa Grill  . POLYPECTOMY  2009   HPP  . REVERSE SHOULDER ARTHROPLASTY Left 12/30/2019   Procedure: LEFT REVERSE SHOULDER ARTHROPLASTY;  Surgeon: Meredith Pel, MD;  Location: [place];  Service: Orthopedics;  Laterality: Left;  . 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   Subjective Assessment - 02/04/20 0931    Subjective relays soreness but no pain today in his shoulder    Limitations Lifting    Patient Stated Goals Use L arm again    Pain Onset More than a month ago             [facility] Adult PT Treatment/Exercise - 02/04/20 0001      Shoulder Exercises: Seated   Other Seated Exercises seated tabeslide stretches ER 5 sec X 15,  then flexion and scaption with arm on small red pball 5 sec X 15      Shoulder Exercises: Standing   Other Standing Exercises Isometrics 5 sec X 15 reps for flexion, abd IR,ER      Shoulder Exercises: Pulleys   Flexion 3 minutes    Scaption 3 minutes      Shoulder Exercises: ROM/Strengthening   UBE (Upper Arm Bike) L2 3 min fwd, 3 min retro    Wall Wash wall ladder 5 reps flexion and 5 reps scaption, holding 5 sec at top             PT Long Term Goals - 01/24/20 1437      PT LONG TERM GOAL #1   Title Patient will demonstrate/report pain at worst less than or equal to 3/10 to facilitate minimal limitation in daily activity secondary to pain symptoms.    Time 6    Period Weeks    Status On-going      PT LONG TERM GOAL #2   Title Patient will demonstrate independent use of home exercise program to facilitate ability to maintain/progress functional gains from skilled  physical therapy services.    Time 6    Period Weeks    Status On-going      PT LONG TERM GOAL #3   Title Patient will demonstrate Lt [place] joint mobility and AROM WFL to facilitate usual self care, dressing, reaching overhead at PLOF s limitation due to symptoms.    Time 6    Period Weeks    Status On-going      PT LONG TERM GOAL #4   Title Patient will demonstrate Lt UE MMT 4/5 throughout to facilitate usual lifting, carrying in functional activity to PLOF s limitation.    Time 6    Period Weeks    Status On-going      PT LONG TERM GOAL #5   Title -                 Plan - 02/04/20 1009    Clinical Impression Statement Again showed improvments in ROM and strength is beginning to improve but most limited with shoulder abduction weakness. Added AROM and isometrics into abduction into his HEP to futher facilitate strength for this.    Personal Factors and Comorbidities Comorbidity 1    Comorbidities HTN, OA    Examination-Activity Limitations Bathing;Caring for Others;Carry;Dressing;Lift;Reach  Overhead;Sleep    Examination-Participation Restrictions Other;Laundry;Driving;Cleaning   household activity   Stability/Clinical Decision Making Stable/Uncomplicated    Rehab Potential Good    PT Frequency 2x / week    PT Duration 6 weeks    PT Treatment/Interventions ADLs/Self Care Home Management;Cryotherapy;Electrical Stimulation;Iontophoresis 4mg /ml Dexamethasone;Moist Heat;Ultrasound;Functional mobility training;Therapeutic activities;Therapeutic exercise;Traction;Balance training;Neuromuscular re-education;Passive range of motion;Dry needling;Patient/family education;Taping;Vasopneumatic Device;Joint Manipulations;Spinal Manipulations    PT Next Visit Plan HEP review, gentle AAROM, isometrics, PROM    PT Home Exercise Plan [id number]    Consulted and Agree with Plan of Care Patient           Patient will benefit from skilled therapeutic intervention in order to improve the following deficits and impairments:  Decreased endurance, Hypomobility, Pain, Impaired UE functional use, Decreased strength, Decreased activity tolerance, Decreased mobility, Decreased range of motion, Impaired flexibility, Postural dysfunction  Visit Diagnosis: Chronic left shoulder pain  Muscle weakness (generalized)  Abnormal posture  Localized edema     Problem List Patient Active Problem List   Diagnosis Date Noted  . Constipation 01/05/2020  . 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 Primary osteoarthritis, left shoulder 10/12/2019  . Rash 03/31/2019  . Lower urinary tract symptoms (LUTS) 08/27/2018  . BPPV (benign paroxysmal positional vertigo), right 12/24/2017  . B12 deficiency   . Ataxic gait 10/12/2017  . Caregiver role strain 11/13/2015  . 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 Knee pain 05/18/2015  . Nasal congestion 05/18/2015  . Skin lesion 03/30/2015  . Advance care planning 11/05/2014  . 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 Lower abdominal pain 09/04/2014  . ED  (erectile dysfunction) 11/06/2011  . Vertigo 05/17/2011  . Medicare annual wellness visit, subsequent 05/10/2011  . Bladder cancer ([place])   . Essential hypertension 09/07/2008  . BPH (benign prostatic hyperplasia) 09/07/2008  . COLONIC POLYPS, HX OF 09/07/2008  . NEPHROLITHIASIS, HX OF 09/07/2008    Silvestre Mesi 02/04/2020, 10:12 AM  [facility] [address] [place], [place], [address] Phone: [phone]   Fax:  [phone]  Name: Richard King MRN: [id number] Date of Birth: 08/29/1940

## 2020-02-09 ENCOUNTER — Other Ambulatory Visit: Payer: Self-pay

## 2020-02-09 ENCOUNTER — Ambulatory Visit: Payer: Medicare PPO | Admitting: Physical Therapy

## 2020-02-09 DIAGNOSIS — M6281 Muscle weakness (generalized): Secondary | ICD-10-CM

## 2020-02-09 DIAGNOSIS — R600 Localized edema: Secondary | ICD-10-CM | POA: Diagnosis not present

## 2020-02-09 DIAGNOSIS — M25512 Pain in left shoulder: Secondary | ICD-10-CM

## 2020-02-09 DIAGNOSIS — R293 Abnormal posture: Secondary | ICD-10-CM | POA: Diagnosis not present

## 2020-02-09 DIAGNOSIS — G8929 Other chronic pain: Secondary | ICD-10-CM | POA: Diagnosis not present

## 2020-02-09 NOTE — Therapy (Signed)
[facility] [address] [place], [place], [address] Phone: [phone]   Fax:  [phone]  Physical Therapy Treatment  Patient Details  Name: Richard King MRN: [id number] Date of Birth: 11-06-1940 Referring Provider (PT): Dr. Marlou Sa   Encounter Date: 02/09/2020   PT End of Session - 02/09/20 1004    Visit Number 5    Number of Visits 12    Date for PT Re-Evaluation 03/08/20    Authorization Type [employer]    Progress Note Due on Visit 10    PT Start Time 0927    PT Stop Time 1005    PT Time Calculation (min) 38 min    Activity Tolerance Patient tolerated treatment well    Behavior During Therapy [facility] for tasks assessed/performed           Past Medical History:  Diagnosis Date  . Arthritis   . B12 deficiency   . Bladder cancer ([place])   . BPH (benign prostatic hypertrophy)   . ED (erectile dysfunction)   . History of kidney stones   . History of nephrolithiasis   . Hx of colonic polyps   . Hypertension   . Pneumonia    as a child    Past Surgical History:  Procedure Laterality Date  . bladder cancer shaving     hx bladder cancer  . CATARACT EXTRACTION    . COLONOSCOPY    . LITHOTRIPSY     per Dr. Risa Grill  . POLYPECTOMY  2009   HPP  . REVERSE SHOULDER ARTHROPLASTY Left 12/30/2019   Procedure: LEFT REVERSE SHOULDER ARTHROPLASTY;  Surgeon: Meredith Pel, MD;  Location: [place];  Service: Orthopedics;  Laterality: Left;  . 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   Subjective Assessment - 02/09/20 0941    Subjective Continues to report sorness but not pain, he is able to now do more with his shoulder    Limitations Lifting    Patient Stated Goals Use L arm again    Pain Onset More than a month ago              [facility] PT Assessment - 02/09/20 0001      Assessment   Medical Diagnosis S/P Lt rTSA 12/30/19    Referring Provider  (PT) Dr. Marlou Sa      AROM   Left Shoulder Flexion 90 Degrees    Left Shoulder ABduction 60 Degrees      PROM   Left Shoulder Flexion 140 Degrees    Left Shoulder ABduction 110 Degrees    Left Shoulder External Rotation 60 Degrees      Strength   Right Shoulder Flexion 3-/5    Right Shoulder ABduction 3-/5    Right Shoulder Internal Rotation 4/5    Right Shoulder External Rotation 3+/5            [facility] Adult PT Treatment/Exercise - 02/09/20 0001      Shoulder Exercises: Seated   Row Both;20 reps    Theraband Level (Shoulder Row) Level 2 (Red)    External Rotation AAROM;20 reps    Flexion AAROM;20 reps    Abduction AAROM;20 reps    Other Seated Exercises chest press red 2 sets of 10      Shoulder Exercises: Pulleys   Flexion 3 minutes    Scaption 3 minutes      Shoulder Exercises: ROM/Strengthening  UBE (Upper Arm Bike) L2 3 min fwd, 3 min retro                  PT Education - 02/09/20 1003    Education Details HEP progression    Person(s) Educated Patient    Methods Explanation;Demonstration;Verbal cues;Handout    Comprehension Verbalized understanding;Returned demonstration               PT Long Term Goals - 01/24/20 1437      PT LONG TERM GOAL #1   Title Patient will demonstrate/report pain at worst less than or equal to 3/10 to facilitate minimal limitation in daily activity secondary to pain symptoms.    Time 6    Period Weeks    Status On-going      PT LONG TERM GOAL #2   Title Patient will demonstrate independent use of home exercise program to facilitate ability to maintain/progress functional gains from skilled physical therapy services.    Time 6    Period Weeks    Status On-going      PT LONG TERM GOAL #3   Title Patient will demonstrate Lt [place] joint mobility and AROM WFL to facilitate usual self care, dressing, reaching overhead at PLOF s limitation due to symptoms.    Time 6    Period Weeks    Status On-going      PT LONG TERM  GOAL #4   Title Patient will demonstrate Lt UE MMT 4/5 throughout to facilitate usual lifting, carrying in functional activity to PLOF s limitation.    Time 6    Period Weeks    Status On-going      PT LONG TERM GOAL #5   Title -                 Plan - 02/09/20 1006    Clinical Impression Statement He is doing really well with his Lt shoulder post op but still with expected weakness and stiffness. He has been compliant and independent with HEP and this was progressed this session due to his progress. He relays he wishes to finish up next session and feels confident he can continue to progress on his own at home. PT will plan for DC next vsit    Personal Factors and Comorbidities Comorbidity 1    Comorbidities HTN, OA    Examination-Activity Limitations Bathing;Caring for Others;Carry;Dressing;Lift;Reach Overhead;Sleep    Examination-Participation Restrictions Other;Laundry;Driving;Cleaning   household activity   Stability/Clinical Decision Making Stable/Uncomplicated    Rehab Potential Good    PT Frequency 2x / week    PT Duration 6 weeks    PT Treatment/Interventions ADLs/Self Care Home Management;Cryotherapy;Electrical Stimulation;Iontophoresis 4mg /ml Dexamethasone;Moist Heat;Ultrasound;Functional mobility training;Therapeutic activities;Therapeutic exercise;Traction;Balance training;Neuromuscular re-education;Passive range of motion;Dry needling;Patient/family education;Taping;Vasopneumatic Device;Joint Manipulations;Spinal Manipulations    PT Next Visit [place] and Agree with Plan of Care Patient           Patient will benefit from skilled therapeutic intervention in order to improve the following deficits and impairments:  Decreased endurance, Hypomobility, Pain, Impaired UE functional use, Decreased strength, Decreased activity tolerance, Decreased mobility, Decreased range of motion, Impaired flexibility, Postural  dysfunction  Visit Diagnosis: Chronic left shoulder pain  Muscle weakness (generalized)  Abnormal posture  Localized edema     Problem List Patient Active Problem List   Diagnosis Date Noted  . Constipation 01/05/2020  . 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  Primary osteoarthritis, left shoulder 10/12/2019  . Rash 03/31/2019  . Lower urinary tract symptoms (LUTS) 08/27/2018  . BPPV (benign paroxysmal positional vertigo), right 12/24/2017  . B12 deficiency   . Ataxic gait 10/12/2017  . Caregiver role strain 11/13/2015  . 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 Knee pain 05/18/2015  . Nasal congestion 05/18/2015  . Skin lesion 03/30/2015  . Advance care planning 11/05/2014  . 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 Lower abdominal pain 09/04/2014  . ED (erectile dysfunction) 11/06/2011  . Vertigo 05/17/2011  . Medicare annual wellness visit, subsequent 05/10/2011  . Bladder cancer ([place])   . Essential hypertension 09/07/2008  . BPH (benign prostatic hyperplasia) 09/07/2008  . COLONIC POLYPS, HX OF 09/07/2008  . NEPHROLITHIASIS, HX OF 09/07/2008    Debbe Odea, PT,DPT 02/09/2020, 10:08 AM  [facility] [address] [place], [place], [address] Phone: [phone]   Fax:  [phone]  Name: ZANDEN COLVER MRN: [id number] Date of Birth: 23-Jul-1940

## 2020-02-11 ENCOUNTER — Other Ambulatory Visit: Payer: Self-pay

## 2020-02-11 ENCOUNTER — Ambulatory Visit: Payer: Medicare PPO | Admitting: Physical Therapy

## 2020-02-11 DIAGNOSIS — R293 Abnormal posture: Secondary | ICD-10-CM

## 2020-02-11 DIAGNOSIS — R600 Localized edema: Secondary | ICD-10-CM | POA: Diagnosis not present

## 2020-02-11 DIAGNOSIS — M25512 Pain in left shoulder: Secondary | ICD-10-CM

## 2020-02-11 DIAGNOSIS — M6281 Muscle weakness (generalized): Secondary | ICD-10-CM

## 2020-02-11 DIAGNOSIS — G8929 Other chronic pain: Secondary | ICD-10-CM

## 2020-02-11 NOTE — Therapy (Signed)
[facility] [address] [place], [place], [address] Phone: [phone]   Fax:  [phone]  Physical Therapy Treatment/Discharge  PHYSICAL THERAPY DISCHARGE SUMMARY  Visits from Start of Care: 6  Current functional level related to goals / functional outcomes: See below   Remaining deficits: See below   Education / Equipment: HEP Plan: Patient agrees to discharge.  Patient goals were not met. Patient is being discharged due to the patient's request.  ?????       Patient Details  Name: Richard King MRN: [id number] Date of Birth: 1941/03/09 Referring Provider (PT): Dr. Marlou Sa   Encounter Date: 02/11/2020   PT End of Session - 02/11/20 0826    Visit Number 6    Number of Visits 12    Date for PT Re-Evaluation 03/08/20    Authorization Type [employer]    Progress Note Due on Visit 10    PT Start Time 0800    PT Stop Time 0838    PT Time Calculation (min) 38 min    Activity Tolerance Patient tolerated treatment well    Behavior During Therapy [facility] for tasks assessed/performed           Past Medical History:  Diagnosis Date  . Arthritis   . B12 deficiency   . Bladder cancer ([place])   . BPH (benign prostatic hypertrophy)   . ED (erectile dysfunction)   . History of kidney stones   . History of nephrolithiasis   . Hx of colonic polyps   . Hypertension   . Pneumonia    as a child    Past Surgical History:  Procedure Laterality Date  . bladder cancer shaving     hx bladder cancer  . CATARACT EXTRACTION    . COLONOSCOPY    . LITHOTRIPSY     per Dr. Risa Grill  . POLYPECTOMY  2009   HPP  . REVERSE SHOULDER ARTHROPLASTY Left 12/30/2019   Procedure: LEFT REVERSE SHOULDER ARTHROPLASTY;  Surgeon: Meredith Pel, MD;  Location: [place];  Service: Orthopedics;  Laterality: Left;  . 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   Subjective  Assessment - 02/11/20 0824    Subjective he relays no pain, he only has a little soreness from increased activity. He relays compliance with HEP and wishes to discharge from PT    Limitations Lifting    Patient Stated Goals Use L arm again    Pain Onset More than a month ago              [facility] PT Assessment - 02/11/20 0001      Assessment   Medical Diagnosis S/P Lt rTSA 12/30/19    Referring Provider (PT) Dr. Marlou Sa      AROM   Left Shoulder Flexion 100 Degrees    Left Shoulder ABduction 70 Degrees      PROM   Left Shoulder Flexion 150 Degrees    Left Shoulder ABduction 120 Degrees    Left Shoulder External Rotation 65 Degrees      Strength   Right Shoulder Flexion 3-/5    Right Shoulder ABduction 3-/5    Right Shoulder Internal Rotation 4/5    Right Shoulder External Rotation 4/5                         [facility] Adult PT Treatment/Exercise - 02/11/20 0001  Shoulder Exercises: Seated   Extension 20 reps    Theraband Level (Shoulder Extension) Level 2 (Red)    Row Both;20 reps    Theraband Level (Shoulder Row) Level 2 (Red)    External Rotation Strengthening;Left;20 reps    Theraband Level (Shoulder External Rotation) Level 2 (Red)    Flexion AAROM;20 reps    Abduction AAROM;20 reps    Other Seated Exercises chest press red 2 sets of 10    Other Seated Exercises bilat shoulder AROM flexion and abd X 10 ea      Shoulder Exercises: Pulleys   Flexion 3 minutes    Scaption 3 minutes      Shoulder Exercises: ROM/Strengthening   UBE (Upper Arm Bike) L2 3 min fwd, 3 min retro    [place] 10 reps for flexion, and circles    Wall Wash wall ladder 5 reps flexion and 5 reps scaption, holding 5 sec at top                       PT Long Term Goals - 02/11/20 0841      PT LONG TERM GOAL #1   Title Patient will demonstrate/report pain at worst less than or equal to 3/10 to facilitate minimal limitation in daily activity secondary to pain symptoms.      Time 6    Period Weeks    Status Achieved      PT LONG TERM GOAL #2   Title Patient will demonstrate independent use of home exercise program to facilitate ability to maintain/progress functional gains from skilled physical therapy services.    Time 6    Period Weeks    Status Achieved      PT LONG TERM GOAL #3   Title Patient will demonstrate Lt [place] joint mobility and AROM WFL to facilitate usual self care, dressing, reaching overhead at PLOF s limitation due to symptoms.    Baseline has WFL PROM, still with mild to mod limitations in AROM    Time 6    Period Weeks    Status Partially Met      PT LONG TERM GOAL #4   Title Patient will demonstrate Lt UE MMT 4/5 throughout to facilitate usual lifting, carrying in functional activity to PLOF s limitation.    Baseline still with3- in flexion and abd strength    Time 6    Period Weeks    Status Partially Met      PT LONG TERM GOAL #5   Title -                 Plan - 02/11/20 0842    Clinical Impression Statement He has done really well post op. Still with expected weakness however he feels confiident he can improve on his own with HEP and requests discharge today.    Personal Factors and Comorbidities Comorbidity 1    Comorbidities HTN, OA    Examination-Activity Limitations Bathing;Caring for Others;Carry;Dressing;Lift;Reach Overhead;Sleep    Examination-Participation Restrictions Other;Laundry;Driving;Cleaning   household activity   Stability/Clinical Decision Making Stable/Uncomplicated    Rehab Potential Good    PT Frequency 2x / week    PT Duration 6 weeks    PT Treatment/Interventions ADLs/Self Care Home Management;Cryotherapy;Electrical Stimulation;Iontophoresis 75m/ml Dexamethasone;Moist Heat;Ultrasound;Functional mobility training;Therapeutic activities;Therapeutic exercise;Traction;Balance training;Neuromuscular re-education;Passive range of motion;Dry needling;Patient/family education;Taping;Vasopneumatic  Device;Joint Manipulations;Spinal Manipulations    PT Next Visit [place] Agree with  Plan of Care Patient           Patient will benefit from skilled therapeutic intervention in order to improve the following deficits and impairments:  Decreased endurance, Hypomobility, Pain, Impaired UE functional use, Decreased strength, Decreased activity tolerance, Decreased mobility, Decreased range of motion, Impaired flexibility, Postural dysfunction  Visit Diagnosis: Chronic left shoulder pain  Muscle weakness (generalized)  Abnormal posture  Localized edema     Problem List Patient Active Problem List   Diagnosis Date Noted  . Constipation 01/05/2020  . 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 Primary osteoarthritis, left shoulder 10/12/2019  . Rash 03/31/2019  . Lower urinary tract symptoms (LUTS) 08/27/2018  . BPPV (benign paroxysmal positional vertigo), right 12/24/2017  . B12 deficiency   . Ataxic gait 10/12/2017  . Caregiver role strain 11/13/2015  . 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 Knee pain 05/18/2015  . Nasal congestion 05/18/2015  . Skin lesion 03/30/2015  . Advance care planning 11/05/2014  . 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 Lower abdominal pain 09/04/2014  . ED (erectile dysfunction) 11/06/2011  . Vertigo 05/17/2011  . Medicare annual wellness visit, subsequent 05/10/2011  . Bladder cancer ([place])   . Essential hypertension 09/07/2008  . BPH (benign prostatic hyperplasia) 09/07/2008  . COLONIC POLYPS, HX OF 09/07/2008  . NEPHROLITHIASIS, HX OF 09/07/2008    Debbe Odea ,PT,DPT 02/11/2020, 8:44 AM  [facility] [address] [place], [place], [address] Phone: [phone]   Fax:  [phone]  Name: Richard King MRN: [id number] Date of Birth: 29-Jul-1940

## 2020-02-17 ENCOUNTER — Ambulatory Visit (INDEPENDENT_AMBULATORY_CARE_PROVIDER_SITE_OTHER): Payer: Medicare PPO | Admitting: Orthopedic Surgery

## 2020-02-17 DIAGNOSIS — Z96612 Presence of left artificial shoulder joint: Secondary | ICD-10-CM

## 2020-02-18 ENCOUNTER — Encounter: Payer: Self-pay | Admitting: Orthopedic Surgery

## 2020-02-18 DIAGNOSIS — R338 Other retention of urine: Secondary | ICD-10-CM | POA: Diagnosis not present

## 2020-02-18 NOTE — Progress Notes (Signed)
Post-Op Visit Note   Patient: Richard WOHLER           Date of Birth: [birth year]           MRN: [id number] Visit Date: 02/17/2020 PCP: Tonia Ghent, MD   Assessment & Plan:  Chief Complaint:  Chief Complaint  Patient presents with  . Follow-up   Visit Diagnoses:  1. History of arthroplasty of left shoulder       Plan: Rishit is a patient who is now about 6 weeks out left reverse shoulder replacement.  He is doing well.  On exam the incision is intact.  Deltoid is functional.  40 degrees of external rotation 80 degrees of abduction 150 of forward flexion.  He is doing home exercise program.  Still has catheter in place.  He is seeing a urologist about that later this week.  Patient is doing well and is satisfied with his shoulder replacement.  Lifetime 15 pound lifting limit described follow-up as needed  Follow-Up Instructions: Return if symptoms worsen or fail to improve.   Orders:  No orders of the defined types were placed in this encounter.  No orders of the defined types were placed in this encounter.   Imaging: No results found.  PMFS History: Patient Active Problem List   Diagnosis Date Noted  . Constipation 01/05/2020  . 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 Primary osteoarthritis, left shoulder 10/12/2019  . Rash 03/31/2019  . Lower urinary tract symptoms (LUTS) 08/27/2018  . BPPV (benign paroxysmal positional vertigo), right 12/24/2017  . B12 deficiency   . Ataxic gait 10/12/2017  . Caregiver role strain 11/13/2015  . 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 Knee pain 05/18/2015  . Nasal congestion 05/18/2015  . Skin lesion 03/30/2015  . Advance care planning 11/05/2014  . 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 Lower abdominal pain 09/04/2014  . ED (erectile dysfunction) 11/06/2011  . Vertigo 05/17/2011  . Medicare annual wellness visit, subsequent 05/10/2011  . Bladder cancer ([facility])   . Essential hypertension 09/07/2008  . BPH  (benign prostatic hyperplasia) 09/07/2008  . COLONIC POLYPS, HX OF 09/07/2008  . NEPHROLITHIASIS, HX OF 09/07/2008   Past Medical History:  Diagnosis Date  . Arthritis   . B12 deficiency   . Bladder cancer ([facility])   . BPH (benign prostatic hypertrophy)   . ED (erectile dysfunction)   . History of kidney stones   . History of nephrolithiasis   . Hx of colonic polyps   . Hypertension   . Pneumonia    as a child    Family History  Problem Relation Age of Onset  . Hypertension Father   . AAA (abdominal aortic aneurysm) Father   . Stroke Sister   . Cancer Neg Hx   . Diabetes Neg Hx   . Colon cancer Neg Hx   . Prostate cancer Neg Hx     Past Surgical History:  Procedure Laterality Date  . bladder cancer shaving     hx bladder cancer  . CATARACT EXTRACTION    . COLONOSCOPY    . LITHOTRIPSY     per Dr. Risa Grill  . POLYPECTOMY  2009   [facility]  . REVERSE SHOULDER ARTHROPLASTY Left 12/30/2019   Procedure: LEFT REVERSE SHOULDER ARTHROPLASTY;  Surgeon: Meredith Pel, MD;  Location: [facility];  Service: Orthopedics;  Laterality: Left;  . 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 Smoking status: Former Smoker    Quit date: 06/03/1976    Years since quitting: 43.7  . Smokeless tobacco: Never Used  Vaping Use  . Vaping Use: Never used  Substance and Sexual Activity  . Alcohol use: No    Alcohol/week: 0.0 standard drinks  . Drug use: No  . Sexual activity: Yes

## 2020-02-25 DIAGNOSIS — R338 Other retention of urine: Secondary | ICD-10-CM | POA: Diagnosis not present

## 2020-02-25 DIAGNOSIS — N401 Enlarged prostate with lower urinary tract symptoms: Secondary | ICD-10-CM | POA: Diagnosis not present

## 2020-02-29 ENCOUNTER — Ambulatory Visit (INDEPENDENT_AMBULATORY_CARE_PROVIDER_SITE_OTHER): Payer: Medicare PPO | Admitting: Family Medicine

## 2020-02-29 ENCOUNTER — Encounter: Payer: Self-pay | Admitting: Family Medicine

## 2020-02-29 ENCOUNTER — Other Ambulatory Visit: Payer: Self-pay

## 2020-02-29 VITALS — BP 132/80 | HR 75 | Temp 97.2°F | Ht 71.0 in | Wt 228.2 lb

## 2020-02-29 DIAGNOSIS — Z638 Other specified problems related to primary support group: Secondary | ICD-10-CM | POA: Diagnosis not present

## 2020-02-29 DIAGNOSIS — L559 Sunburn, unspecified: Secondary | ICD-10-CM | POA: Diagnosis not present

## 2020-02-29 DIAGNOSIS — Z96612 Presence of left artificial shoulder joint: Secondary | ICD-10-CM

## 2020-02-29 DIAGNOSIS — E538 Deficiency of other specified B group vitamins: Secondary | ICD-10-CM

## 2020-02-29 DIAGNOSIS — N400 Enlarged prostate without lower urinary tract symptoms: Secondary | ICD-10-CM

## 2020-02-29 DIAGNOSIS — H6980 Other specified disorders of Eustachian tube, unspecified ear: Secondary | ICD-10-CM

## 2020-02-29 DIAGNOSIS — H6990 Unspecified Eustachian tube disorder, unspecified ear: Secondary | ICD-10-CM

## 2020-02-29 MED ORDER — DOXAZOSIN MESYLATE 1 MG PO TABS
2.0000 mg | ORAL_TABLET | Freq: Every day | ORAL | Status: DC
Start: 2020-02-29 — End: 2020-05-12

## 2020-02-29 MED ORDER — TRIAMCINOLONE ACETONIDE 0.1 % EX CREA: 1.0000 "application " | TOPICAL_CREAM | Freq: Two times a day (BID) | CUTANEOUS | 0 refills | Status: DC

## 2020-02-29 NOTE — Progress Notes (Signed)
This visit occurred during the SARS-CoV-2 public health emergency.  Safety protocols were in place, including screening questions prior to the visit, additional usage of staff PPE, and extensive cleaning of exam room while observing appropriate contact time as indicated for disinfecting solutions.  History of urinary retention.  Leg bag is out and his urine stream isn't back to normal but he is able "to get by."  He had post op urinary retention.  He should be able to continue without cath at this point.  He has urology f/u pending.    He asked to defer his B12 shot today.  Discussed with patient.  Status post L shoulder surgery.  That is clearly better.  He does not have full range of motion for overhead movement but he is clearly better.  Mood d/w pt.  Stressors d/w pt.  Wife is at SNF currently.  Now on zoloft 50mg .  He was assuming it was helping and "I'm getting along fairly well."  Photosensitivity caution with SSRI d/w pt.  He was out in the sun, bush hogging, now with sunburn on the forehead. No ulceration.    He has a "squishy feeling" in his ears without pain.  No fevers.  Meds, vitals, and allergies reviewed.   ROS: Per HPI unless specifically indicated in ROS section   nad ncat except for sunburn without ulceration noted on the forehead bilaterally. TM wnl B, no tympanic membrane erythema.  Canals normal.  Minimal  canal cerumen.  He can feel tympanic membrane movement bilaterally with Valsalva maneuver. Neck supple, no LA rrr ctab No sunburn on the arms and palms/hands.    Left shoulder with some restriction of range of motion overhead but clearly improved compared to his status prior to surgery.  At least 30 minutes were devoted to patient care in this encounter (this can potentially include time spent reviewing the patient's file/history, interviewing and examining the patient, counseling/reviewing plan with patient, ordering referrals, ordering tests, reviewing relevant  laboratory or x-ray data, and documenting the encounter).

## 2020-02-29 NOTE — Patient Instructions (Signed)
It is easier to get sunburned taking sertraline.   Use a hat and sunscreen.   Use aloe on your forehead.  If needed, then use triamcinolone.  I sent that to the pharmacy.   Take care.  Glad to see you. Update me as needed.

## 2020-03-01 NOTE — Assessment & Plan Note (Signed)
Continue routine exercises.  He is improved.

## 2020-03-01 NOTE — Assessment & Plan Note (Signed)
Offered B12 injection today but he wanted to defer given his other issues.

## 2020-03-01 NOTE — Assessment & Plan Note (Signed)
Routine cautions discussed with patient.  Limit exposure.  Use aloe.  If needed he can try triamcinolone.  No ulceration.

## 2020-03-01 NOTE — Assessment & Plan Note (Signed)
With urination improved in the meantime, no longer needing leg bag.  He has urology follow-up pending.

## 2020-03-01 NOTE — Assessment & Plan Note (Signed)
This likely explains the intermittent symptoms he noted in his ears.  Would avoid decongestants given his recent postop urinary retention.  Would use Valsalva intermittently in the meantime and update me as needed.  He agrees.

## 2020-03-01 NOTE — Assessment & Plan Note (Signed)
Would continue with Zoloft 50 mg as is.  It can induce photosensitivity and cautions given to patient.  He thinks his mood is better on the higher dose.

## 2020-03-02 DIAGNOSIS — N401 Enlarged prostate with lower urinary tract symptoms: Secondary | ICD-10-CM | POA: Diagnosis not present

## 2020-03-02 DIAGNOSIS — R3912 Poor urinary stream: Secondary | ICD-10-CM | POA: Diagnosis not present

## 2020-03-09 ENCOUNTER — Ambulatory Visit (INDEPENDENT_AMBULATORY_CARE_PROVIDER_SITE_OTHER): Payer: Medicare PPO

## 2020-03-09 ENCOUNTER — Other Ambulatory Visit: Payer: Self-pay

## 2020-03-09 DIAGNOSIS — E538 Deficiency of other specified B group vitamins: Secondary | ICD-10-CM | POA: Diagnosis not present

## 2020-03-09 MED ORDER — CYANOCOBALAMIN 1000 MCG/ML IJ SOLN
1000.0000 ug | Freq: Once | INTRAMUSCULAR | Status: AC
  Administered 2020-03-09: 1000 ug via INTRAMUSCULAR

## 2020-03-09 NOTE — Progress Notes (Signed)
Per orders of Dr. Duncan, injection of vitamin b12 given by Ermin Parisien. Patient tolerated injection well. 

## 2020-03-23 ENCOUNTER — Ambulatory Visit (INDEPENDENT_AMBULATORY_CARE_PROVIDER_SITE_OTHER): Payer: Medicare PPO

## 2020-03-23 ENCOUNTER — Other Ambulatory Visit: Payer: Self-pay

## 2020-03-23 ENCOUNTER — Telehealth: Payer: Self-pay | Admitting: Family Medicine

## 2020-03-23 DIAGNOSIS — E538 Deficiency of other specified B group vitamins: Secondary | ICD-10-CM | POA: Diagnosis not present

## 2020-03-23 MED ORDER — CYANOCOBALAMIN 1000 MCG/ML IJ SOLN
1000.0000 ug | Freq: Once | INTRAMUSCULAR | Status: AC
  Administered 2020-03-23: 1000 ug via INTRAMUSCULAR

## 2020-03-23 NOTE — Progress Notes (Signed)
Per orders of Dr. Duncan, injection of vit B12 given by Meredith Kilbride. Patient tolerated injection well.  

## 2020-03-23 NOTE — Telephone Encounter (Signed)
Patient dropped off handicapped placard form. When form is completed, please mail form to patient. Form is in rx tower.

## 2020-03-24 ENCOUNTER — Encounter: Payer: Self-pay | Admitting: Family Medicine

## 2020-03-24 NOTE — Telephone Encounter (Signed)
Form done.  Please mail.  Thanks.

## 2020-03-24 NOTE — Telephone Encounter (Signed)
Mailed

## 2020-04-05 DIAGNOSIS — R350 Frequency of micturition: Secondary | ICD-10-CM | POA: Diagnosis not present

## 2020-04-05 DIAGNOSIS — N401 Enlarged prostate with lower urinary tract symptoms: Secondary | ICD-10-CM | POA: Diagnosis not present

## 2020-04-05 DIAGNOSIS — R351 Nocturia: Secondary | ICD-10-CM | POA: Diagnosis not present

## 2020-04-05 DIAGNOSIS — Z8551 Personal history of malignant neoplasm of bladder: Secondary | ICD-10-CM | POA: Diagnosis not present

## 2020-04-05 DIAGNOSIS — N3000 Acute cystitis without hematuria: Secondary | ICD-10-CM | POA: Diagnosis not present

## 2020-04-06 ENCOUNTER — Ambulatory Visit (INDEPENDENT_AMBULATORY_CARE_PROVIDER_SITE_OTHER): Payer: Medicare PPO

## 2020-04-06 ENCOUNTER — Other Ambulatory Visit: Payer: Self-pay

## 2020-04-06 DIAGNOSIS — E538 Deficiency of other specified B group vitamins: Secondary | ICD-10-CM | POA: Diagnosis not present

## 2020-04-06 MED ORDER — CYANOCOBALAMIN 1000 MCG/ML IJ SOLN
1000.0000 ug | Freq: Once | INTRAMUSCULAR | Status: AC
Start: 2020-04-06 — End: 2020-04-06
  Administered 2020-04-06: 1000 ug via INTRAMUSCULAR

## 2020-04-06 NOTE — Progress Notes (Signed)
Per orders of Dr. Duncan, injection of vit B12 given by Chistian Kasler. Patient tolerated injection well.  

## 2020-04-20 ENCOUNTER — Other Ambulatory Visit: Payer: Self-pay

## 2020-04-20 ENCOUNTER — Ambulatory Visit: Payer: Medicare PPO

## 2020-04-26 ENCOUNTER — Ambulatory Visit (INDEPENDENT_AMBULATORY_CARE_PROVIDER_SITE_OTHER): Payer: Medicare PPO

## 2020-04-26 ENCOUNTER — Other Ambulatory Visit: Payer: Self-pay

## 2020-04-26 DIAGNOSIS — E538 Deficiency of other specified B group vitamins: Secondary | ICD-10-CM

## 2020-04-26 MED ORDER — CYANOCOBALAMIN 1000 MCG/ML IJ SOLN
1000.0000 ug | Freq: Once | INTRAMUSCULAR | Status: AC
  Administered 2020-04-26: 1000 ug via INTRAMUSCULAR

## 2020-04-26 NOTE — Progress Notes (Signed)
Dr Damita Dunnings out of the office, forward to Dr Danise Mina in his absence  Per orders of Dr. Damita Dunnings, injection of B12 Right Deltoid given by Lurlean Nanny. Patient tolerated injection well.

## 2020-05-04 ENCOUNTER — Other Ambulatory Visit: Payer: Self-pay

## 2020-05-04 ENCOUNTER — Ambulatory Visit (INDEPENDENT_AMBULATORY_CARE_PROVIDER_SITE_OTHER): Payer: Medicare PPO

## 2020-05-04 DIAGNOSIS — E538 Deficiency of other specified B group vitamins: Secondary | ICD-10-CM

## 2020-05-04 MED ORDER — CYANOCOBALAMIN 1000 MCG/ML IJ SOLN
1000.0000 ug | Freq: Once | INTRAMUSCULAR | Status: AC
  Administered 2020-05-04: 1000 ug via INTRAMUSCULAR

## 2020-05-04 NOTE — Progress Notes (Signed)
Per orders of Dr. Damita Dunnings, injection of B12, given by Aneta Mins, RN. Patient tolerated injection well in L Deltoid.

## 2020-05-08 DIAGNOSIS — H10413 Chronic giant papillary conjunctivitis, bilateral: Secondary | ICD-10-CM | POA: Diagnosis not present

## 2020-05-08 DIAGNOSIS — H02052 Trichiasis without entropian right lower eyelid: Secondary | ICD-10-CM | POA: Diagnosis not present

## 2020-05-10 ENCOUNTER — Other Ambulatory Visit: Payer: Self-pay | Admitting: Family Medicine

## 2020-05-11 NOTE — Telephone Encounter (Signed)
I do not see whee you have prescribe this Rx.  Please Advise

## 2020-05-12 NOTE — Telephone Encounter (Signed)
Had rx'd prev.  Okay to continue.  rx sent.

## 2020-05-16 DIAGNOSIS — D225 Melanocytic nevi of trunk: Secondary | ICD-10-CM | POA: Diagnosis not present

## 2020-05-16 DIAGNOSIS — L720 Epidermal cyst: Secondary | ICD-10-CM | POA: Diagnosis not present

## 2020-05-18 ENCOUNTER — Ambulatory Visit (INDEPENDENT_AMBULATORY_CARE_PROVIDER_SITE_OTHER): Payer: Medicare PPO

## 2020-05-18 ENCOUNTER — Other Ambulatory Visit: Payer: Self-pay

## 2020-05-18 DIAGNOSIS — E538 Deficiency of other specified B group vitamins: Secondary | ICD-10-CM

## 2020-05-18 MED ORDER — CYANOCOBALAMIN 1000 MCG/ML IJ SOLN
1000.0000 ug | Freq: Once | INTRAMUSCULAR | Status: AC
Start: 2020-05-18 — End: 2020-05-18
  Administered 2020-05-18: 1000 ug via INTRAMUSCULAR

## 2020-05-18 NOTE — Progress Notes (Signed)
Per orders of Dr. Duncan, injection of vit B12 given by Serenna Deroy. Patient tolerated injection well.  

## 2020-05-22 DIAGNOSIS — Z8616 Personal history of COVID-19: Secondary | ICD-10-CM

## 2020-05-22 HISTORY — DX: Personal history of COVID-19: Z86.16

## 2020-05-23 DIAGNOSIS — L820 Inflamed seborrheic keratosis: Secondary | ICD-10-CM | POA: Diagnosis not present

## 2020-05-31 ENCOUNTER — Encounter (HOSPITAL_COMMUNITY): Payer: Self-pay | Admitting: Emergency Medicine

## 2020-05-31 ENCOUNTER — Telehealth: Payer: Self-pay | Admitting: Family Medicine

## 2020-05-31 ENCOUNTER — Other Ambulatory Visit: Payer: Self-pay

## 2020-05-31 ENCOUNTER — Emergency Department (HOSPITAL_COMMUNITY): Payer: Medicare PPO

## 2020-05-31 ENCOUNTER — Emergency Department (HOSPITAL_COMMUNITY)
Admission: EM | Admit: 2020-05-31 | Discharge: 2020-06-01 | Disposition: A | Discharge status: Home / Self Care | Payer: Medicare PPO | Attending: Emergency Medicine | Admitting: Emergency Medicine

## 2020-05-31 DIAGNOSIS — Z87891 Personal history of nicotine dependence: Secondary | ICD-10-CM | POA: Insufficient documentation

## 2020-05-31 DIAGNOSIS — U071 COVID-19: Secondary | ICD-10-CM | POA: Insufficient documentation

## 2020-05-31 DIAGNOSIS — Z87438 Personal history of other diseases of male genital organs: Secondary | ICD-10-CM | POA: Diagnosis not present

## 2020-05-31 DIAGNOSIS — R0789 Other chest pain: Secondary | ICD-10-CM | POA: Diagnosis not present

## 2020-05-31 DIAGNOSIS — Z23 Encounter for immunization: Secondary | ICD-10-CM | POA: Insufficient documentation

## 2020-05-31 DIAGNOSIS — R5383 Other fatigue: Secondary | ICD-10-CM | POA: Diagnosis not present

## 2020-05-31 DIAGNOSIS — I10 Essential (primary) hypertension: Secondary | ICD-10-CM | POA: Diagnosis not present

## 2020-05-31 DIAGNOSIS — Z8551 Personal history of malignant neoplasm of bladder: Secondary | ICD-10-CM | POA: Diagnosis not present

## 2020-05-31 DIAGNOSIS — R079 Chest pain, unspecified: Secondary | ICD-10-CM | POA: Diagnosis not present

## 2020-05-31 DIAGNOSIS — R059 Cough, unspecified: Secondary | ICD-10-CM | POA: Diagnosis not present

## 2020-05-31 HISTORY — DX: COVID-19: U07.1

## 2020-05-31 LAB — BASIC METABOLIC PANEL
Anion gap: 12 (ref 5–15)
BUN: 14 mg/dL (ref 8–23)
CO2: 22 mmol/L (ref 22–32)
Calcium: 9.1 mg/dL (ref 8.9–10.3)
Chloride: 104 mmol/L (ref 98–111)
Creatinine, Ser: 0.91 mg/dL (ref 0.61–1.24)
GFR, Estimated: >60 mL/min (ref >60)
Glucose, Bld: 87 mg/dL (ref 70–99)
Potassium: 4.2 mmol/L (ref 3.5–5.1)
Sodium: 138 mmol/L (ref 135–145)

## 2020-05-31 LAB — TROPONIN I (HIGH SENSITIVITY)
Troponin I (High Sensitivity): 5 ng/L (ref <18)
Troponin I (High Sensitivity): 6 ng/L (ref <18)

## 2020-05-31 LAB — CBC
HCT: 39.6 % (ref 39.0–52.0)
Hemoglobin: 13 g/dL (ref 13.0–17.0)
MCH: 31.5 pg (ref 26.0–34.0)
MCHC: 32.8 g/dL (ref 30.0–36.0)
MCV: 95.9 fL (ref 80.0–100.0)
Platelets: 210 10*3/uL (ref 150–400)
RBC: 4.13 MIL/uL — ABNORMAL LOW (ref 4.22–5.81)
RDW: 14.2 % (ref 11.5–15.5)
WBC: 5.1 10*3/uL (ref 4.0–10.5)
nRBC: 0 % (ref 0.0–0.2)

## 2020-05-31 LAB — POC SARS CORONAVIRUS 2 AG -  ED: SARS Coronavirus 2 Ag: POSITIVE — AB

## 2020-05-31 NOTE — ED Triage Notes (Signed)
Pt arrives to ED via [facility] with complaints of generalized intermittent chest pain since yesterday. Per pt he has had a cough x3 days. Cough is dry. States cold chills but no fever. EMS gave 324 ASA, CP currently 0/10.

## 2020-05-31 NOTE — Telephone Encounter (Signed)
Agreed, will await ER notes.  Thanks.

## 2020-05-31 NOTE — Telephone Encounter (Signed)
called in asking if he could be seen in our office today. Advised to fo to nearest ER or Urgent Care for chest pains. thank you

## 2020-05-31 NOTE — Telephone Encounter (Signed)
Noted  

## 2020-05-31 NOTE — Telephone Encounter (Signed)
I spoke with pt; pt has called walk in clinics and UC and pt has to go on line to schedule appt. Pt said he cannot go online. Pt said having CP in middle of throat and across chest; pt SOB and pt said his shoulder hurts but had surgery on shoulder and not sure what shoulder pain could be related to. I spoke with Tammy (DPR signed) and she is at the pts home now. Tammy said that her dad is breathing OK at this time but presently having CP. Tammy said that she was concerned about her mom also; I went to her moms chart Delphin Funes and documented her info. I called 911 for both pts per Tammys request.  I stayed on phone with Tammy until EMS got there.FYI to Dr Para March who is out of office and Dr Reece Agar who is in office.

## 2020-05-31 NOTE — Telephone Encounter (Signed)
[facility] - Client TELEPHONE ADVICE RECORD [facility] Patient Name: Richard King Gender: Male DOB: 15-Nov-1940 Age: 79 Y 11 M 17 D Return Phone Number: [phone] (Primary) Address: City/State/Zip: [place] [address] Client [facility] - Client Client Site [facility] Physician Renford Dills - MD Contact Type Call Who Is Calling Patient / Member / Family / Caregiver Call Type Triage / Clinical Caller Name Raquel Sarna Relationship To Patient Other Return Phone Number [phone] (Primary) Chief Complaint BREATHING - shortness of breath or sounds breathless Reason for Call Symptomatic / Request for [facility] is calling from [facility] on behalf of, Richard King DOB 12-Sep-1940. He is short of breath and having chest pain after being exposed to COVID. Additional Comment Caller states the office does not have any openings until next week and they are not seeing patient with sx in the office. Translation No Nurse Assessment Nurse: Windle Guard, RN, Lesa Date/Time (Eastern Time): 05/31/2020 10:28:53 AM Confirm and document reason for call. If symptomatic, describe symptoms. ---Caller states he has a cough with chest pain. He has been exposed to Covid Does the patient have any new or worsening symptoms? ---Yes Will a triage be completed? ---Yes Related visit to physician within the last 2 weeks? ---No Does the PT have any chronic conditions? (i.e. diabetes, asthma, this includes High risk factors for pregnancy, etc.) ---Yes List chronic conditions. ---HTN, anxiety, enlarged prostate Is this a behavioral health or substance abuse call? ---No Guidelines Guideline Title Affirmed Question Affirmed Notes Nurse Date/Time ([place] Time) COVID-19 - Diagnosed or Suspected Chest pain or pressure Conner, RN, Lesa 05/31/2020 10:30:25 AM Disp. Time Eil[place] Ghazi Time) Disposition  Final User 05/31/2020 10:26:18 AM Send to Urgent Pablo Ledger, Bradi 05/31/2020 10:34:41 AM Go to ED Now (or PCP triage) Yes Conner, RN, Lesa PLEASE NOTE: All timestamps contained within this report are represented as [place] Standard Time. CONFIDENTIALTY NOTICE: This fax transmission is intended only for the addressee. It contains information that is legally privileged, confidential or otherwise protected from use or disclosure. If you are not the intended recipient, you are strictly prohibited from reviewing, disclosing, copying using or disseminating any of this information or taking any action in reliance on or regarding this information. If you have received this fax in error, please notify us immediately by telephone so that we can arrange for its return to [place]. Phone: [phone], Toll-Free: [phone], Fax: [phone] Page: 2 of 2 Call Id: [id number] [place] Disagree/Comply Comply Caller Understands Yes PreDisposition Did not know what to do Care Advice Given Per Guideline GO TO ED NOW (OR PCP TRIAGE): * IF NO PCP (PRIMARY CARE PROVIDER) SECOND-LEVEL TRIAGE: You need to be seen within the next hour. Go to the [facility] at _____________ [place] as soon as you can. GENERAL CARE ADVICE FOR COVID-19 SYMPTOMS: * The treatment is the same whether you have COVID-19, influenza or some other respiratory virus. * Cough: Use cough drops. * Feeling dehydrated: Drink extra liquids. If the air in your home is dry, use a humidifier. * Fever: For fever over 101 F (38.3 C), take acetaminophen every 4 to 6 hours (Adults 650 mg) OR ibuprofen every 6 to 8 hours (Adults 400 mg). Before taking any medicine, read all the instructions on the package. Do not take aspirin unless your doctor has prescribed it for you. * Muscle aches, headache, and other pains: Often this comes and goes with the fever.  Take acetaminophen every 4 to 6 hours (Adults 650 mg) OR ibuprofen every 6 to 8 hours (Adults 400 mg). Before  taking any medicine, read all the instructions on the package. CARE ADVICE given per COVID-19 - DIAGNOSED OR SUSPECTED (Adult) guideline. Referrals GO TO FACILITY UNDECIDED

## 2020-06-01 ENCOUNTER — Telehealth: Payer: Self-pay | Admitting: Family Medicine

## 2020-06-01 ENCOUNTER — Ambulatory Visit: Payer: Medicare PPO

## 2020-06-01 DIAGNOSIS — U071 COVID-19: Secondary | ICD-10-CM | POA: Diagnosis not present

## 2020-06-01 DIAGNOSIS — R059 Cough, unspecified: Secondary | ICD-10-CM | POA: Diagnosis present

## 2020-06-01 DIAGNOSIS — Z23 Encounter for immunization: Secondary | ICD-10-CM | POA: Diagnosis not present

## 2020-06-01 MED ORDER — METHYLPREDNISOLONE SODIUM SUCC 125 MG IJ SOLR: 125.0000 mg | Freq: Once | INTRAMUSCULAR | Status: DC | PRN

## 2020-06-01 MED ORDER — SODIUM CHLORIDE 0.9 % IV SOLN: 1200.0000 mg | Freq: Once | INTRAVENOUS | Status: DC

## 2020-06-01 MED ORDER — EPINEPHRINE 0.3 MG/0.3ML IJ SOAJ: 0.3000 mg | Freq: Once | INTRAMUSCULAR | Status: DC | PRN

## 2020-06-01 MED ORDER — SODIUM CHLORIDE 0.9 % IV SOLN: INTRAVENOUS | Status: DC | PRN

## 2020-06-01 MED ORDER — ALBUTEROL SULFATE HFA 108 (90 BASE) MCG/ACT IN AERS: 2.0000 | INHALATION_SPRAY | Freq: Once | RESPIRATORY_TRACT | Status: DC | PRN

## 2020-06-01 MED ORDER — DIPHENHYDRAMINE HCL 50 MG/ML IJ SOLN: 50.0000 mg | Freq: Once | INTRAMUSCULAR | Status: DC | PRN

## 2020-06-01 MED ORDER — SODIUM CHLORIDE 0.9 % IV SOLN
1200.0000 mg | Freq: Once | INTRAVENOUS | Status: AC
  Administered 2020-06-01: 1200 mg via INTRAVENOUS
  Filled 2020-06-01: qty 10

## 2020-06-01 MED ORDER — FAMOTIDINE 20 MG IN NS 100 ML IVPB: 20.0000 mg | Freq: Once | INTRAVENOUS | Status: DC | PRN

## 2020-06-01 NOTE — Telephone Encounter (Signed)
Called and left voicemail for patient to return call to office.  °

## 2020-06-01 NOTE — Telephone Encounter (Signed)
Patient is positive for covid and just got out of the hospital. Patient was instructed to call you. Please advise EM

## 2020-06-01 NOTE — Discharge Instructions (Signed)
Person Under Monitoring Name: Richard King  Location: [address] [place] [address]   Infection Prevention Recommendations for Individuals Confirmed to have, or Being Evaluated for, 2019 Novel Coronavirus (COVID-19) Infection Who Receive Care at Home  Individuals who are confirmed to have, or are being evaluated for, COVID-19 should follow the prevention steps below until a healthcare provider or local or state health department says they can return to normal activities.  Stay home except to get medical care You should restrict activities outside your home, except for getting medical care. Do not go to work, school, or public areas, and do not use public transportation or taxis.  Call ahead before visiting your doctor Before your medical appointment, call the healthcare provider and tell them that you have, or are being evaluated for, COVID-19 infection. This will help the healthcare provider's office take steps to keep other people from getting infected. Ask your healthcare provider to call the local or state health department.  Monitor your symptoms Seek prompt medical attention if your illness is worsening (e.g., difficulty breathing). Before going to your medical appointment, call the healthcare provider and tell them that you have, or are being evaluated for, COVID-19 infection. Ask your healthcare provider to call the local or state health department.  Wear a facemask You should wear a facemask that covers your nose and mouth when you are in the same room with other people and when you visit a healthcare provider. People who live with or visit you should also wear a facemask while they are in the same room with you.  Separate yourself from other people in your home As much as possible, you should stay in a different room from other people in your home. Also, you should use a separate bathroom, if available.  Avoid sharing household items You should not share dishes,  drinking glasses, cups, eating utensils, towels, bedding, or other items with other people in your home. After using these items, you should wash them thoroughly with soap and water.  Cover your coughs and sneezes Cover your mouth and nose with a tissue when you cough or sneeze, or you can cough or sneeze into your sleeve. Throw used tissues in a lined trash can, and immediately wash your hands with soap and water for at least 20 seconds or use an alcohol-based hand rub.  Wash your [employer] your hands often and thoroughly with soap and water for at least 20 seconds. You can use an alcohol-based hand sanitizer if soap and water are not available and if your hands are not visibly dirty. Avoid touching your eyes, nose, and mouth with unwashed hands.   Prevention Steps for Caregivers and Household Members of Individuals Confirmed to have, or Being Evaluated for, COVID-19 Infection Being Cared for in the Home  If you live with, or provide care at home for, a person confirmed to have, or being evaluated for, COVID-19 infection please follow these guidelines to prevent infection:  Follow healthcare provider's instructions Make sure that you understand and can help the patient follow any healthcare provider instructions for all care.  Provide for the patient's basic needs You should help the patient with basic needs in the home and provide support for getting groceries, prescriptions, and other personal needs.  Monitor the patient's symptoms If they are getting sicker, call his or her medical provider and tell them that the patient has, or is being evaluated for, COVID-19 infection. This will help the healthcare provider's office  take steps to keep other people from getting infected. Ask the healthcare provider to call the local or state health department.  Limit the number of people who have contact with the patient If possible, have only one caregiver for the patient. Other household  members should stay in another home or place of residence. If this is not possible, they should stay in another room, or be separated from the patient as much as possible. Use a separate bathroom, if available. Restrict visitors who do not have an essential need to be in the home.  Keep older adults, very young children, and other sick people away from the patient Keep older adults, very young children, and those who have compromised immune systems or chronic health conditions away from the patient. This includes people with chronic heart, lung, or kidney conditions, diabetes, and cancer.  Ensure good ventilation Make sure that shared spaces in the home have good air flow, such as from an air conditioner or an opened window, weather permitting.  Wash your hands often Wash your hands often and thoroughly with soap and water for at least 20 seconds. You can use an alcohol based hand sanitizer if soap and water are not available and if your hands are not visibly dirty. Avoid touching your eyes, nose, and mouth with unwashed hands. Use disposable paper towels to dry your hands. If not available, use dedicated cloth towels and replace them when they become wet.  Wear a facemask and gloves Wear a disposable facemask at all times in the room and gloves when you touch or have contact with the patient's blood, body fluids, and/or secretions or excretions, such as sweat, saliva, sputum, nasal mucus, vomit, urine, or feces.  Ensure the mask fits over your nose and mouth tightly, and do not touch it during use. Throw out disposable facemasks and gloves after using them. Do not reuse. Wash your hands immediately after removing your facemask and gloves. If your personal clothing becomes contaminated, carefully remove clothing and launder. Wash your hands after handling contaminated clothing. Place all used disposable facemasks, gloves, and other waste in a lined container before disposing them with other  household waste. Remove gloves and wash your hands immediately after handling these items.  Do not share dishes, glasses, or other household items with the patient Avoid sharing household items. You should not share dishes, drinking glasses, cups, eating utensils, towels, bedding, or other items with a patient who is confirmed to have, or being evaluated for, COVID-19 infection. After the person uses these items, you should wash them thoroughly with soap and water.  Wash laundry thoroughly Immediately remove and wash clothes or bedding that have blood, body fluids, and/or secretions or excretions, such as sweat, saliva, sputum, nasal mucus, vomit, urine, or feces, on them. Wear gloves when handling laundry from the patient. Read and follow directions on labels of laundry or clothing items and detergent. In general, wash and dry with the warmest temperatures recommended on the label.  Clean all areas the individual has used often Clean all touchable surfaces, such as counters, tabletops, doorknobs, bathroom fixtures, toilets, phones, keyboards, tablets, and bedside tables, every day. Also, clean any surfaces that may have blood, body fluids, and/or secretions or excretions on them. Wear gloves when cleaning surfaces the patient has come in contact with. Use a diluted bleach solution (e.g., dilute bleach with 1 part bleach and 10 parts water) or a household disinfectant with a label that says EPA-registered for coronaviruses. To make a bleach  solution at home, add 1 tablespoon of bleach to 1 quart (4 cups) of water. For a larger supply, add  cup of bleach to 1 gallon (16 cups) of water. Read labels of cleaning products and follow recommendations provided on product labels. Labels contain instructions for safe and effective use of the cleaning product including precautions you should take when applying the product, such as wearing gloves or eye protection and making sure you have good ventilation  during use of the product. Remove gloves and wash hands immediately after cleaning.  Monitor yourself for signs and symptoms of illness Caregivers and household members are considered close contacts, should monitor their health, and will be asked to limit movement outside of the home to the extent possible. Follow the monitoring steps for close contacts listed on the symptom monitoring form.   ? If you have additional questions, contact your local health department or call the epidemiologist on call at [phone] (available 24/7). ? This guidance is subject to change. For the most up-to-date guidance from [facility], please refer to their website: [email or web address]

## 2020-06-01 NOTE — ED Provider Notes (Signed)
Emergency Department Provider Note   I have reviewed the triage vital signs and the nursing notes.   HISTORY  Chief Complaint Chest Pain and Cough   HPI Richard King is a 79 y.o. male with past medical history of hypertension presents to the emergency department with fatigue with cough, congestion, and soreness in the chest for the past 3 days.  He states that he was at a Christmas gathering and multiple people from his family of recently tested positive for COVID-19.  He reports some cold chills but no documented fever.  When asking about chest pain he denies this adamantly but states he did have some soreness mainly with coughing.  His cough is dry.  He is not having any active chest discomfort.  He arrived by EMS and was given 324 mg of aspirin initially.  He is not vaccinated. No radiation of symptoms or modifying factors.   Past Medical History:  Diagnosis Date  . Arthritis   . B12 deficiency   . Bladder cancer ([place])   . BPH (benign prostatic hypertrophy)   . ED (erectile dysfunction)   . History of kidney stones   . History of nephrolithiasis   . Hx of colonic polyps   . Hypertension   . Pneumonia    as a child    Patient Active Problem List   Diagnosis Date Noted  . Constipation 01/05/2020  . 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 Primary osteoarthritis, left shoulder 10/12/2019  . Sunburn 03/31/2019  . Lower urinary tract symptoms (LUTS) 08/27/2018  . BPPV (benign paroxysmal positional vertigo), right 12/24/2017  . ETD (eustachian tube dysfunction) 10/31/2017  . B12 deficiency   . Ataxic gait 10/12/2017  . Caregiver role strain 11/13/2015  . 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 Knee pain 05/18/2015  . Nasal congestion 05/18/2015  . Skin lesion 03/30/2015  . Advance care planning 11/05/2014  . 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 Lower abdominal pain 09/04/2014  . ED (erectile dysfunction) 11/06/2011  . Vertigo 05/17/2011  . Medicare  annual wellness visit, subsequent 05/10/2011  . Bladder cancer ([place])   . Essential hypertension 09/07/2008  . BPH (benign prostatic hyperplasia) 09/07/2008  . COLONIC POLYPS, HX OF 09/07/2008  . NEPHROLITHIASIS, HX OF 09/07/2008    Past Surgical History:  Procedure Laterality Date  . bladder cancer shaving     hx bladder cancer  . CATARACT EXTRACTION    . COLONOSCOPY    . LITHOTRIPSY     per Dr. Risa Grill  . POLYPECTOMY  2009   [facility]  . REVERSE SHOULDER ARTHROPLASTY Left 12/30/2019   Procedure: LEFT REVERSE SHOULDER ARTHROPLASTY;  Surgeon: Meredith Pel, MD;  Location: [facility];  Service: Orthopedics;  Laterality: Left;  . 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 Hypertension Father   . AAA (abdominal aortic aneurysm) Father   . Stroke Sister   . Cancer Neg Hx   . Diabetes Neg Hx   . Colon cancer Neg Hx   . Prostate cancer Neg Hx     Social History Social History   Tobacco Use  . Smoking status: Former Smoker    Quit date: 06/03/1976    Years since quitting: 44.0  . Smokeless tobacco: Never Used  Vaping Use  . Vaping Use: Never used  Substance Use Topics  .  Alcohol use: No    Alcohol/week: 0.0 standard drinks  . Drug use: No    Review of Systems  Constitutional: No fever. Positive chills Eyes: No visual changes. ENT: No sore throat. Cardiovascular: Denies chest pain but reports "soreness" with cough.  Respiratory: Denies shortness of breath. Positive cough.  Gastrointestinal: No abdominal pain.  No nausea, no vomiting.  No diarrhea.  No constipation. Genitourinary: Negative for dysuria. Musculoskeletal: Negative for back pain. Skin: Negative for rash. Neurological: Negative for headaches, focal weakness or numbness.  10-point ROS otherwise  negative.  ____________________________________________   PHYSICAL EXAM:  VITAL SIGNS: ED Triage Vitals  Enc Vitals Group     BP 05/31/20 1349 136/82     Pulse Rate 05/31/20 1349 78     Resp 05/31/20 1349 18     Temp 05/31/20 1349 98 F (36.7 C)     Temp Source 05/31/20 1349 Oral     SpO2 05/31/20 1349 98 %   Constitutional: Alert and oriented. Well appearing and in no acute distress. Eyes: Conjunctivae are normal.  Head: Atraumatic. Nose: No congestion/rhinnorhea. Mouth/Throat: Mucous membranes are moist.  Neck: No stridor.   Cardiovascular: Normal rate, regular rhythm. Good peripheral circulation. Grossly normal heart sounds.   Respiratory: Normal respiratory effort.  No retractions. Lungs CTAB. Gastrointestinal: Soft and nontender. No distention.  Musculoskeletal: No lower extremity tenderness nor edema. No gross deformities of extremities. Neurologic:  Normal speech and language. No gross focal neurologic deficits are appreciated.  Skin:  Skin is warm, dry and intact. No rash noted.  ____________________________________________   LABS (all labs ordered are listed, but only abnormal results are displayed)  Labs Reviewed  CBC - Abnormal; Notable for the following components:      Result Value   RBC 4.13 (*)    All other components within normal limits  POC SARS CORONAVIRUS 2 AG -  ED - Abnormal; Notable for the following components:   SARS Coronavirus 2 Ag POSITIVE (*)    All other components within normal limits  BASIC METABOLIC PANEL  TROPONIN I (HIGH SENSITIVITY)  TROPONIN I (HIGH SENSITIVITY)   ____________________________________________  EKG   EKG Interpretation  Date/Time:  Wednesday May 31 2020 13:51:13 EST Ventricular Rate:  74 PR Interval:  244 QRS Duration: 100 QT Interval:  368 QTC Calculation: 408 R Axis:   -50 Text Interpretation: 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  ____________________________________________   PROCEDURES  Procedure(s) performed:   Procedures  None  ____________________________________________   INITIAL IMPRESSION / ASSESSMENT AND PLAN / ED COURSE  Pertinent labs & imaging results that were available during my care of the patient were reviewed by me and considered in my medical decision making (see chart for details).   Patient presents to the emergency department with COVID-19-like symptoms that are relatively mild at this point.  He has had symptoms for 3 days.  Denies chest pain but describes some soreness mainly with coughing.  EKG similar to his prior tracings.  May have some developing changes at the lung bases but no focal consolidation on chest x-ray.  He is not hypoxemic or displaying increased work of breathing at this time.  He is high risk given his unvaccinated status and age along with hypertension history.  He did test positive for Covid here.  Given his community delays and receiving monoclonal antibodies I have offered him monoclonal antibody therapy here after discussing the risks and benefits.  Patient has provided consent. Low suspicion for PE clinically. No CP at rest and only with cough. No hypoxemia or tachycardia.   MAB infusion tolerated well in the ED. Patient stable for D/C with close PCP follow up plan.  ____________________________________________  FINAL CLINICAL IMPRESSION(S) / ED DIAGNOSES  Final diagnoses:  COVID-19    MEDICATIONS GIVEN DURING THIS VISIT:  Medications  casirivimab-imdevimab (REGEN-COV) 1,200 mg in sodium chloride 0.9 % 110 mL IVPB (0 mg Intravenous Stopped 06/01/20 0421)   Note:  This document was prepared using Dragon voice recognition software and may  include unintentional dictation errors.  Alona Bene, MD, [facility] Emergency Medicine    Donivin Wirt, Arlyss Repress, MD 06/06/20 [phone]

## 2020-06-01 NOTE — Telephone Encounter (Addendum)
He should call the infusion clinic hotline at [phone].  He can leave them a message.  Depending on his status, he may be a candidate for infusion treatment.  Please give him routine ER cautions in the meantime.

## 2020-06-01 NOTE — ED Notes (Signed)
Pt at 94% RA in bed. During ambulation around room, pt remained between 93 and 94% on RA. Denied SOB, dizziness, or faintness during/after ambulation.

## 2020-06-01 NOTE — ED Notes (Signed)
E-signature pad unavailable at time of pt discharge. This RN discussed discharge materials with pt and answered all pt questions. Pt stated understanding of discharge material. ? ?

## 2020-06-05 ENCOUNTER — Telehealth: Payer: Self-pay

## 2020-06-05 NOTE — Telephone Encounter (Signed)
Spoke with pt and he stated that he had the infusion done last Thursday and he feels a little better.  Thank you,  Christy Gentles

## 2020-06-05 NOTE — Telephone Encounter (Signed)
Noted. Thanks.

## 2020-06-27 ENCOUNTER — Telehealth: Payer: Self-pay | Admitting: Family Medicine

## 2020-06-27 NOTE — Telephone Encounter (Signed)
Mr. Spiewak called in he stated that dr. Damita Dunnings told him if he needed help with his wife due to she has dementia to let him know if he needs help.Marland Kitchen and wanted to know his options

## 2020-06-28 NOTE — Telephone Encounter (Signed)
The options would be either getting extra help at home or placing her in a facility outside the home.  If there is a short-term need we may be able to get home health set up but that would not be around the clock care.  If they are thinking about placement in a facility I will fill out an [place] 2 form.  If she is clearly getting worse then I think it makes sense to get an office visit set up.

## 2020-06-29 DIAGNOSIS — H5203 Hypermetropia, bilateral: Secondary | ICD-10-CM | POA: Diagnosis not present

## 2020-06-29 DIAGNOSIS — H524 Presbyopia: Secondary | ICD-10-CM | POA: Diagnosis not present

## 2020-06-29 DIAGNOSIS — H43813 Vitreous degeneration, bilateral: Secondary | ICD-10-CM | POA: Diagnosis not present

## 2020-06-29 DIAGNOSIS — Z961 Presence of intraocular lens: Secondary | ICD-10-CM | POA: Diagnosis not present

## 2020-06-29 NOTE — Telephone Encounter (Signed)
Spoke with husband and advised on below and asked if we could schedule an OV appt with her. Right now she is in a rehab facility until Sunday. I advised husband to call back Monday after he determines what shape she will be in and go from there.

## 2020-06-29 NOTE — Telephone Encounter (Signed)
LMTCB

## 2020-07-10 ENCOUNTER — Encounter: Payer: Self-pay | Admitting: Family Medicine

## 2020-07-10 ENCOUNTER — Telehealth: Payer: Self-pay | Admitting: Family Medicine

## 2020-07-10 NOTE — Telephone Encounter (Signed)
His wife recently died.  Called and gave condolences.  He thanked me for the call.    Please call pt.  He needs OV re: his current meds.  He wanted to review/discuss at OV esp re: LUTS.  Would also need B12 at OV.  Please schedule when possible.  Thanks.

## 2020-07-17 ENCOUNTER — Encounter: Payer: Self-pay | Admitting: Family Medicine

## 2020-07-17 ENCOUNTER — Ambulatory Visit (INDEPENDENT_AMBULATORY_CARE_PROVIDER_SITE_OTHER): Payer: Medicare PPO | Admitting: Family Medicine

## 2020-07-17 ENCOUNTER — Other Ambulatory Visit: Payer: Self-pay

## 2020-07-17 VITALS — BP 142/80 | HR 66 | Temp 97.6°F | Ht 71.0 in | Wt 233.0 lb

## 2020-07-17 DIAGNOSIS — E538 Deficiency of other specified B group vitamins: Secondary | ICD-10-CM

## 2020-07-17 DIAGNOSIS — I10 Essential (primary) hypertension: Secondary | ICD-10-CM

## 2020-07-17 DIAGNOSIS — Z2821 Immunization not carried out because of patient refusal: Secondary | ICD-10-CM

## 2020-07-17 DIAGNOSIS — R399 Unspecified symptoms and signs involving the genitourinary system: Secondary | ICD-10-CM | POA: Diagnosis not present

## 2020-07-17 DIAGNOSIS — Z28310 Unvaccinated for covid-19: Secondary | ICD-10-CM

## 2020-07-17 MED ORDER — CYANOCOBALAMIN 1000 MCG/ML IJ SOLN
1000.0000 ug | Freq: Once | INTRAMUSCULAR | Status: AC
  Administered 2020-07-17: 1000 ug via INTRAMUSCULAR

## 2020-07-17 MED ORDER — DOXAZOSIN MESYLATE 1 MG PO TABS
2.0000 mg | ORAL_TABLET | Freq: Every day | ORAL | Status: DC
Start: 2020-07-17 — End: 2020-09-25

## 2020-07-17 NOTE — Patient Instructions (Addendum)
The old version of the shingles shot was called zostavax.  It was one dose.  The new version is shingrix and is two doses.   Check with your insurance to see if they will cover shingrix.    If you have more trouble urinating, then try taking 3 of the doxazodin tabs a day.  Be careful about getting lightheaded if you use it.  Don't add on the flomax yet.    Take care.  Glad to see you.  B12 shot today and again in 2 weeks.

## 2020-07-17 NOTE — Progress Notes (Signed)
This visit occurred during the SARS-CoV-2 public health emergency.  Safety protocols were in place, including screening questions prior to the visit, additional usage of staff PPE, and extensive cleaning of exam room while observing appropriate contact time as indicated for disinfecting solutions.  He thought he had zostavax in 2013 at pharmacy, d/w pt.  See avs.    His wife died of covid.  Condolences offered.  He had covid and needed ER eval.  Declined Covid vaccination at this point.  Discussed.  Still on zoloft at baseline.  "i'm okay" and he is trying to adjust to the changes at home after his wife died.  B12 def.  Due for dose today.  Discussed.  Hypertension:    Using medication without problems or lightheadedness: yes Chest pain with exertion:no Edema:no Short of breath:no LUTS had been controlled except for nocturia the last 2 nights.  No burning with urination.  He isn't on flomax at this point- cautions d/w pt about rhinorrhea and lightheadedness.  Not burning with urination.  No FCNAVD.    He had occ vertigo sx and he is careful about that at baseline, ie he doesn't lean over to change the oil in his car.     Meds, vitals, and allergies reviewed.   ROS: Per HPI unless specifically indicated in ROS section   GEN: nad, alert and oriented HEENT: NCAT NECK: supple w/o LA CV: rrr. PULM: ctab, no inc wob ABD: soft, +bs EXT: no edema SKIN: Well-perfused

## 2020-07-19 DIAGNOSIS — Z2831 Immunization not carried out because of patient refusal: Secondary | ICD-10-CM | POA: Insufficient documentation

## 2020-07-19 DIAGNOSIS — Z2821 Immunization not carried out because of patient refusal: Secondary | ICD-10-CM | POA: Insufficient documentation

## 2020-07-19 NOTE — Assessment & Plan Note (Signed)
Dose given.  Return periodically for injection here.

## 2020-07-19 NOTE — Assessment & Plan Note (Signed)
Encouraged.  Declined.

## 2020-07-19 NOTE — Assessment & Plan Note (Signed)
Increase doxazosin to 3 mg a day and update me if his blood pressures not controlled.  Routine cautions given to patient he agrees with plan.

## 2020-07-19 NOTE — Assessment & Plan Note (Signed)
Reasonable to increase doxazosin up to 3 mg.  He is not yet on Flomax.  We opted to increase his doxazosin instead of adding on/changing to Flomax.  Rationale discussed with patient.  He agrees.

## 2020-08-15 ENCOUNTER — Other Ambulatory Visit: Payer: Self-pay

## 2020-08-15 ENCOUNTER — Ambulatory Visit (INDEPENDENT_AMBULATORY_CARE_PROVIDER_SITE_OTHER): Payer: Medicare PPO | Admitting: *Deleted

## 2020-08-15 DIAGNOSIS — E538 Deficiency of other specified B group vitamins: Secondary | ICD-10-CM | POA: Diagnosis not present

## 2020-08-15 MED ORDER — CYANOCOBALAMIN 1000 MCG/ML IJ SOLN
1000.0000 ug | Freq: Once | INTRAMUSCULAR | Status: AC
  Administered 2020-08-15: 1000 ug via INTRAMUSCULAR

## 2020-08-15 NOTE — Progress Notes (Signed)
Per orders of Dr. Damita Dunnings, injection of B12 given by Tammi Sou. Patient tolerated injection well.

## 2020-08-29 ENCOUNTER — Other Ambulatory Visit: Payer: Self-pay

## 2020-08-29 ENCOUNTER — Ambulatory Visit (INDEPENDENT_AMBULATORY_CARE_PROVIDER_SITE_OTHER): Payer: Medicare PPO

## 2020-08-29 DIAGNOSIS — E538 Deficiency of other specified B group vitamins: Secondary | ICD-10-CM | POA: Diagnosis not present

## 2020-08-29 MED ORDER — CYANOCOBALAMIN 1000 MCG/ML IJ SOLN
1000.0000 ug | Freq: Once | INTRAMUSCULAR | Status: AC
  Administered 2020-08-29: 1000 ug via INTRAMUSCULAR

## 2020-08-29 NOTE — Progress Notes (Signed)
Per orders of Damita Dunnings, injection of B12, given by Aneta Mins, RN. Patient tolerated injection well in R Deltoid.

## 2020-09-12 ENCOUNTER — Other Ambulatory Visit: Payer: Self-pay

## 2020-09-12 ENCOUNTER — Ambulatory Visit (INDEPENDENT_AMBULATORY_CARE_PROVIDER_SITE_OTHER): Payer: Medicare PPO | Admitting: *Deleted

## 2020-09-12 DIAGNOSIS — E538 Deficiency of other specified B group vitamins: Secondary | ICD-10-CM | POA: Diagnosis not present

## 2020-09-12 MED ORDER — CYANOCOBALAMIN 1000 MCG/ML IJ SOLN
1000.0000 ug | Freq: Once | INTRAMUSCULAR | Status: AC
  Administered 2020-09-12: 1000 ug via INTRAMUSCULAR

## 2020-09-12 NOTE — Progress Notes (Signed)
Per orders of Dr. Damita Dunnings, injection of Vitamin B12 given by Lauralyn Primes. Patient tolerated injection well.

## 2020-09-25 ENCOUNTER — Other Ambulatory Visit: Payer: Self-pay

## 2020-09-25 ENCOUNTER — Encounter: Payer: Self-pay | Admitting: Family Medicine

## 2020-09-25 ENCOUNTER — Ambulatory Visit (INDEPENDENT_AMBULATORY_CARE_PROVIDER_SITE_OTHER): Payer: Medicare PPO | Admitting: Family Medicine

## 2020-09-25 VITALS — BP 138/82 | HR 61 | Temp 98.0°F | Ht 71.0 in | Wt 233.0 lb

## 2020-09-25 DIAGNOSIS — E538 Deficiency of other specified B group vitamins: Secondary | ICD-10-CM | POA: Diagnosis not present

## 2020-09-25 DIAGNOSIS — N62 Hypertrophy of breast: Secondary | ICD-10-CM | POA: Diagnosis not present

## 2020-09-25 MED ORDER — DOXAZOSIN MESYLATE 1 MG PO TABS: 3.0000 mg | ORAL_TABLET | Freq: Every day | ORAL | Status: DC

## 2020-09-25 MED ORDER — CYANOCOBALAMIN 1000 MCG/ML IJ SOLN
1000.0000 ug | Freq: Once | INTRAMUSCULAR | Status: AC
  Administered 2020-09-25: 1000 ug via INTRAMUSCULAR

## 2020-09-25 NOTE — Patient Instructions (Signed)
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  This can occasionally happen from some meds.  I would stop finasteride.  If you don't get better in the next few weeks or if you have more trouble with urination, then let me know.   We'll call about getting a mammogram and ultrasound.  Take care.  Glad to see you.

## 2020-09-25 NOTE — Progress Notes (Signed)
This visit occurred during the SARS-CoV-2 public health emergency.  Safety protocols were in place, including screening questions prior to the visit, additional usage of staff PPE, and extensive cleaning of exam room while observing appropriate contact time as indicated for disinfecting solutions.  R pectoral area sx.  Sore at the nipple.  Noted in the last few weeks.  No L sided pain.  Feels puffier at the R nipple.  No HA, no fevers.  No vision changes.  No new med changes.  No CP, not SOB.    Taking doxazosin 3mg  a day.  BP controlled.  Not lightheaded on standing.    Due for B12 injection.  Meds, vitals, and allergies reviewed.   ROS: Per HPI unless specifically indicated in ROS section   nad ncat Neck supple, no LA R sided gynecomastia and fullness at the nipple, normal exam on L side.  No axillary LA rrr ctab  Skin well perfused.  No rash.  30 minutes were devoted to patient care in this encounter (this includes time spent reviewing the patient's file/history, interviewing and examining the patient, counseling/reviewing plan with patient).

## 2020-09-26 ENCOUNTER — Ambulatory Visit: Payer: Medicare PPO

## 2020-09-27 ENCOUNTER — Telehealth: Payer: Self-pay | Admitting: Family Medicine

## 2020-09-27 DIAGNOSIS — N62 Hypertrophy of breast: Secondary | ICD-10-CM | POA: Insufficient documentation

## 2020-09-27 MED ORDER — DOXAZOSIN MESYLATE 1 MG PO TABS: 3.0000 mg | ORAL_TABLET | Freq: Every day | ORAL | 2 refills | Status: DC

## 2020-09-27 NOTE — Assessment & Plan Note (Signed)
B12 shot done at office visit.

## 2020-09-27 NOTE — Telephone Encounter (Signed)
Pt called in due to he is out of the [place] due to he is taking it 3 pills daily and when he went to get it filled they told him that its too early for him to get it filled.   Please advise

## 2020-09-27 NOTE — Assessment & Plan Note (Signed)
Multiple meds could cause this but finasteride is most likely to contribute.  Discussed with patient.  Would set up imaging to exclude malignancy and rationale discussed with patient.  He agrees.  Ordered.  Would stop finasteride the meantime and he will update me as needed.  See after visit summary.  He agrees to plan.

## 2020-09-27 NOTE — Telephone Encounter (Signed)
Called patient pharmacy to see why rx could not be picked up. They stated it was too early per rx on file and a new rx needs to be sent in with the increased dose. Pharmacy states once that is done rx should be able to be covered again. New rx has been sent in and patient has been notified.

## 2020-09-28 ENCOUNTER — Ambulatory Visit
Admission: RE | Admit: 2020-09-28 | Discharge: 2020-09-28 | Disposition: A | Discharge status: Home / Self Care | Payer: Medicare PPO | Source: Ambulatory Visit | Attending: Family Medicine | Admitting: Family Medicine

## 2020-09-28 ENCOUNTER — Other Ambulatory Visit: Payer: Self-pay

## 2020-09-28 DIAGNOSIS — N62 Hypertrophy of breast: Secondary | ICD-10-CM

## 2020-09-28 DIAGNOSIS — R928 Other abnormal and inconclusive findings on diagnostic imaging of breast: Secondary | ICD-10-CM | POA: Diagnosis not present

## 2020-10-02 ENCOUNTER — Telehealth: Payer: Self-pay | Admitting: Family Medicine

## 2020-10-02 DIAGNOSIS — N401 Enlarged prostate with lower urinary tract symptoms: Secondary | ICD-10-CM | POA: Diagnosis not present

## 2020-10-02 DIAGNOSIS — R8279 Other abnormal findings on microbiological examination of urine: Secondary | ICD-10-CM | POA: Diagnosis not present

## 2020-10-02 DIAGNOSIS — R3914 Feeling of incomplete bladder emptying: Secondary | ICD-10-CM | POA: Diagnosis not present

## 2020-10-02 NOTE — Telephone Encounter (Signed)
Richard King called in he stated that he is still not feeling good and the medication he is on is all messed up and he doesn't know what he is suppose to be taking, due to the urologist gave him a new prescription.

## 2020-10-02 NOTE — Telephone Encounter (Signed)
Patient states he saw urology today and his medications were changed and he is worried the urologist has his medications all wrong. I advised patient that if the urologist changed his medications then if most likely was what he needs to be on but we would still check with Dr. Damita Dunnings about changes. He was told to stop doxazosin and start silodosin 8 mg and dutasteride 0.5 mg. Patient is worried his dosage is all wrong since they are different then his other medications. I explained that some medications are higher or lower doses then other medications but he was not happy with that and wants Dr. Carole Civil opinion on these changes. I did advise patient we did not have any notes from them yet as well.

## 2020-10-03 NOTE — Telephone Encounter (Signed)
Called gave information to Tammy (on [place]). Will call urology and let us know if any issues.

## 2020-10-03 NOTE — Telephone Encounter (Signed)
I don't have the notes from urology yet so I can't comment.  He'll need to check with urology in the meantime.

## 2020-10-10 ENCOUNTER — Other Ambulatory Visit: Payer: Self-pay

## 2020-10-10 ENCOUNTER — Ambulatory Visit (INDEPENDENT_AMBULATORY_CARE_PROVIDER_SITE_OTHER): Payer: Medicare PPO

## 2020-10-10 ENCOUNTER — Telehealth: Payer: Self-pay

## 2020-10-10 DIAGNOSIS — E538 Deficiency of other specified B group vitamins: Secondary | ICD-10-CM | POA: Diagnosis not present

## 2020-10-10 MED ORDER — CYANOCOBALAMIN 1000 MCG/ML IJ SOLN
1000.0000 ug | Freq: Once | INTRAMUSCULAR | Status: AC
  Administered 2020-10-10: 1000 ug via INTRAMUSCULAR

## 2020-10-10 NOTE — Telephone Encounter (Signed)
Per chart review notes pt has already had B12 injection today.

## 2020-10-10 NOTE — Telephone Encounter (Signed)
[facility] Night - Client Nonclinical Telephone Record AccessNurse Client [facility] Night - Client Client Site [facility] Physician Renford Dills - MD Contact Type Call Who Is Calling Patient / Member / Family / Caregiver Caller Name McLaughlin Phone Number [phone] Patient Name Richard King Patient DOB 27-Jul-1940 Call Type Message Only Information Provided Reason for Call Request to Schedule Office Appointment Initial Comment Caller states at the office for b12 shot; ; Patient request to speak to RN No Additional Comment Avyay Coger 12/11/1940 [phone]; office is open/call transferred w/permission; Disp. Time Disposition Final User 10/10/2020 8:12:36 AM General Information Provided Yes Jerrye Beavers Call Closed By: Jerrye Beavers Transaction Date/Time: 10/10/2020 8:07:57 AM (ET)

## 2020-10-10 NOTE — Progress Notes (Signed)
Per orders of Dr. Damita Dunnings, injection of B12 given in L Deltoid by Randall An. Patient tolerated injection well.  Per Dr. Damita Dunnings, pt is to receive two more injections which should be Q2wks. Before the second injection, the pt should have a B12 lab and then the B12 injection can be administered.  Added B12 lab order.

## 2020-10-24 ENCOUNTER — Other Ambulatory Visit: Payer: Self-pay

## 2020-10-24 ENCOUNTER — Ambulatory Visit (INDEPENDENT_AMBULATORY_CARE_PROVIDER_SITE_OTHER): Payer: Medicare PPO

## 2020-10-24 DIAGNOSIS — E538 Deficiency of other specified B group vitamins: Secondary | ICD-10-CM

## 2020-10-24 MED ORDER — CYANOCOBALAMIN 1000 MCG/ML IJ SOLN
1000.0000 ug | Freq: Once | INTRAMUSCULAR | Status: AC
  Administered 2020-10-24: 1000 ug via INTRAMUSCULAR

## 2020-10-24 NOTE — Progress Notes (Signed)
Patient presented for B 12 injection given by Oneida Mckamey, CMA to right deltoid, patient voiced no concerns nor showed any signs of distress during injection.  

## 2020-11-07 ENCOUNTER — Other Ambulatory Visit: Payer: Self-pay

## 2020-11-07 ENCOUNTER — Ambulatory Visit (INDEPENDENT_AMBULATORY_CARE_PROVIDER_SITE_OTHER): Payer: Medicare PPO

## 2020-11-07 ENCOUNTER — Other Ambulatory Visit (INDEPENDENT_AMBULATORY_CARE_PROVIDER_SITE_OTHER): Payer: Medicare PPO

## 2020-11-07 DIAGNOSIS — E538 Deficiency of other specified B group vitamins: Secondary | ICD-10-CM

## 2020-11-07 LAB — VITAMIN B12: Vitamin B-12: 625 pg/mL (ref 211–911)

## 2020-11-07 MED ORDER — CYANOCOBALAMIN 1000 MCG/ML IJ SOLN
1000.0000 ug | Freq: Once | INTRAMUSCULAR | Status: AC
  Administered 2020-11-07: 1000 ug via INTRAMUSCULAR

## 2020-11-07 NOTE — Progress Notes (Signed)
Patient presented for B 12 injection given by Tayshaun Kroh, CMA to left deltoid, patient voiced no concerns nor showed any signs of distress during injection.  

## 2020-11-08 ENCOUNTER — Telehealth: Payer: Self-pay | Admitting: Orthopedic Surgery

## 2020-11-08 NOTE — Telephone Encounter (Signed)
Err

## 2020-11-09 ENCOUNTER — Encounter: Payer: Self-pay | Admitting: Family Medicine

## 2020-11-09 ENCOUNTER — Ambulatory Visit: Payer: Medicare PPO | Admitting: Family Medicine

## 2020-11-09 ENCOUNTER — Other Ambulatory Visit: Payer: Self-pay

## 2020-11-09 VITALS — BP 138/94 | HR 83 | Temp 96.4°F | Ht 71.0 in | Wt 232.0 lb

## 2020-11-09 DIAGNOSIS — N62 Hypertrophy of breast: Secondary | ICD-10-CM

## 2020-11-09 DIAGNOSIS — E538 Deficiency of other specified B group vitamins: Secondary | ICD-10-CM

## 2020-11-09 MED ORDER — VERAPAMIL HCL ER 120 MG PO TBCR: 120.0000 mg | EXTENDED_RELEASE_TABLET | Freq: Every day | ORAL | 3 refills | Status: DC

## 2020-11-09 NOTE — Patient Instructions (Addendum)
Keep B12 as is and recheck a level in about 3 months.    There was no sign of cancer on the imaging and it may continue to get better slowly.  Let me know if worse in the meantime.  Take care.  Glad to see you.

## 2020-11-09 NOTE — Progress Notes (Signed)
This visit occurred during the SARS-CoV-2 public health emergency.  Safety protocols were in place, including screening questions prior to the visit, additional usage of staff PPE, and extensive cleaning of exam room while observing appropriate contact time as indicated for disinfecting solutions.  B12 deficiency discussed with patient.  We talked about q14 day B12 dosing and recheck B12 level in about 3 months.    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  Still with fullness locally but not ttp o/w.  Not bigger, may be a little smaller and less sore now  Was changed to dutasteride and off finasteride now. He wasn't enthused about stopping dutasteride since it helped with nocturia, down to 2x/night.   Meds, vitals, and allergies reviewed.   ROS: Per HPI unless specifically indicated in ROS section   GEN: nad, alert and oriented HEENT: ncat NECK: supple w/o LA CV: rrr.  PULM: ctab, no inc wob, right-sided gynecomastia noted without any changes noted on the left breast. ABD: soft, +bs EXT: no edema SKIN: no acute rash

## 2020-11-12 NOTE — Assessment & Plan Note (Signed)
Keep B12 replacement as is and recheck a level in about 3 months.

## 2020-11-12 NOTE — Assessment & Plan Note (Signed)
Off finasteride.  On dutasteride.  Per urology. He may be some better. There was no sign of cancer on the imaging and it may continue to get better slowly.  He will let me know if worse in the meantime.

## 2020-11-21 ENCOUNTER — Other Ambulatory Visit: Payer: Self-pay

## 2020-11-21 ENCOUNTER — Ambulatory Visit (INDEPENDENT_AMBULATORY_CARE_PROVIDER_SITE_OTHER): Payer: Medicare PPO | Admitting: *Deleted

## 2020-11-21 DIAGNOSIS — E538 Deficiency of other specified B group vitamins: Secondary | ICD-10-CM | POA: Diagnosis not present

## 2020-11-21 MED ORDER — CYANOCOBALAMIN 1000 MCG/ML IJ SOLN
1000.0000 ug | Freq: Once | INTRAMUSCULAR | Status: AC
  Administered 2020-11-21: 1000 ug via INTRAMUSCULAR

## 2020-11-21 NOTE — Progress Notes (Signed)
Per orders of Dr. Damita Dunnings, injection of Vitamin B12 given by Lauralyn Primes. Patient tolerated injection well.

## 2020-11-22 ENCOUNTER — Ambulatory Visit: Payer: Medicare PPO

## 2020-12-05 ENCOUNTER — Other Ambulatory Visit: Payer: Self-pay

## 2020-12-05 ENCOUNTER — Ambulatory Visit (INDEPENDENT_AMBULATORY_CARE_PROVIDER_SITE_OTHER): Payer: Medicare PPO

## 2020-12-05 DIAGNOSIS — E538 Deficiency of other specified B group vitamins: Secondary | ICD-10-CM | POA: Diagnosis not present

## 2020-12-05 MED ORDER — CYANOCOBALAMIN 1000 MCG/ML IJ SOLN
1000.0000 ug | Freq: Once | INTRAMUSCULAR | Status: AC
  Administered 2020-12-05: 1000 ug via INTRAMUSCULAR

## 2020-12-05 NOTE — Progress Notes (Signed)
Patient presented for B 12 injection given by Jadalyn Oliveri, CMA to left deltoid, patient voiced no concerns nor showed any signs of distress during injection.  

## 2020-12-19 ENCOUNTER — Ambulatory Visit: Payer: Medicare PPO

## 2020-12-21 ENCOUNTER — Ambulatory Visit: Payer: Medicare PPO | Admitting: Family Medicine

## 2020-12-21 ENCOUNTER — Encounter: Payer: Self-pay | Admitting: Family Medicine

## 2020-12-21 ENCOUNTER — Other Ambulatory Visit: Payer: Self-pay

## 2020-12-21 VITALS — BP 140/72 | HR 70 | Temp 97.5°F | Ht 71.0 in | Wt 233.0 lb

## 2020-12-21 DIAGNOSIS — N62 Hypertrophy of breast: Secondary | ICD-10-CM

## 2020-12-21 DIAGNOSIS — M545 Low back pain, unspecified: Secondary | ICD-10-CM

## 2020-12-21 DIAGNOSIS — E538 Deficiency of other specified B group vitamins: Secondary | ICD-10-CM

## 2020-12-21 MED ORDER — CYANOCOBALAMIN 1000 MCG/ML IJ SOLN
1000.0000 ug | Freq: Once | INTRAMUSCULAR | Status: AC
  Administered 2020-12-21: 1000 ug via INTRAMUSCULAR

## 2020-12-21 NOTE — Patient Instructions (Signed)
B12 shot today- continue with B12 shots.  If you can put up with the breast changes, then don't change anything.  If more bothersome, then ask urology about changing/stopping dutasteride and see about other options through them.  Try ice vs heat on your back and update me as needed.   Take care.  Glad to see you.

## 2020-12-21 NOTE — Progress Notes (Signed)
This visit occurred during the SARS-CoV-2 public health emergency.  Safety protocols were in place, including screening questions prior to the visit, additional usage of staff PPE, and extensive cleaning of exam room while observing appropriate contact time as indicated for disinfecting solutions.  Lower back pain bilaterally.  Had been working in the yard, mowed for 1.5 hours recently.  No leg weakness.  No fevers.  No tylenol, hasn't tried ice or heat yet.    B12 def.  Due for injection.  Done at office visit.  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  Not on finasteride now.  R sided sx.  Some days more tender than others.  He and I were both concerned about LUTS if he stops med.  He can tolerate as is. He still has nocturia and he is putting up with that.    Meds, vitals, and allergies reviewed.   ROS: Per HPI unless specifically indicated in ROS section   Nad Ncat Neck supple.  No lymphadenopathy Rrr Ctab R gynecomastia noted.  Not noted on the left. Lower back not tender to palpation.  The affected area is the bilateral lower back but is not tender on exam.

## 2020-12-24 DIAGNOSIS — M545 Low back pain, unspecified: Secondary | ICD-10-CM | POA: Insufficient documentation

## 2020-12-24 NOTE — Assessment & Plan Note (Signed)
No alarming symptoms.  Discussed using ice/heat/stretching and update me as needed.  He agrees with plan.

## 2020-12-24 NOTE — Assessment & Plan Note (Signed)
If he can put up with the breast changes, then I would not change anything.  If more bothersome, then I want him to ask urology about changing/stopping dutasteride and see about other options through them.  Discussed.  He agrees.  No reason to suspect cancerous process given his benign imaging previously.

## 2020-12-24 NOTE — Assessment & Plan Note (Signed)
Continue with replacement. 

## 2021-01-01 DIAGNOSIS — R8279 Other abnormal findings on microbiological examination of urine: Secondary | ICD-10-CM | POA: Diagnosis not present

## 2021-01-01 DIAGNOSIS — N401 Enlarged prostate with lower urinary tract symptoms: Secondary | ICD-10-CM | POA: Diagnosis not present

## 2021-01-01 DIAGNOSIS — R351 Nocturia: Secondary | ICD-10-CM | POA: Diagnosis not present

## 2021-01-02 ENCOUNTER — Ambulatory Visit: Payer: Medicare PPO

## 2021-01-16 ENCOUNTER — Ambulatory Visit (INDEPENDENT_AMBULATORY_CARE_PROVIDER_SITE_OTHER): Payer: Medicare PPO

## 2021-01-16 ENCOUNTER — Other Ambulatory Visit: Payer: Self-pay

## 2021-01-16 DIAGNOSIS — E538 Deficiency of other specified B group vitamins: Secondary | ICD-10-CM

## 2021-01-16 MED ORDER — CYANOCOBALAMIN 1000 MCG/ML IJ SOLN
1000.0000 ug | Freq: Once | INTRAMUSCULAR | Status: AC
  Administered 2021-01-16: 1000 ug via INTRAMUSCULAR

## 2021-01-16 NOTE — Progress Notes (Signed)
Per orders of Dr. Elsie Stain, injection of B12 given by Francella Solian in left deltoid. Patient tolerated injection well. Patient will make appointment for next appointment. No questions if any patient will call office.

## 2021-01-19 DIAGNOSIS — R3914 Feeling of incomplete bladder emptying: Secondary | ICD-10-CM | POA: Diagnosis not present

## 2021-01-19 DIAGNOSIS — Z8551 Personal history of malignant neoplasm of bladder: Secondary | ICD-10-CM | POA: Diagnosis not present

## 2021-01-22 ENCOUNTER — Other Ambulatory Visit: Payer: Self-pay | Admitting: Urology

## 2021-01-23 ENCOUNTER — Other Ambulatory Visit: Payer: Self-pay | Admitting: Urology

## 2021-01-30 ENCOUNTER — Ambulatory Visit (INDEPENDENT_AMBULATORY_CARE_PROVIDER_SITE_OTHER): Payer: Medicare PPO

## 2021-01-30 ENCOUNTER — Other Ambulatory Visit: Payer: Self-pay

## 2021-01-30 DIAGNOSIS — E538 Deficiency of other specified B group vitamins: Secondary | ICD-10-CM | POA: Diagnosis not present

## 2021-01-30 MED ORDER — CYANOCOBALAMIN 1000 MCG/ML IJ SOLN
1000.0000 ug | Freq: Once | INTRAMUSCULAR | Status: AC
  Administered 2021-01-30: 1000 ug via INTRAMUSCULAR

## 2021-01-30 NOTE — Progress Notes (Signed)
Patient presented for B 12 injection given by Huldah Marin, CMA to right deltoid, patient voiced no concerns nor showed any signs of distress during injection.  

## 2021-01-31 ENCOUNTER — Other Ambulatory Visit: Payer: Self-pay

## 2021-01-31 ENCOUNTER — Encounter (HOSPITAL_BASED_OUTPATIENT_CLINIC_OR_DEPARTMENT_OTHER): Payer: Self-pay | Admitting: Urology

## 2021-01-31 NOTE — Progress Notes (Signed)
Spoke w/ via phone for pre-op interview--- pt Lab needs dos---- [employer] results------ current ekg in epic/ chart COVID test -----patient states asymptomatic;  pt aware to have covid test done 02-02-2021 since is overnight Arrive at ------- 0530 on 02-06-2021 NPO after MN NO Solid Food.  Clear liquids from MN until--- 0430 Med rec completed Medications to take morning of surgery ----- none Diabetic medication ----- n/a Patient instructed no nail polish to be worn day of surgery Patient instructed to bring photo id and insurance card day of surgery Patient aware to have Driver (ride ) / caregiver    for 24 hours after surgery --daughter, tammy norman Patient Special Instructions ----- reviewed rcc and visitor guidelines Pre-Op special Istructions ----- n/a Patient verbalized understanding of instructions that were given at this phone interview. Patient denies shortness of breath, chest pain, fever, cough at this phone interview.

## 2021-02-01 DIAGNOSIS — Z9289 Personal history of other medical treatment: Secondary | ICD-10-CM

## 2021-02-01 HISTORY — DX: Personal history of other medical treatment: Z92.89

## 2021-02-02 ENCOUNTER — Other Ambulatory Visit: Payer: Self-pay | Admitting: Urology

## 2021-02-02 LAB — SARS CORONAVIRUS 2 (TAT 6-24 HRS): SARS Coronavirus 2: NEGATIVE

## 2021-02-02 NOTE — H&P (Signed)
HPI: Richard King is a 80 year-old male established patient who is here for urinary retention.   01/12/2020: Patient with past history of the obstructive voiding symptoms on tamsulosin twice daily, and bladder cancer with most recent surveillance cystoscopy last fall indicating no evidence of disease.   He recently underwent left shoulder arthroscopy and developed postoperative urinary retention likely precipitated by constipation which has subsequently resolved. Presents today for possible trial of void.   Patient reports no problems with catheter urgency. He states this is been more of a nuisance for him but tolerating it well otherwise. Denies significant painful urgency or leaking around Catheter tubing, gross hematuria. Denies interval fevers/chills, nausea/vomiting. His constipation has resolved.   01/20/20: Patient with above-noted history. Unfortunately, he failed voiding trial at prior office visit return to clinic in the afternoon with PVR greater than 700. Indwelling catheter was replaced. He returns today for repeat voiding trial. He states his catheter has been draining appropriately. No complaints of gross hematuria or fevers. States he has been tolerating it, though is had some irritation at his urethral meatus. No complaints of painful urgency or leakage from around his catheter.   01/21/20: Patient underwent successful voiding trial yesterday in clinic. However, he presents today with complaints of small volume, frequent voids. He states he is voiding a small amount about every 30-45 minutes. He continues to feel his stream is weak. He is starting to have suprapubic pain and pressure, which has progressed over the evening hours. He denies dysuria or gross hematuria. No complaints of fevers or chills. He remains on tamsulosin 0.8 mg q.h.s..   His current symptoms did begin after he had a surgical procedure. The surgery he had done was left should arthroscopy. His urinary retention is being  treated with foley catheter and flomax.   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 He has been on tamsulosin 0.8 mg q.h.s. but had failed voiding trial on 01/21/2020. Patient was seen by nurse practitioner and scheduled for urodynamics. He is seen today because the catheter has caused irritation to his penis. The patient has not had urodynamics. Patient has also had history of bladder cancer in the remote past and needs follow-up surveillance as well.  -02/25/20-the patient with history of recent urinary retention as above. Underwent urodynamics which was reviewed today. This showed maximum detrusor pressure of 60 cm water with flow rate of 4 mL/second suggestive of obstructive voiding pattern. The patient was able to empty his bladder time of urodynamics but Foley was replaced as it was done at the end of the day before the weekend with fear that he might go into retention over the weekend. He is here now for follow-up cysto.    Cysto 2 performed today and shows normal pendulous urethra. Prostatic urethra shows bilobar prostatic hypertrophy with prostatic urethral length of about 3 cm. Bladder appeared grossly normal except for likely inflammatory response from indwelling Foley in the base the bladder. Also trabeculations noted. Patient was able to successfully void out the 200 cc that was instilled during his cystoscopy.   PROCEDURES:   Urodynamics - 51600, [id number], [id number], [id number], [id number]  URODYNAMICS STUDY   Test Indication: Retention  The procedure's risks, benefits and infection risk were discussed with the patient.   PRE UROFLOW & CATHETERIZATION  Procedure: Pre Uroflow Study  The patient did not void. Arrived with a 16 fr foley.  His catheter was removed, and a Urodynamic catheter was inserted.  CYSTOMETRY/CYSTOMETROGRAM  The bladder was filled with room temperature water at a rate of less than 50 cc per minute.   Injection of contrast was performed for the cystometrogram.  Max capacity was approx. 125 mls. First sensation occurred at 45 mls. Normal desire occurred at 47 mls. Strong desire occurred at 49 mls.   The bladder was unstable.  First unstable contraction occurred at 43 mls.  Max unstable detrusor contraction was 65 cmH20.  He leaked some and then voided with urgency off this unstable contraction. Filling was continued.   LEAK POINT PRESSURE  LPPs were assessed with pt in a seated position. Visualization was used to assess for SUI.  No leakage was noted with abdominal pressures of 100-108 cmH20.   PRESSURE FLOW STUDY  He was able to generate a voluntary contraction and void. His voiding pressures were high, along with an obstructed flow pattern.   Volume voided approx. 99 mls. Max flow was 4 ml/s.  Detrusor pressure at peak flow was 34 cmH20.  Max detrusor pressure was 60 cmH20.  PVR was approx. 23 mls.   ELECTROMYOGRAM  Activity was measured by surface electrodes  Intermittent increase in EMG leads was noted during voiding.   FLUOROSCOPY and VCUG  No X-ray - Pt has an Iodine allergy.   POST PROCEDURE ANTIBIOTICS:  No antibiotics were given post UDS. Urine was sent for culture. He was advised to watch for s/s of a break through UTI and to call our office or go to the ED if he develops any of them.   UDS SUMMARY  Richard King held a max capacity of approx. 125 mls. His 1st sensation was felt at 45 mls. There was positive instability, and his first void was with urgency off an unstable contraction. Filling was continued, and he was able to generate a voluntary contraction and void. His voiding pressures were high, along with an obstructed flow pattern. Intermittent increase in EMG activity was noted during voiding. PVR was approx. 23 mls. Because it is late in the day on a Friday, and the fact that he has had failed voiding trials in the past, I inserted a 16 fr foley after the study. He  will return next week for UDS f/u, sooner if needed.   03/02/2020: Follows up today for repeat exam. Successful trial of void at time of last office visit. He continues tamsulosin and finasteride as directed. Currently taking 0.8 mg tamsulosin at night and tolerating well. Over the past week he continues if we can intermittent stream, some mild hesitancy and straining as well. No longer having any burning or painful urination. He denies any interval blood in the urine. Nocturia has been variable. A few nights he did not get up at all to void but last night he got up 3-4 times but attributes this to drinking a lot of water prior to bedtime. No interval fevers or chills, nausea/vomiting. He expresses desire to continue finasteride to see if it has any notable affect on baseline voiding symptoms but is also interested in definitive outlet reduction procedure specifically mentioning TURP and Urolift.  : Acceptable bladder residual today. Urinalysis significantly improved from time of last assessment. No indication for further testing for underlying infectious process at this time especially with absence of painful bladder spasms, burning painful urination, visible blood in the urine. Nocturia has been variable over the past week but a lot of this is dependent on how much fluid he drinks prior to bedtime. We discussed fluid modification  today. He is going to continue tamsulosin 0.8 mg q.h.s. as well as daily finasteride. Patient would like to see if symptoms improve with continuing finasteride and understands it does take time for the medication to take any notable affect. I will refill tamsulosin today, he is tolerating 0.8 mg well without any noted side effect. Patient would like to discuss his candidacy for definitive outlet reduction procedure as well. I will have him back in 1 month for repeat exam per patient request Dr. Milford Cage. Appropriate return to clinic instructions discussed with worsening lower urinary  tract symptoms including frequency/urgency, painful inability to void, burning or painful urination, visible blood in the urine or correlating signs/symptoms of systemic infection.  -04/05/20-patient with history of BPH and remote history of bladder cancer(last documented recurrence in 2012) with negative recent cystoscopy in September of 2021. Has been on Doxazosin daily and finasteride was initiated in August of 2021. The patient had also had been on tamsulosin in the past but made him dizzy and PCP switched him to the doxazosin. He is now here for follow-up. In the interim the patient states that voiding has improved significantly since being on the finasteride. States that the urinary stream is much stronger. He continues to have nocturia but I think this is fluid mobilization as he has significant lower extremity edema.  Micro urinalysis shows greater than 60 WBCs with moderate bacteria and positive nitrites and is sent for culture today. Postvoid residual is 57 cc today  Urine is sent for culture today, initiated Keflex 500 mg t.i.d. as prescription management, disposition accordingly regarding antibiotic sensitivity. Recommended continuing on finasteride 5 mg daily and doxazosin that was prescribed by his PCP. He apparently had issues taking tamsulosin in the past. Will see back here in 6 months for follow-up PVR sooner if he has urinary issues. Will need annual cystoscopy for follow-up of his bladder cancer which will be in September of 2022. For   -10/02/20-patient with history of BPH managed with finasteride and doxazosin. 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  Micro urinalysis shows 40 60 WBCs and 320 RBCs and many bacteria this was sent for culture  Postvoid residual equals: 43 cc  -01/01/21-patient with history of BPH currently on silodosin 8 mg daily and dutasteride 0.5 mg daily. He has noted reasonable urinary stream  in terms of flow rate. He still continues to have problems with gynecomastia due to the dutasteride.  Micro urinalysis shows 20-40 WBCs 3-10 RBCs and many bacteria will be sent for culture  Postvoid residual equals: 20cc  Urine was sent for C&S, initiated Keflex 500 mg t.i.d. until C&S results available. I am going to see him back in a couple weeks finishing his antibiotics for cysto to assess prostate and bladder. Will likely need definitive procedure with cysto and TURP as he is intolerant of the 5 alpha reductase inhibitor due to gynecomastia.   -01/19/21-patient with history of BPH currently on silodosin and dutasteride. Has had some issues with gynecomastia being on the dutasteride. Is interested in definitive management for his outlet symptoms. Patient also with history remote history of bladder cancer which was originally diagnosed in July of 2011. Was stage T1 grade 3 at that time. 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 Patient also received maintenance therapy in February 2014. Has had no recurrence since  that time. Is due for surveillance cysto. Here for cysto to assess bladder/prostate.  Cysto he is performed today and shows: Mainly bilobar prostatic hypertrophy with about 3.5 cm prostatic urethral length. Bladder was inspected revealed 1 mild erythematous area in the base of the bladder which was non raised. The I feel like was low risk for recurrence.     ALLERGIES: Contrast Dye Iodine SOLN    MEDICATIONS: Dutasteride 0.5 mg capsule 1 capsule PO Daily  Sertraline Hcl 50 mg tablet  Silodosin 8 mg capsule 1 capsule PO Daily  Verapamil Er 240 mg tablet, extended release 1 Oral Daily     GU PSH: Complex cystometrogram, w/ void pressure and urethral pressure profile studies, any technique - 02/18/2020 Complex Uroflow - 02/18/2020 Cysto Bladder Ureth Biopsy -  2012 Cysto Uretero Lithotripsy - 2011 Cystoscopy - 02/25/2020, 03/16/2019, 2019, 2019, 2018, 2017 Cystoscopy Insert Stent - 2011 Cystoscopy TURBT >5 cm - 2011 Cystoscopy TURBT 2-5 cm - 2011 Emg surf Electrd - 02/18/2020 ESWL - 2011, 2008 Inject For cystogram - 02/18/2020 Intrabd voidng Press - 02/18/2020 Locm 300-'399Mg'$ /Ml Iodine,1Ml - 03/23/2019       PSH Notes: Cystoscopy With Biopsy, Cystoscopy With Fulguration Medium Lesion (2-5cm), Cystoscopy With Fulguration Large Lesion (Over 5cm), Cystoscopy With Ureteroscopy With Lithotripsy, Cystoscopy With Insertion Of Ureteral Stent Right, Lithotripsy, Surgery Of Tongue / Floor Of Mouth, Lithotripsy, Foot Surgery   NON-GU PSH: Anesth, Shoulder Replacement, Left Tongue And Mouth Surgery - 2009     GU PMH: BPH w/LUTS - 01/01/2021, - 10/02/2020, - 04/05/2020, - 03/02/2020, - 02/25/2020, Benign prostatic hyperplasia with urinary obstruction, - 2016 Incomplete bladder emptying - 10/02/2020 Acute Cystitis/UTI - 04/05/2020 History of bladder cancer - 04/05/2020, (Stable), He had no evidence of recurrent transitional cell carcinoma of the bladder today. He will tentatively be scheduled for follow-up in 1 year for surveillance cystoscopy., - 03/16/2019 (Stable), His bladder was clear of any recurrence. I have recommended continued yearly surveillance cystoscopy., - 2019, History of bladder cancer, - 2016 Nocturia (Stable) - 04/05/2020, (Stable), - 2018 (Stable), - 2017, Nocturia, - 2014 Urinary Frequency - 04/05/2020 Weak Urinary Stream - 03/02/2020, (Stable), - 2018, Weak urinary stream, - 2016 Urinary Retention - 02/25/2020, - 02/18/2020, - 01/21/2020, - 01/20/2020, Prior to his recent shoulder surgery, he indicated to me he was voiding at his baseline with no new or worsening symptomatology including absence of gross hematuria, - 01/12/2020 Renal calculus (Stable), He has a known right-sided stone burden that was stable on CT imaging performed last year. CT imaging also  indicated no concerning GU lesion or abnormality considering his past history bladder cancer. - 01/12/2020, Nephrolithiasis, - 2016 Encounter for Prostate Cancer screening, He does not recall having a PSA done in some time. I will obtain a screening PSA today. - 03/16/2019 Microscopic hematuria, He was found to have microscopic hematuria today. No abnormality was noted within the bladder but he has a history of CIS so his urine will be sent for cytology and further evaluation of his upper tract will be undertaken with a CT scan. - 03/16/2019 BPH w/o LUTS (Stable), I did note BPH today cystoscopically. He would likely benefit from finasteride if it is necessary to treat his intermittent prostatic bleeding in the future, from the standpoint of his voiding. - 2019 Gross hematuria, His gross hematuria was not due to any recurrent transitional cell carcinoma and he was quite pleased to learn that that was the case. It is prostatic in origin and we did discuss  the off-label use of finasteride for this if it continues to be a problem. For now I am going to hold off on this medication but if he continues to have recurrent bleeding that would be the next step. - 2019, (Stable), His gross hematuria may have been from the passage of a small stone although he had no pain. It may have come from his prostate which is enlarged but I found no abnormality of the upper tract by CT scan and no evidence of recurrent transitional cell carcinoma the bladder., - 2019, Gross hematuria, - 2016 Renal cyst, Right, His CT scan revealed a simple cyst in the right kidney. This is of no clinical significance. - 2019 Bladder Cancer, Unspec - 2019, Bladder cancer, - 2015 Other microscopic hematuria, Microscopic hematuria - 2016 CIS of the bladder, Carcinoma in situ of bladder - 2015 Bladder Stone, Bladder calculus - 2015 ED due to arterial insufficiency, Erectile dysfunction due to arterial insufficiency - 2014 Hematuria, Unspec, Blood in  urine - 2014 Low back pain, Lower back pain - 2014 Ureteral calculus, Calculus of ureter - 2014 Urinary Tract Inf, Unspec site, Pyuria - 2014      PMH Notes: Bladder cancer: He was incidentally found to have a bladder tumor at the time of ureteroscopy and underwent TURBT in 8/11.  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  Repeat bladder biopsy 9/11: Negative.  Treatment: Induction course of BCG completed in 12/11.  Repeat bladder biopsies 5/12: Positive for CIS from the right wall.  Treatment: Second induction course of BCG completed 8/12.  1/14 CT: There was no evidence of any upper tract tumor, hydronephrosis or obvious bladder mass.   BPH with outlet obstruction: He has visual obstruction from BPH cystoscopically with a slow progression of voiding symptoms including nocturia and a weakened urinary stream.   History of nephrolithiasis: He underwent ESWL in 7/11 and subsequently required ureteroscopy for obstructing fragments.  1/14 CT: bilateral nonobstructing renal calculi. The largest was 8 mm in the right lower pole.     NON-GU PMH: Pyuria/other UA findings (Stable) - 01/01/2021, - 10/02/2020 Encounter for general adult medical examination without abnormal findings, Encounter for preventive health examination - 2014 Personal history of other diseases of the circulatory system, History of hypertension - 2014    FAMILY HISTORY: Family Health Status Number - Runs In Family Hypertension - Runs In Family nephrolithiasis - Father   SOCIAL HISTORY: Marital Status: Married Preferred Language: English; Ethnicity: Not Hispanic Or Latino; Race: White Current Smoking Status: Patient does not smoke anymore. Has not smoked since 03/03/1981.  Does not use smokeless tobacco. Has never drank.  Does not use drugs. Drinks 2 caffeinated drinks per day. Has not had a blood transfusion. Patient's occupation is/was Retired.     REVIEW OF SYSTEMS:    GU Review Male:   Patient reports get up at night to urinate. Patient denies frequent urination, hard to postpone urination, burning/ pain with urination, leakage of urine, stream starts and stops, trouble starting your stream, have to strain to urinate , erection problems, and penile pain.  Gastrointestinal (Upper):   Patient denies nausea, vomiting, and indigestion/ heartburn.  Gastrointestinal (Lower):   Patient reports constipation. Patient denies diarrhea.  Constitutional:   Patient denies fever, night sweats, weight loss, and fatigue.  Skin:   Patient denies skin rash/ lesion and itching.  Eyes:   Patient denies blurred vision and double vision.  Ears/ Nose/ Throat:  Patient denies sore throat and sinus problems.  Hematologic/Lymphatic:   Patient denies swollen glands and easy bruising.  Cardiovascular:   Patient denies leg swelling and chest pains.  Respiratory:   Patient denies cough and shortness of breath.  Endocrine:   Patient denies excessive thirst.  Musculoskeletal:   Patient denies back pain and joint pain.  Neurological:   Patient denies headaches and dizziness.  Psychologic:   Patient denies depression and anxiety.   VITAL SIGNS: None   GU PHYSICAL EXAMINATION:    Urethral Meatus: Normal size. No lesion, no wart, no discharge, no polyp. Normal location.  Penis: Circumcised, no warts, no cracks. No dorsal Peyronie's plaques, no left corporal Peyronie's plaques, no right corporal Peyronie's plaques, no scarring, no warts. No balanitis, no meatal stenosis.   MULTI-SYSTEM PHYSICAL EXAMINATION:    Constitutional: Well-nourished. No physical deformities. Normally developed. Good grooming.  Neck: Neck symmetrical, not swollen. Normal tracheal position.  Respiratory: No labored breathing, no use of accessory muscles.   Cardiovascular: Normal temperature, normal extremity pulses, no swelling, no varicosities.  Lymphatic: No enlargement of neck, axillae,  groin.  Skin: No paleness, no jaundice, no cyanosis. No lesion, no ulcer, no rash.  Neurologic / Psychiatric: Oriented to time, oriented to place, oriented to person. No depression, no anxiety, no agitation.  Eyes: Normal conjunctivae. Normal eyelids.  Ears, Nose, Mouth, and Throat: Left ear no scars, no lesions, no masses. Right ear no scars, no lesions, no masses. Nose no scars, no lesions, no masses. Normal hearing. Normal lips.  Musculoskeletal: Normal gait and station of head and neck.     Complexity of Data:   03/16/19 07/11/11 01/12/09 01/06/08 12/03/06 10/30/05 11/21/04 06/15/03  PSA  Total PSA 2.23 ng/mL 1.76  2.92  2.23  1.69  1.96  2.32  1.45     PROCEDURES:         Flexible Cystoscopy - 52000  Risks, benefits, and some of the potential complications of the procedure were discussed at length with the patient including infection, bleeding, voiding discomfort, urinary retention, fever, chills, sepsis, and others. All questions were answered. Informed consent was obtained. Antibiotic prophylaxis was given. Sterile technique and intraurethral analgesia were used.  Meatus:  Normal size. Normal location. Normal condition.  Urethra:  No strictures.  External Sphincter:  Normal.  Verumontanum:  Normal.  Prostate:  Non-obstructing. No hyperplasia.  Bladder Neck:  Non-obstructing.  Ureteral Orifices:  Normal location. Normal size. Normal shape. Effluxed clear urine.  Bladder:  Cysto he is performed today and shows: Mainly bilobar prostatic hypertrophy with about 3.5 cm prostatic urethral length. Bladder was inspected revealed 1 mild erythematous area in the base of the bladder which was non raised. The I feel like was low risk for recurrence      The lower urinary tract was carefully examined. The procedure was well-tolerated and without complications. Antibiotic instructions were given. Instructions were given to call the office immediately for bloody urine, difficulty urinating, urinary  retention, painful or frequent urination, fever, chills, nausea, vomiting or other illness. The patient stated that he understood these instructions and would comply with them.         Urinalysis w/Scope Dipstick Dipstick Cont'd Micro  Color: Amber Bilirubin: Neg mg/dL WBC/hpf: 0 - 5/hpf  Appearance: Clear Ketones: Neg mg/dL RBC/hpf: 3 - 10/hpf  Specific Gravity: 1.020 Blood: 1+ ery/uL Bacteria: Rare (0-9/hpf)  pH: 5.5 Protein: Neg mg/dL Cystals: NS (Not Seen)  Glucose: Neg mg/dL Urobilinogen: 0.2 mg/dL Casts: NS (Not Seen)  Nitrites: Neg Trichomonas: Not Present    Leukocyte Esterase: Neg leu/uL Mucous: Present      Epithelial Cells: 6 - 10/hpf      Yeast: NS (Not Seen)      Sperm: Not Present    ASSESSMENT:      ICD-10 Details  1 GU:   BPH w/LUTS - N40.1 Chronic, Stable  2   History of bladder cancer - Z85.51 Chronic, Stable  3   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 He does have significant bilobar prostatic hypertrophy and although symptoms are fairly well controlled he is having significant gynecomastia issues utilizing the dutasteride. He wants to proceed with cysto and TURP for definitive management for BPH in I will schedule accordingly in the near future. Risks and benefits discussed as outlined below.  I have discussed with the patient the risks, benefits and alternatives of trans urethral resection of prostate which includes but is not limited to: Bleeding, sometimes requiring transfusion of blood, TUR syndrome, prolonged Foley catheter drainage, infection, urinary incontinence, urinary retention, damage to surrounding organs, need for possible additional procedures, possibility of nonhealing area leading to recurrent hematuria, retrograde ejaculation, erectile dysfunction, and urgency and frequency which can be  refractory to medication,. Typically the hospital stay will be overnight with a catheter in place up to a week. The patient's expected recovery. With irritative voiding symptoms and intermittent hematuria for up to a month. The patient voices understanding of the risks and benefits of the TURP procedure and consents to proceed

## 2021-02-05 NOTE — Anesthesia Preprocedure Evaluation (Addendum)
Anesthesia Evaluation  Patient identified by MRN, date of birth, ID band Patient awake    Reviewed: Allergy & Precautions, NPO status , Patient's Chart, lab work & pertinent test results  History of Anesthesia Complications Negative for: history of anesthetic complications  Airway Mallampati: II  TM Distance: >3 FB Neck ROM: Full    Dental  (+) Dental Advisory Given, Teeth Intact   Pulmonary former smoker,    Pulmonary exam normal        Cardiovascular hypertension, Pt. on medications  Rhythm:Regular Rate:Bradycardia     Neuro/Psych  BPPV  negative psych ROS   GI/Hepatic negative GI ROS, Neg liver ROS,   Endo/Other   Obesity    Renal/GU negative Renal ROS    BPH     Musculoskeletal  (+) Arthritis , Osteoarthritis,    Abdominal   Peds  Hematology negative hematology ROS (+)   Anesthesia Other Findings Covid test negative   Reproductive/Obstetrics                            Anesthesia Physical Anesthesia Plan  ASA: 2  Anesthesia Plan: General   Post-op Pain Management:    Induction: Intravenous  PONV Risk Score and Plan: 2 and Treatment may vary due to age or medical condition and Ondansetron  Airway Management Planned: LMA  Additional Equipment: None  Intra-op Plan:   Post-operative Plan: Extubation in OR  Informed Consent: I have reviewed the patients History and Physical, chart, labs and discussed the procedure including the risks, benefits and alternatives for the proposed anesthesia with the patient or authorized representative who has indicated his/her understanding and acceptance.     Dental advisory given  Plan Discussed with: CRNA and Anesthesiologist  Anesthesia Plan Comments:        Anesthesia Quick Evaluation

## 2021-02-06 ENCOUNTER — Encounter (HOSPITAL_BASED_OUTPATIENT_CLINIC_OR_DEPARTMENT_OTHER): Payer: Self-pay | Admitting: Urology

## 2021-02-06 ENCOUNTER — Ambulatory Visit (HOSPITAL_BASED_OUTPATIENT_CLINIC_OR_DEPARTMENT_OTHER): Payer: Medicare PPO | Admitting: Anesthesiology

## 2021-02-06 ENCOUNTER — Other Ambulatory Visit: Payer: Self-pay

## 2021-02-06 ENCOUNTER — Other Ambulatory Visit (HOSPITAL_COMMUNITY): Payer: Medicare PPO

## 2021-02-06 ENCOUNTER — Ambulatory Visit (HOSPITAL_BASED_OUTPATIENT_CLINIC_OR_DEPARTMENT_OTHER)
Admission: RE | Admit: 2021-02-06 | Discharge: 2021-02-07 | Disposition: A | Discharge status: Home / Self Care | Payer: Medicare PPO | Attending: Urology | Admitting: Urology

## 2021-02-06 ENCOUNTER — Encounter (HOSPITAL_COMMUNITY)
Admission: RE | Disposition: A | Discharge status: Home / Self Care | Payer: Self-pay | Source: Home / Self Care | Attending: Urology

## 2021-02-06 DIAGNOSIS — Z87891 Personal history of nicotine dependence: Secondary | ICD-10-CM | POA: Insufficient documentation

## 2021-02-06 DIAGNOSIS — Z8551 Personal history of malignant neoplasm of bladder: Secondary | ICD-10-CM | POA: Insufficient documentation

## 2021-02-06 DIAGNOSIS — N329 Bladder disorder, unspecified: Secondary | ICD-10-CM | POA: Diagnosis not present

## 2021-02-06 DIAGNOSIS — Z96612 Presence of left artificial shoulder joint: Secondary | ICD-10-CM | POA: Diagnosis not present

## 2021-02-06 DIAGNOSIS — Z91041 Radiographic dye allergy status: Secondary | ICD-10-CM | POA: Insufficient documentation

## 2021-02-06 DIAGNOSIS — R338 Other retention of urine: Secondary | ICD-10-CM | POA: Diagnosis not present

## 2021-02-06 DIAGNOSIS — N138 Other obstructive and reflux uropathy: Secondary | ICD-10-CM | POA: Diagnosis not present

## 2021-02-06 DIAGNOSIS — I10 Essential (primary) hypertension: Secondary | ICD-10-CM | POA: Diagnosis not present

## 2021-02-06 DIAGNOSIS — I959 Hypotension, unspecified: Secondary | ICD-10-CM | POA: Insufficient documentation

## 2021-02-06 DIAGNOSIS — I442 Atrioventricular block, complete: Secondary | ICD-10-CM | POA: Diagnosis not present

## 2021-02-06 DIAGNOSIS — N401 Enlarged prostate with lower urinary tract symptoms: Secondary | ICD-10-CM | POA: Diagnosis not present

## 2021-02-06 DIAGNOSIS — Z885 Allergy status to narcotic agent status: Secondary | ICD-10-CM | POA: Diagnosis not present

## 2021-02-06 DIAGNOSIS — N3080 Other cystitis without hematuria: Secondary | ICD-10-CM | POA: Diagnosis not present

## 2021-02-06 DIAGNOSIS — Z87442 Personal history of urinary calculi: Secondary | ICD-10-CM | POA: Insufficient documentation

## 2021-02-06 DIAGNOSIS — Z79899 Other long term (current) drug therapy: Secondary | ICD-10-CM | POA: Insufficient documentation

## 2021-02-06 DIAGNOSIS — I97191 Other postprocedural cardiac functional disturbances following other surgery: Secondary | ICD-10-CM | POA: Diagnosis not present

## 2021-02-06 DIAGNOSIS — Y838 Other surgical procedures as the cause of abnormal reaction of the patient, or of later complication, without mention of misadventure at the time of the procedure: Secondary | ICD-10-CM | POA: Diagnosis not present

## 2021-02-06 DIAGNOSIS — R3912 Poor urinary stream: Secondary | ICD-10-CM | POA: Insufficient documentation

## 2021-02-06 DIAGNOSIS — Z888 Allergy status to other drugs, medicaments and biological substances status: Secondary | ICD-10-CM | POA: Diagnosis not present

## 2021-02-06 DIAGNOSIS — Z8616 Personal history of COVID-19: Secondary | ICD-10-CM | POA: Diagnosis not present

## 2021-02-06 DIAGNOSIS — R3914 Feeling of incomplete bladder emptying: Secondary | ICD-10-CM | POA: Diagnosis not present

## 2021-02-06 DIAGNOSIS — H8110 Benign paroxysmal vertigo, unspecified ear: Secondary | ICD-10-CM | POA: Insufficient documentation

## 2021-02-06 DIAGNOSIS — N3030 Trigonitis without hematuria: Secondary | ICD-10-CM | POA: Diagnosis not present

## 2021-02-06 DIAGNOSIS — N400 Enlarged prostate without lower urinary tract symptoms: Secondary | ICD-10-CM | POA: Diagnosis not present

## 2021-02-06 HISTORY — DX: Unspecified osteoarthritis, unspecified site: M19.90

## 2021-02-06 HISTORY — DX: Personal history of malignant neoplasm of bladder: Z85.51

## 2021-02-06 HISTORY — DX: Personal history of adenomatous and serrated colon polyps: Z86.0101

## 2021-02-06 HISTORY — DX: Benign prostatic hyperplasia with lower urinary tract symptoms: N40.1

## 2021-02-06 HISTORY — DX: Benign paroxysmal vertigo, unspecified ear: H81.10

## 2021-02-06 HISTORY — DX: Personal history of colonic polyps: Z86.010

## 2021-02-06 HISTORY — PX: CYSTOSCOPY: SHX5120

## 2021-02-06 HISTORY — DX: Other complications of anesthesia, initial encounter: T88.59XA

## 2021-02-06 HISTORY — PX: TRANSURETHRAL RESECTION OF PROSTATE: SHX73

## 2021-02-06 LAB — POCT I-STAT, CHEM 8
BUN: 15 mg/dL (ref 8–23)
Calcium, Ion: 1.34 mmol/L (ref 1.15–1.40)
Chloride: 106 mmol/L (ref 98–111)
Creatinine, Ser: 0.8 mg/dL (ref 0.61–1.24)
Glucose, Bld: 98 mg/dL (ref 70–99)
HCT: 47 % (ref 39.0–52.0)
Hemoglobin: 16 g/dL (ref 13.0–17.0)
Potassium: 3.7 mmol/L (ref 3.5–5.1)
Sodium: 143 mmol/L (ref 135–145)
TCO2: 24 mmol/L (ref 22–32)

## 2021-02-06 SURGERY — TURP (TRANSURETHRAL RESECTION OF PROSTATE)
Anesthesia: General | Site: Bladder

## 2021-02-06 MED ORDER — SODIUM CHLORIDE 0.9 % IV SOLN: INTRAVENOUS | Status: DC

## 2021-02-06 MED ORDER — EPHEDRINE 5 MG/ML INJ
INTRAVENOUS | Status: AC
  Filled 2021-02-06: qty 5

## 2021-02-06 MED ORDER — SERTRALINE HCL 50 MG PO TABS
50.0000 mg | ORAL_TABLET | Freq: Every day | ORAL | Status: DC
  Administered 2021-02-06: 50 mg via ORAL
  Filled 2021-02-06: qty 1

## 2021-02-06 MED ORDER — GLYCOPYRROLATE 0.2 MG/ML IJ SOLN
INTRAMUSCULAR | Status: DC | PRN
  Administered 2021-02-06: .1 mg via INTRAVENOUS

## 2021-02-06 MED ORDER — LIDOCAINE 2% (20 MG/ML) 5 ML SYRINGE
INTRAMUSCULAR | Status: DC | PRN
  Administered 2021-02-06: 60 mg via INTRAVENOUS

## 2021-02-06 MED ORDER — DEXAMETHASONE SODIUM PHOSPHATE 10 MG/ML IJ SOLN
INTRAMUSCULAR | Status: AC
  Filled 2021-02-06: qty 1

## 2021-02-06 MED ORDER — SODIUM CHLORIDE 0.9 % IR SOLN
Status: DC | PRN
  Administered 2021-02-06: 3000 mL via INTRAVESICAL
  Administered 2021-02-06: 6000 mL
  Administered 2021-02-06 (×2): 3000 mL via INTRAVESICAL
  Administered 2021-02-06: 6000 mL via INTRAVESICAL

## 2021-02-06 MED ORDER — PROPOFOL 10 MG/ML IV BOLUS
INTRAVENOUS | Status: DC | PRN
  Administered 2021-02-06: 140 mg via INTRAVENOUS

## 2021-02-06 MED ORDER — DUTASTERIDE 0.5 MG PO CAPS
0.5000 mg | ORAL_CAPSULE | Freq: Every day | ORAL | Status: DC
  Administered 2021-02-06: 0.5 mg via ORAL
  Filled 2021-02-06: qty 1

## 2021-02-06 MED ORDER — AMISULPRIDE (ANTIEMETIC) 5 MG/2ML IV SOLN: 10.0000 mg | Freq: Once | INTRAVENOUS | Status: DC | PRN

## 2021-02-06 MED ORDER — ONDANSETRON HCL 4 MG/2ML IJ SOLN
INTRAMUSCULAR | Status: DC | PRN
  Administered 2021-02-06: 4 mg via INTRAVENOUS

## 2021-02-06 MED ORDER — FENTANYL CITRATE (PF) 100 MCG/2ML IJ SOLN
INTRAMUSCULAR | Status: DC | PRN
  Administered 2021-02-06: 25 ug via INTRAVENOUS
  Administered 2021-02-06: 50 ug via INTRAVENOUS

## 2021-02-06 MED ORDER — OXYBUTYNIN CHLORIDE 5 MG PO TABS: 5.0000 mg | ORAL_TABLET | Freq: Three times a day (TID) | ORAL | Status: DC | PRN

## 2021-02-06 MED ORDER — LIDOCAINE 2% (20 MG/ML) 5 ML SYRINGE
INTRAMUSCULAR | Status: AC
  Filled 2021-02-06: qty 5

## 2021-02-06 MED ORDER — ONDANSETRON HCL 4 MG/2ML IJ SOLN: 4.0000 mg | INTRAMUSCULAR | Status: DC | PRN

## 2021-02-06 MED ORDER — ACETAMINOPHEN 325 MG PO TABS: 650.0000 mg | ORAL_TABLET | ORAL | Status: DC | PRN

## 2021-02-06 MED ORDER — SODIUM CHLORIDE 0.9 % IR SOLN
3000.0000 mL | Status: DC
  Administered 2021-02-06 (×2): 3000 mL

## 2021-02-06 MED ORDER — PHENOL 1.4 % MT LIQD: 1.0000 | OROMUCOSAL | Status: DC | PRN

## 2021-02-06 MED ORDER — FENTANYL CITRATE (PF) 100 MCG/2ML IJ SOLN: 25.0000 ug | INTRAMUSCULAR | Status: DC | PRN

## 2021-02-06 MED ORDER — CHLORHEXIDINE GLUCONATE CLOTH 2 % EX PADS
6.0000 | MEDICATED_PAD | Freq: Every day | CUTANEOUS | Status: DC
  Administered 2021-02-07: 6 via TOPICAL

## 2021-02-06 MED ORDER — MELATONIN 3 MG PO TABS
3.0000 mg | ORAL_TABLET | Freq: Every day | ORAL | Status: DC
  Administered 2021-02-06: 3 mg via ORAL
  Filled 2021-02-06: qty 1

## 2021-02-06 MED ORDER — CEFAZOLIN SODIUM-DEXTROSE 2-4 GM/100ML-% IV SOLN
INTRAVENOUS | Status: AC
  Filled 2021-02-06: qty 100

## 2021-02-06 MED ORDER — MENTHOL 3 MG MT LOZG: 1.0000 | LOZENGE | OROMUCOSAL | Status: DC | PRN

## 2021-02-06 MED ORDER — PROPOFOL 500 MG/50ML IV EMUL
INTRAVENOUS | Status: AC
  Filled 2021-02-06: qty 50

## 2021-02-06 MED ORDER — OXYBUTYNIN CHLORIDE 5 MG PO TABS
5.0000 mg | ORAL_TABLET | Freq: Three times a day (TID) | ORAL | Status: DC | PRN
  Administered 2021-02-06: 5 mg via ORAL

## 2021-02-06 MED ORDER — HYDRALAZINE HCL 10 MG PO TABS
10.0000 mg | ORAL_TABLET | Freq: Two times a day (BID) | ORAL | Status: DC
  Administered 2021-02-07: 10 mg via ORAL
  Filled 2021-02-06: qty 1

## 2021-02-06 MED ORDER — STERILE WATER FOR IRRIGATION IR SOLN
Status: DC | PRN
  Administered 2021-02-06: 500 mL

## 2021-02-06 MED ORDER — LACTATED RINGERS IV SOLN: INTRAVENOUS | Status: DC

## 2021-02-06 MED ORDER — CEFAZOLIN SODIUM-DEXTROSE 2-4 GM/100ML-% IV SOLN
2.0000 g | INTRAVENOUS | Status: AC
  Administered 2021-02-06: 2 g via INTRAVENOUS

## 2021-02-06 MED ORDER — FENTANYL CITRATE (PF) 100 MCG/2ML IJ SOLN
INTRAMUSCULAR | Status: AC
  Filled 2021-02-06: qty 2

## 2021-02-06 MED ORDER — SODIUM CHLORIDE 0.9 % IR SOLN: 3000.0000 mL | Status: DC

## 2021-02-06 MED ORDER — ONDANSETRON HCL 4 MG/2ML IJ SOLN
INTRAMUSCULAR | Status: AC
  Filled 2021-02-06: qty 2

## 2021-02-06 MED ORDER — DEXAMETHASONE SODIUM PHOSPHATE 10 MG/ML IJ SOLN
INTRAMUSCULAR | Status: DC | PRN
  Administered 2021-02-06: 4 mg via INTRAVENOUS

## 2021-02-06 MED ORDER — EPHEDRINE SULFATE-NACL 50-0.9 MG/10ML-% IV SOSY
PREFILLED_SYRINGE | INTRAVENOUS | Status: DC | PRN
  Administered 2021-02-06 (×4): 10 mg via INTRAVENOUS
  Administered 2021-02-06: 15 mg via INTRAVENOUS

## 2021-02-06 MED ORDER — GLYCOPYRROLATE PF 0.2 MG/ML IJ SOSY
PREFILLED_SYRINGE | INTRAMUSCULAR | Status: AC
  Filled 2021-02-06: qty 1

## 2021-02-06 MED ORDER — OXYBUTYNIN CHLORIDE 5 MG PO TABS
ORAL_TABLET | ORAL | Status: AC
  Filled 2021-02-06: qty 1

## 2021-02-06 SURGICAL SUPPLY — 37 items
BAG DRAIN URO-CYSTO SKYTR STRL (DRAIN) ×2 IMPLANT
BAG DRN RND TRDRP ANRFLXCHMBR (UROLOGICAL SUPPLIES) ×1
BAG DRN UROCATH (DRAIN) ×1
BAG URINE DRAIN 2000ML AR STRL (UROLOGICAL SUPPLIES) ×2 IMPLANT
BAG URINE LEG 500ML (DRAIN) IMPLANT
BULB IRRIG PATHFIND (MISCELLANEOUS) ×2 IMPLANT
CATH FOLEY 3WAY 30CC 22FR (CATHETERS) ×2 IMPLANT
CATH HEMA 3WAY 30CC 22FR COUDE (CATHETERS) ×2 IMPLANT
CATH HEMA 3WAY 30CC 24FR COUDE (CATHETERS) IMPLANT
CATH HEMA 3WAY 30CC 24FR RND (CATHETERS) IMPLANT
CATH URET 5FR 28IN CONE TIP (BALLOONS)
CATH URET 5FR 28IN OPEN ENDED (CATHETERS) IMPLANT
CATH URET 5FR 70CM CONE TIP (BALLOONS) IMPLANT
CLOTH BEACON ORANGE TIMEOUT ST (SAFETY) ×2 IMPLANT
ELECT REM PT RETURN 9FT ADLT (ELECTROSURGICAL)
ELECTRODE REM PT RTRN 9FT ADLT (ELECTROSURGICAL) IMPLANT
EVACUATOR MICROVAS BLADDER (UROLOGICAL SUPPLIES) ×2 IMPLANT
FIBER LASER FLEXIVA 365 (UROLOGICAL SUPPLIES) IMPLANT
GLOVE SURG ENC MOIS LTX SZ7.5 (GLOVE) ×2 IMPLANT
GOWN STRL REUS W/TWL LRG LVL3 (GOWN DISPOSABLE) ×2 IMPLANT
GUIDEWIRE ANG ZIPWIRE 038X150 (WIRE) IMPLANT
GUIDEWIRE STR DUAL SENSOR (WIRE) IMPLANT
HOLDER FOLEY CATH W/STRAP (MISCELLANEOUS) ×2 IMPLANT
IV NS IRRIG 3000ML ARTHROMATIC (IV SOLUTION) ×14 IMPLANT
KIT TURNOVER CYSTO (KITS) ×2 IMPLANT
LOOP CUT BIPOLAR 24F LRG (ELECTROSURGICAL) ×2 IMPLANT
MANIFOLD NEPTUNE II (INSTRUMENTS) ×2 IMPLANT
PACK CYSTO (CUSTOM PROCEDURE TRAY) ×2 IMPLANT
PLUG CATH AND CAP STER (CATHETERS) IMPLANT
SYR 20ML LL LF (SYRINGE) ×2 IMPLANT
SYR 30ML LL (SYRINGE) ×2 IMPLANT
SYR TOOMEY IRRIG 70ML (MISCELLANEOUS) ×2
SYRINGE TOOMEY IRRIG 70ML (MISCELLANEOUS) ×1 IMPLANT
TRACTIP FLEXIVA PULS ID 200XHI (Laser) IMPLANT
TRACTIP FLEXIVA PULSE ID 200 (Laser)
TUBE CONNECTING 12X1/4 (SUCTIONS) ×4 IMPLANT
TUBING UROLOGY SET (TUBING) ×2 IMPLANT

## 2021-02-06 NOTE — Consult Note (Signed)
Referring Physician: Harold Barban, MD  Richard King is an 80 y.o. male.                       Chief Complaint: 3rd degree heart block, transient  HPI: 80 years old white male with PMH of HTN, benign positional vertigo, BPH, s/p TURP today had transient 3rd degree AV block with hypotension. Patient denies h/o chest pain or shortness of breath or palpitations. He has h/o dizziness from BPPV. He is on Verapamil. His monitor is stable sinus rhythm now with normal VS. His BMET is normal.  Past Medical History:  Diagnosis Date   B12 deficiency    Benign localized prostatic hyperplasia with lower urinary tract symptoms (LUTS)    urologist--- dr Milford Cage   BPPV (benign paroxysmal positional vertigo)    Complication of anesthesia    post op urinary retention 07/ 29/ 2021 surgery   ED (erectile dysfunction)    History of 2019 novel coronavirus disease (COVID-19) 05/22/2020   positive results in epic;   per pt mild symptoms that resolved   History of bladder cancer    History of kidney stones    Hx of adenomatous colonic polyps    Hypertension    OA (osteoarthritis)       Past Surgical History:  Procedure Laterality Date   CATARACT EXTRACTION W/ INTRAOCULAR LENS IMPLANT Bilateral 2010   COLONOSCOPY     CYSTOSCOPY/URETEROSCOPY/HOLMIUM LASER/STENT PLACEMENT Right 12/15/2009   '@[facility]    CYSTOSTOMY W/ BLADDER BIOPSY  10/01/2010   '@[facility]    EXTRACORPOREAL SHOCK WAVE LITHOTRIPSY     2007;  07/ 2011   REVERSE SHOULDER ARTHROPLASTY Left 12/30/2019   Procedure: LEFT REVERSE SHOULDER ARTHROPLASTY;  Surgeon: Meredith Pel, MD;  Location: [facility];  Service: Orthopedics;  Laterality: Left;   TONGUE SURGERY     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 His smoking use included cigarettes. He has never used smokeless tobacco. He reports that he does not drink alcohol and does not use drugs.  Allergies:  Allergies  Allergen Reactions   Ivp Dye [Iodinated Diagnostic Agents] Swelling   Codeine     insomnia    Medications Prior to Admission  Medication Sig Dispense Refill   cyanocobalamin (,VITAMIN B-12,) 1000 MCG/ML injection 1053mg IM every 14 days (Patient taking differently: Inject 1,000 mcg into the muscle every 14 (fourteen) days. 10032m IM every 14 days) 1 mL    dutasteride (AVODART) 0.5 MG capsule Take 0.5 mg by mouth at bedtime.     sertraline (ZOLOFT) 50 MG tablet Take 1 tablet (50 mg total) by mouth daily. (Patient taking differently: Take 50 mg by mouth at bedtime.) 90 tablet 3   silodosin (RAPAFLO) 8 MG CAPS capsule Take 8 mg by mouth at bedtime.     verapamil (CALAN-SR) 120 MG CR tablet Take 1 tablet (120 mg total) by mouth at bedtime. 90 tablet 3    Results for orders placed or performed during the hospital  encounter of 02/06/21 (from the past 48 hour(s))  I-STAT, chem 8     Status: None   Collection Time: 02/06/21  6:27 AM  Result Value Ref Range   Sodium 143 135 - 145 mmol/L   Potassium 3.7 3.5 - 5.1 mmol/L   Chloride 106 98 - 111 mmol/L   BUN 15 8 - 23 mg/dL   Creatinine, Ser 0.80 0.61 - 1.24 mg/dL   Glucose, Bld 98 70 - 99 mg/dL    Comment: Glucose reference range applies only to samples taken after fasting for at least 8 hours.   Calcium, Ion 1.34 1.15 - 1.40 mmol/L   TCO2 24 22 - 32 mmol/L   Hemoglobin 16.0 13.0 - 17.0 g/dL   HCT 47.0 39.0 - 52.0 %   No results found.  Review Of Systems Constitutional: No fever, chills, weight loss or gain. Eyes: No vision change, wears glasses. No discharge or pain. Ears: No hearing loss, No tinnitus. Positional  vertigo, paroxysmal and sleeping on sides. Respiratory: No asthma, COPD, pneumonias. No shortness of breath. No hemoptysis. Cardiovascular: No chest pain, palpitation, leg edema. Gastrointestinal: No nausea, vomiting, diarrhea, constipation. No GI bleed. No hepatitis. Genitourinary: No dysuria, hematuria, h/o kidney stone. No incontinance. H/O BPH. Neurological: No headache, stroke, seizures.  Psychiatry: No psych facility admission for anxiety, depression, suicide. No detox. Skin: No rash. Musculoskeletal: Positive joint pain, fibromyalgia. No neck pain, back pain. Lymphadenopathy: No lymphadenopathy. Hematology: No anemia or easy bruising.   Blood pressure (!) 141/82, pulse 85, temperature (!) 97.5 F (36.4 C), resp. rate 13, height '5\' 11"'$  (1.803 m), weight 104.1 kg, SpO2 96 %. Body mass index is 31.99 kg/m. General appearance: alert, cooperative, appears stated age and no distress Head: Normocephalic, atraumatic. Eyes: Blue eyes, pink conjunctiva, corneas clear. PERRL, EOM's intact. Neck: No adenopathy, no carotid bruit, no JVD, supple, symmetrical, trachea midline and thyroid not enlarged. Resp: Clear to auscultation bilaterally. Cardio: Regular rate and rhythm, S1, S2 normal, II/VI systolic murmur, no click, rub or gallop GI: Soft, non-tender; bowel sounds normal; no organomegaly. Extremities: No edema, cyanosis or clubbing. Skin: Warm and dry.  Neurologic: Alert and oriented X 3, normal strength.   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 Echocardiogram for LV function Change Verapamil to Hydralazine, small dose, for reflex tachycardia and BP. Add small dose amlodipine or ACE inhibitor if needed for BP control.  Time spent: Review of old records, Lab, x-rays, EKG, other cardiac tests, examination, discussion with patient/Nurse/Physician over 70  minutes.  Birdie Riddle, MD  02/06/2021, 10:16 AM

## 2021-02-06 NOTE — Interval H&P Note (Signed)
History and Physical Interval Note:  02/06/2021 7:20 AM  Richard King  has presented today for surgery, with the diagnosis of BENIGN PROSTATIC HYPERPLASIA.  The various methods of treatment have been discussed with the patient and family. After consideration of risks, benefits and other options for treatment, the patient has consented to  Procedure(s) with comments: [place] (TURP) (N/A) - 1 HR CYSTOSCOPY (N/A) as a surgical intervention.  The patient's history has been reviewed, patient examined, no change in status, stable for surgery.  I have reviewed the patient's chart and labs.  Questions were answered to the patient's satisfaction.     Remi Haggard

## 2021-02-06 NOTE — Transfer of Care (Signed)
Immediate Anesthesia Transfer of Care Note  Patient: Richard King  Procedure(s) Performed: TRANSURETHRAL RESECTION OF THE PROSTATE (TURP), BLADDER LESION BIOPSY (Bladder) CYSTOSCOPY (Bladder)  Patient Location: PACU  Anesthesia Type:General  Level of Consciousness: awake, alert  and oriented  Airway & Oxygen Therapy: Patient Spontanous Breathing and Patient connected to nasal cannula oxygen  Post-op Assessment: Report given to RN  Post vital signs: Reviewed and stable  Last Vitals:  Vitals Value Taken Time  BP 138/85 02/06/21 0848  Temp    Pulse 88 02/06/21 0851  Resp 16 02/06/21 0851  SpO2 98 % 02/06/21 0851  Vitals shown include unvalidated device data.  Last Pain:  Vitals:   02/06/21 0608  TempSrc: Oral  PainSc: 0-No pain         Complications: No notable events documented.

## 2021-02-06 NOTE — Progress Notes (Signed)
Tc to bed placement, Joycelyn Schmid states no current telemetry bed available, waiting for inpatient discharges. Will notify PACU staff when tele bed available for patient. Pt also informed of update, verbalized understanding. Pt denies pain, SOB; VSS, currently SR on continuous monitoring.

## 2021-02-06 NOTE — Plan of Care (Signed)
  Problem: Urinary Elimination: Goal: Ability to avoid or minimize complications of infection will improve Outcome: Progressing   Problem: Education: Goal: Knowledge of General Education information will improve Description: Including pain rating scale, medication(s)/side effects and non-pharmacologic comfort measures Outcome: Completed/Met

## 2021-02-06 NOTE — Anesthesia Postprocedure Evaluation (Signed)
Anesthesia Post Note  Patient: Richard King  Procedure(s) Performed: TRANSURETHRAL RESECTION OF THE PROSTATE (TURP), BLADDER LESION BIOPSY (Bladder) CYSTOSCOPY (Bladder)     Patient location during evaluation: PACU Anesthesia Type: General Level of consciousness: awake and alert Pain management: pain level controlled Vital Signs Assessment: post-procedure vital signs reviewed and stable Respiratory status: spontaneous breathing, nonlabored ventilation and respiratory function stable Cardiovascular status: stable and blood pressure returned to baseline Anesthetic complications: no   No notable events documented.  Last Vitals:  Vitals:   02/06/21 0614 02/06/21 0848  BP: (!) 170/90 138/85  Pulse:  92  Resp:  18  Temp:  36.5 C  SpO2:  96%    Last Pain:  Vitals:   02/06/21 0915  TempSrc:   PainSc: 0-No pain                 Audry Pili

## 2021-02-06 NOTE — Progress Notes (Signed)
Assumed care of patient for lunch relief.

## 2021-02-06 NOTE — Plan of Care (Signed)
  Problem: Education: Goal: Knowledge of the prescribed therapeutic regimen will improve Outcome: Progressing   Problem: Bowel/Gastric: Goal: Gastrointestinal status for postoperative course will improve Outcome: Progressing   Problem: Health Behavior/Discharge Planning: Goal: Identification of resources available to assist in meeting health care needs will improve Outcome: Progressing   Problem: Skin Integrity: Goal: Demonstration of wound healing without infection will improve Outcome: Progressing   Problem: Urinary Elimination: Goal: Ability to avoid or minimize complications of infection will improve Outcome: Progressing   Problem: Clinical Measurements: Goal: Ability to maintain clinical measurements within normal limits will improve Outcome: Progressing Goal: Will remain free from infection Outcome: Progressing Goal: Diagnostic test results will improve Outcome: Progressing Goal: Respiratory complications will improve Outcome: Progressing Goal: Cardiovascular complication will be avoided Outcome: Progressing   Problem: Activity: Goal: Risk for activity intolerance will decrease Outcome: Progressing   Problem: Pain Managment: Goal: General experience of comfort will improve Outcome: Progressing   Problem: Elimination: Goal: Will not experience complications related to bowel motility Outcome: Progressing Goal: Will not experience complications related to urinary retention Outcome: Progressing

## 2021-02-06 NOTE — Op Note (Signed)
Preoperative diagnosis:  1.  Benign prostatic hypertrophy 2.  History of bladder cancer  Postoperative diagnosis: 1.  Benign prostatic hypertrophy 2.  Possible recurrent bladder cancer  Procedure(s): 1.  Cystoscopy, bladder biopsy with fulguration, transurethral resection of the prostate  Surgeon: Dr. Harold Barban  Anesthesia: General  Complications: None  EBL: Minimal  Specimens: Bladder lesion Prostate chips  Disposition of specimens: To pathology  Intraoperative findings: Patient had velvety lesion in the posterior aspect of the bladder during about 1 cm in size.  Biopsy taken and fulgurated.  Another small erythematous area was fulgurated in the left floor of the bladder.  Patient had bilobar prostatic hypertrophy, TURP performed without difficulty.  Indication: Patient is a 80 year old white male history of BPH and bladder outlet obstructive symptoms which have been somewhat responsive to medication however he is intolerant of 5 alpha reductase inhibitor.  Desires definitive management for his BPH and is here now for TURP.  Patient also history of bladder cancer has a small erythematous area in the posterior aspect of the bladder may need biopsy.  Description of procedure:  After obtaining informed consent for the patient is taken the major cystoscopy suite placed under general anesthesia.  Placed in the dorsolithotomy position genitalia prepped and draped in usual sterile fashion.  Proper pause and timeout performed.  55 Fransico was advanced in the bladder without difficulty.  Patient was noted to have mainly bilobar prostatic hypertrophy with 2-1/2 cm prostatic urethral length.  Bladder was inspected and with 30 and 70 degree lenses.  There was an erythematous velvety appearing area approximate 1 cm in size on the posterior aspect of the bladder.  There was an additional erythematous area in the left lateral floor the bladder superior to the left ureteral orifice.  Utilizing  rigid biopsy forceps biopsy was taken from the posterior bladder lesion and sent for pathologic evaluation.  The saline resectoscope was then placed utilizing the direct visualized obturator without difficulty.  Utilizing the resectoscope loop the previous biopsy site was cauterized and good hemostasis noted.  The other erythematous area was also cauterized so there were no remaining lesions noted.  Attention was then directed towards TURP.  Anterior commissure was released.  The lateral lobes were then resected from the bladder neck to the level just proximal to the verumontanum from the 7:00 to 11 o'clock position.  In a similar manner resection was carried out from the 1:00 to 5 o'clock position resecting nice open channel through the prostate taking care to spare the area of the external striated sphincter.  Prostatic chips were subsequently irrigated from the bladder and second look in the bladder filled no remaining chips.  The previous biopsy sites were also noted to have good hemostasis.  Cautery loop was utilized to achieve hemostasis within the prostate and nose bleeding was noted.  The resectoscope was removed and a 50 [place] three-way Foley catheter was placed to light CBI irrigation.  The irrigant was clear at termination of the case.  It should be noted that during the case the patient did have experience what appeared to be second-degree heart block with subsequent converted back to regular rhythm.  He was hemodynamically stable.  Subsequently awake from anesthesia and taken back to the recovery room in stable condition.  No immediate complication from the procedure.

## 2021-02-06 NOTE — Anesthesia Procedure Notes (Signed)
Procedure Name: LMA Insertion Date/Time: 02/06/2021 7:35 AM Performed by: Bonney Aid, CRNA Pre-anesthesia Checklist: Patient identified, Emergency Drugs available, Suction available and Patient being monitored Patient Re-evaluated:Patient Re-evaluated prior to induction Oxygen Delivery Method: Circle system utilized Preoxygenation: Pre-oxygenation with 100% oxygen Induction Type: IV induction Ventilation: Mask ventilation without difficulty LMA: LMA inserted LMA Size: 5.0 Number of attempts: 1 Airway Equipment and Method: Bite block Placement Confirmation: positive ETCO2 Tube secured with: Tape Dental Injury: Teeth and Oropharynx as per pre-operative assessment

## 2021-02-06 NOTE — Progress Notes (Signed)
Tc to patient's daughter [name], informed daughter patient is stable and awaiting inpatient bed. Tammy stated Dr. Milford Cage had already updated her about patient's plan of care. Will update daughter when room is available. Pt is aware.

## 2021-02-07 ENCOUNTER — Encounter (HOSPITAL_BASED_OUTPATIENT_CLINIC_OR_DEPARTMENT_OTHER): Payer: Self-pay | Admitting: Urology

## 2021-02-07 ENCOUNTER — Ambulatory Visit (HOSPITAL_COMMUNITY): Payer: Medicare PPO

## 2021-02-07 DIAGNOSIS — R338 Other retention of urine: Secondary | ICD-10-CM | POA: Diagnosis not present

## 2021-02-07 DIAGNOSIS — I442 Atrioventricular block, complete: Secondary | ICD-10-CM | POA: Diagnosis not present

## 2021-02-07 DIAGNOSIS — R3914 Feeling of incomplete bladder emptying: Secondary | ICD-10-CM | POA: Diagnosis not present

## 2021-02-07 DIAGNOSIS — Z8616 Personal history of COVID-19: Secondary | ICD-10-CM | POA: Diagnosis not present

## 2021-02-07 DIAGNOSIS — I97191 Other postprocedural cardiac functional disturbances following other surgery: Secondary | ICD-10-CM | POA: Diagnosis not present

## 2021-02-07 DIAGNOSIS — N138 Other obstructive and reflux uropathy: Secondary | ICD-10-CM | POA: Diagnosis not present

## 2021-02-07 DIAGNOSIS — N401 Enlarged prostate with lower urinary tract symptoms: Secondary | ICD-10-CM | POA: Diagnosis not present

## 2021-02-07 DIAGNOSIS — R3912 Poor urinary stream: Secondary | ICD-10-CM | POA: Diagnosis not present

## 2021-02-07 DIAGNOSIS — N3030 Trigonitis without hematuria: Secondary | ICD-10-CM | POA: Diagnosis not present

## 2021-02-07 LAB — ECHOCARDIOGRAM COMPLETE
AR max vel: 6.86 cm2
AV Area VTI: 6.92 cm2
AV Area mean vel: 6.58 cm2
AV Mean grad: 7.5 mmHg
AV Peak grad: 15.2 mmHg
Ao pk vel: 1.95 m/s
Area-P 1/2: 2.52 cm2
Calc EF: 58.7 %
Height: 71 in
P 1/2 time: 616 msec
S' Lateral: 4.1 cm
Single Plane A2C EF: 64.6 %
Single Plane A4C EF: 48.2 %
Weight: 3670.4 oz

## 2021-02-07 LAB — BASIC METABOLIC PANEL
Anion gap: 5 (ref 5–15)
BUN: 18 mg/dL (ref 8–23)
CO2: 27 mmol/L (ref 22–32)
Calcium: 9.3 mg/dL (ref 8.9–10.3)
Chloride: 109 mmol/L (ref 98–111)
Creatinine, Ser: 0.79 mg/dL (ref 0.61–1.24)
GFR, Estimated: >60 mL/min (ref >60)
Glucose, Bld: 102 mg/dL — ABNORMAL HIGH (ref 70–99)
Potassium: 3.9 mmol/L (ref 3.5–5.1)
Sodium: 141 mmol/L (ref 135–145)

## 2021-02-07 LAB — CBC
HCT: 37 % — ABNORMAL LOW (ref 39.0–52.0)
Hemoglobin: 12.5 g/dL — ABNORMAL LOW (ref 13.0–17.0)
MCH: 32.3 pg (ref 26.0–34.0)
MCHC: 33.8 g/dL (ref 30.0–36.0)
MCV: 95.6 fL (ref 80.0–100.0)
Platelets: 210 10*3/uL (ref 150–400)
RBC: 3.87 MIL/uL — ABNORMAL LOW (ref 4.22–5.81)
RDW: 13.2 % (ref 11.5–15.5)
WBC: 10.7 10*3/uL — ABNORMAL HIGH (ref 4.0–10.5)
nRBC: 0 % (ref 0.0–0.2)

## 2021-02-07 LAB — LIPID PANEL
Cholesterol: 164 mg/dL (ref 0–200)
HDL: 49 mg/dL (ref >40)
LDL Cholesterol: 90 mg/dL (ref 0–99)
Total CHOL/HDL Ratio: 3.3 RATIO
Triglycerides: 126 mg/dL (ref <150)
VLDL: 25 mg/dL (ref 0–40)

## 2021-02-07 LAB — TSH: TSH: 0.762 u[IU]/mL (ref 0.350–4.500)

## 2021-02-07 LAB — SURGICAL PATHOLOGY

## 2021-02-07 MED ORDER — DUTASTERIDE 0.5 MG PO CAPS: 0.5000 mg | ORAL_CAPSULE | Freq: Every day | ORAL | 11 refills | Status: DC

## 2021-02-07 MED ORDER — HYDRALAZINE HCL 10 MG PO TABS: 10.0000 mg | ORAL_TABLET | Freq: Two times a day (BID) | ORAL | 11 refills | Status: DC

## 2021-02-07 NOTE — Progress Notes (Signed)
1 Day Post-Op Subjective: Patient feeling well, no events over the night.  No issues regarding heart rhythm.  In sinus rhythm this morning.  Patient had echocardiogram showing fairly normal left ventricular function according to cardiology.  Okay for discharge home with plan for follow-up next week with EKG.  Urine remains clear off CBI  Objective: Vital signs in last 24 hours: Temp:  [97.2 F (36.2 C)-98 F (36.7 C)] 97.4 F (36.3 C) (09/07 0606) Pulse Rate:  [59-94] 59 (09/07 0606) Resp:  [11-26] 18 (09/07 0606) BP: (98-137)/(66-101) 119/67 (09/07 0606) SpO2:  [92 %-99 %] 94 % (09/07 0606)  Intake/Output from previous day: 09/06 0701 - 09/07 0700 In: 4324.2 [P.O.:720; I.V.:304.2; IV Piggyback:100] Out: 5225 [Urine:5125; Blood:100] Intake/Output this shift: Total I/O In: 240 [P.O.:240] Out: -   Physical Exam:  General: Alert and oriented   Lab Results: Recent Labs    02/06/21 0627  HGB 16.0  HCT 47.0   BMET Recent Labs    02/06/21 0627  NA 143  K 3.7  CL 106  GLUCOSE 98  BUN 15  CREATININE 0.80     Studies/Results: No results found.  Assessment/Plan: Status post TURP doing well Plan/recommendation: Plan for discontinuation of CBI with catheter plug in the irrigation port discharge home with Foley catheter with plan to return 02/13/2021 for Foley removal and voiding trial. Cardiology has changed verapamil to hydralazine 10 mg twice daily.    LOS: 0 days   Remi Haggard 02/07/2021, 10:17 AM

## 2021-02-07 NOTE — Progress Notes (Signed)
*  PRELIMINARY RESULTS* Echocardiogram 2D Echocardiogram has been performed.  Luisa Hart RDCS 02/07/2021, 10:26 AM

## 2021-02-07 NOTE — Consult Note (Signed)
Ref: Tonia Ghent, MD   Subjective:  Awake. No chest pain. No palpitation. Monitor sinus rhythm with 1st degree AV block. Good urine output without hematuria. Quick look echocardiogram showed preserved LV systolic function with EF 50-55 %.  Objective:  Vital Signs in the last 24 hours: Temp:  [97.2 F (36.2 C)-98 F (36.7 C)] 97.4 F (36.3 C) (09/07 0606) Pulse Rate:  [59-94] 59 (09/07 0606) Cardiac Rhythm: Heart block (09/07 0701) Resp:  [11-26] 18 (09/07 0606) BP: (98-141)/(66-101) 119/67 (09/07 0606) SpO2:  [92 %-99 %] 94 % (09/07 0606)  Physical Exam: BP Readings from Last 1 Encounters:  02/07/21 119/67     Wt Readings from Last 1 Encounters:  02/06/21 104.1 kg    Weight change:  Body mass index is 31.99 kg/m. HEENT: [place]/AT, Eyes-Blue, Conjunctiva-Pink, Sclera-Non-icteric Neck: No JVD, No bruit, Trachea midline. Lungs:  Clear, Bilateral. Cardiac:  Regular rhythm, normal S1 and S2, no S3. II/VI systolic murmur. Abdomen:  Soft, distended, non-tender. BS present. Extremities:  No edema present. No cyanosis. No clubbing. CNS: AxOx3, Cranial nerves grossly intact, moves all 4 extremities.  Skin: Warm and dry.   Intake/Output from previous day: 09/06 0701 - 09/07 0700 In: 4324.2 [P.O.:720; I.V.:304.2; IV Piggyback:100] Out: 5225 [Urine:5125; Blood:100]    Lab Results: BMET    Component Value Date/Time   NA 143 02/06/2021 0627   NA 138 05/31/2020 1530   NA 143 01/06/2020 0315   K 3.7 02/06/2021 0627   K 4.2 05/31/2020 1530   K 4.2 01/06/2020 0315   CL 106 02/06/2021 0627   CL 104 05/31/2020 1530   CL 105 01/06/2020 0315   CO2 22 05/31/2020 1530   CO2 29 01/06/2020 0315   CO2 30 01/05/2020 1243   GLUCOSE 98 02/06/2021 0627   GLUCOSE 87 05/31/2020 1530   GLUCOSE 111 (H) 01/06/2020 0315   BUN 15 02/06/2021 0627   BUN 14 05/31/2020 1530   BUN 18 01/06/2020 0315   CREATININE 0.80 02/06/2021 0627   CREATININE 0.91 05/31/2020 1530   CREATININE 0.96  01/06/2020 0315   CALCIUM 9.1 05/31/2020 1530   CALCIUM 9.3 01/06/2020 0315   CALCIUM 10.3 01/05/2020 1243   GFRNONAA >60 05/31/2020 1530   GFRNONAA >60 01/06/2020 0315   GFRNONAA >60 01/05/2020 1243   GFRNONAA >60 12/27/2019 1440   GFRAA >60 01/06/2020 0315   GFRAA >60 01/05/2020 1243   GFRAA >60 12/27/2019 1440   CBC    Component Value Date/Time   WBC 5.1 05/31/2020 1530   RBC 4.13 (L) 05/31/2020 1530   HGB 16.0 02/06/2021 0627   HCT 47.0 02/06/2021 0627   PLT 210 05/31/2020 1530   MCV 95.9 05/31/2020 1530   MCH 31.5 05/31/2020 1530   MCHC 32.8 05/31/2020 1530   RDW 14.2 05/31/2020 1530   LYMPHSABS 0.6 (L) 01/05/2020 1243   MONOABS 1.0 01/05/2020 1243   EOSABS 0.2 01/05/2020 1243   BASOSABS 0.0 01/05/2020 1243   HEPATIC Function Panel No results for input(s): PROT in the last 8760 hours.  Invalid input(s):  ALBUMIN,  AST,  ALT,  ALKPHOS,  BILIDIR,  IBILI HEMOGLOBIN A1C No components found for: HGA1C,  MPG CARDIAC ENZYMES No results found for: CKTOTAL, CKMB, CKMBINDEX, TROPONINI BNP No results for input(s): PROBNP in the last 8760 hours. TSH No results for input(s): TSH in the last 8760 hours. CHOLESTEROL No results for input(s): CHOL in the last 8760 hours.  Scheduled Meds:  Chlorhexidine Gluconate Cloth  6 each Topical Daily  dutasteride  0.5 mg Oral QHS   hydrALAZINE  10 mg Oral BID   melatonin  3 mg Oral QHS   sertraline  50 mg Oral QHS   Continuous Infusions:  sodium chloride 50 mL/hr at 02/06/21 1522   sodium chloride irrigation     PRN Meds:.acetaminophen, menthol-cetylpyridinium, ondansetron, oxybutynin, phenol  Assessment/Plan: Sinus rhythm HTN BPH with TURP yesterday BPPV  Plan: May discharge home with small dose hydralazine 10 mg. Bid in place of Verapamil. Check BMET, TSH, Lipid. CBC today. F/U in 1 week. Discussed care with Dr. Devoria Glassing.   LOS: 0 days   Time spent including chart review, lab review, examination, discussion with  patient/Tech/Physician : 30 min   Dixie Dials  MD  02/07/2021, 9:55 AM

## 2021-02-07 NOTE — Discharge Summary (Signed)
Date of admission: 02/06/2021  Date of discharge: 02/07/2021  Admission diagnosis: BPH  Discharge diagnosis: BPH  Secondary diagnoses: Heart block  History and Physical: For full details, please see admission history and physical. Briefly, Richard King is a 80 y.o. year old patient with history of BPH with progressive bladder outlet obstruction.  He has been intolerant of BPH meds and presents at this time undergo cystoscopy and TURP for details of the history and physical please see admission history and physical in the chart.Marland Kitchen   Hospital Course: Patient was admitted on 02/06/2021 after undergoing cystoscopy and TURP.  For details procedure please see the type operative note.  Patient is procedure was complicated by brief spell of third degree heart block with hypotension.  He subsequently converted back to normal sinus rhythm and was stable post procedurally.  He was admitted to the telemetry unit cardiac consultation was obtained.  Echocardiogram was performed showed essentially normal left ventricular function some mild aortic calcification but patient had no further heart block or rhythm abnormality first postoperative night.  Patient urine clear off of CBI it was felt ready for discharge home on 02/07/2021.  Patient will be discharged home on his routine preoperative medications with the following changes.  He is no longer again to be taking verapamil but was changed to hydralazine 10 mg twice daily based on cardiac recommendations.  He is scheduled to follow-up with cardiology in 1 week for EKG in their office.  He is scheduled to follow-up on 02/13/2021 for Foley removal and voiding trial.  He knows our telephone number and knows to call should problems arise relative to his management in the interim.  Laboratory values:  Recent Labs    02/06/21 0627  HGB 16.0  HCT 47.0   Recent Labs    02/06/21 0627  CREATININE 0.80    Disposition: Home  Discharge instruction: The patient was  instructed to be ambulatory but told to refrain from heavy lifting, strenuous activity, or driving.   Discharge medications:  Allergies as of 02/07/2021       Reactions   Ivp Dye [iodinated Diagnostic Agents] Swelling   Codeine    insomnia        Medication List     STOP taking these medications    silodosin 8 MG Caps capsule Commonly known as: RAPAFLO   verapamil 120 MG CR tablet Commonly known as: CALAN-SR       TAKE these medications    cyanocobalamin 1000 MCG/ML injection Commonly known as: (VITAMIN B-12) 1029mg IM every 14 days What changed:  how much to take how to take this when to take this   dutasteride 0.5 MG capsule Commonly known as: AVODART Take 0.5 mg by mouth at bedtime. What changed: Another medication with the same name was added. Make sure you understand how and when to take each.   dutasteride 0.5 MG capsule Commonly known as: AVODART Take 1 capsule (0.5 mg total) by mouth at bedtime. What changed: You were already taking a medication with the same name, and this prescription was added. Make sure you understand how and when to take each.   hydrALAZINE 10 MG tablet Commonly known as: APRESOLINE Take 1 tablet (10 mg total) by mouth 2 (two) times daily.   sertraline 50 MG tablet Commonly known as: ZOLOFT Take 1 tablet (50 mg total) by mouth daily. What changed: when to take this        Followup:   Follow-up Information  Dixie Dials, MD. Schedule an appointment as soon as possible for a visit in 1 week(s).   Specialty: Cardiology Contact information: [address]  [place] [address] [phone]         [facility] Follow up on 02/13/2021.   Why: As scheduled for voiding trial Contact information: [place] [phone]

## 2021-02-12 ENCOUNTER — Telehealth: Payer: Self-pay | Admitting: Family Medicine

## 2021-02-12 DIAGNOSIS — R3914 Feeling of incomplete bladder emptying: Secondary | ICD-10-CM | POA: Diagnosis not present

## 2021-02-12 MED ORDER — HYDRALAZINE HCL 10 MG PO TABS: 10.0000 mg | ORAL_TABLET | Freq: Three times a day (TID) | ORAL | Status: DC

## 2021-02-12 NOTE — Telephone Encounter (Signed)
Patient called in stated he need's a call back regarding his BP medication [phone]

## 2021-02-12 NOTE — Telephone Encounter (Signed)
Spoke to patient by telephone and was advised that he had surgery last week. Patient stated that he had problems with his blood pressure. Patient stated that Dr. Doylene Canard stopped his verapamil 120 mg daily and started  him on Hydralazine 10 mg two times a day. Patient stated that he is concerned about his blood pressure today. Patient wants to know if Dr. Damita Dunnings wants to change his medication back to what he was on. Patient was given ER precautions and he verbalized understanding.

## 2021-02-12 NOTE — Telephone Encounter (Signed)
I would inc hydralazine to '10mg'$  3 times a day.  Update [place] as needed.  If CP or persistent HA then to ER.  Please have him call about f/u with cardiology.  I wouldn't restart verapamil.  Thanks.

## 2021-02-12 NOTE — Telephone Encounter (Signed)
Patient call in with BP medication concern . BP was 170/107 patient was transfer to Triage nurse

## 2021-02-12 NOTE — Telephone Encounter (Signed)
Patient notified as instructed by telephone and verbalized understanding. Patient stated that he has an appointment scheduled 02/14/21 with cardiology.

## 2021-02-12 NOTE — Telephone Encounter (Signed)
PLEASE NOTE: All timestamps contained within this report are represented as [place] Standard Time. CONFIDENTIALTY NOTICE: This fax transmission is intended only for the addressee. It contains information that is legally privileged, confidential or otherwise protected from use or disclosure. If you are not the intended recipient, you are strictly prohibited from reviewing, disclosing, copying using or disseminating any of this information or taking any action in reliance on or regarding this information. If you have received this fax in error, please notify us immediately by telephone so that we can arrange for its return to [place]. Phone: [phone], Toll-Free: [phone], Fax: [phone] Page: 1 of 2 Call Id: [id number] [facility] - Client TELEPHONE ADVICE RECORD [facility] Patient Name: Richard King Gender: Male DOB: 07/19/1940 Age: 80 Y 49 M Return Phone Number: [phone] (Primary) Address: City/ State/ Zip: [place] [address] Client [facility] - Client Client Site [place] Physician Renford Dills - MD Contact Type Call Who Is Calling Patient / Member / Family / Caregiver Call Type Triage / Clinical Relationship To Patient Self Return Phone Number [phone] (Primary) Chief Complaint Blood Pressure High Reason for Call Symptomatic / Request for [place] states has concern regarding blood pressure. Caller states cardiologist changed his medication when he was in the hospital last week. Caller went to doctor today 9/12 and they changed his blood pressure medication. Last blood pressure was 170/107. Translation No Nurse Assessment Nurse: Lucky Cowboy, RN, Levada Dy Date/Time (Eastern Time): 02/12/2021 3:01:03 PM Confirm and document reason for call. If symptomatic, describe symptoms. ---Caller stated that he was on Verapamil 120 mg and the cardiologist changed it  to Hydralazine BID last week when he was in the hospital. His BP 170/107 at the urologist office this morning. He wants to know why he was changed. He has a headache currently. Does the patient have any new or worsening symptoms? ---Yes Will a triage be completed? ---Yes Related visit to physician within the last 2 weeks? ---Yes Does the PT have any chronic conditions? (i.e. diabetes, asthma, this includes High risk factors for pregnancy, etc.) ---Yes List chronic conditions. ---HTN, prostate issue Is this a behavioral health or substance abuse call? ---No Guidelines Guideline Title Affirmed Question Affirmed Notes Nurse Date/Time (Eastern Time) Headache [1] New headache AND [2] age > 80 Dew, RN, Levada Dy 02/12/2021 3:04:32 PM Disp. Time Eilene Ghazi Time) Disposition Final User PLEASE NOTE: All timestamps contained within this report are represented as [place] Standard Time. CONFIDENTIALTY NOTICE: This fax transmission is intended only for the addressee. It contains information that is legally privileged, confidential or otherwise protected from use or disclosure. If you are not the intended recipient, you are strictly prohibited from reviewing, disclosing, copying using or disseminating any of this information or taking any action in reliance on or regarding this information. If you have received this fax in error, please notify us immediately by telephone so that we can arrange for its return to [place]. Phone: [phone], Toll-Free: [phone], Fax: [phone] Page: 2 of 2 Call Id: [id number] 02/12/2021 3:10:06 PM See PCP within 24 Hours Yes Dew, RN, Marin Shutter Disagree/Comply Comply Caller Understands Yes PreDisposition Home Care Care Advice Given Per Guideline SEE PCP WITHIN 24 HOURS: * IF OFFICE WILL BE OPEN: You need to be examined within the next 24 hours. Call your doctor (or NP/PA) when the office opens and make an appointment. CALL BACK IF: * Blurred vision or double vision  occurs * Numbness or weakness of the face, arm or leg * Difficulty with speaking * You become worse CARE ADVICE given per Headache (Adult) guideline. Referrals REFERRED TO PCP OFFICE

## 2021-02-13 ENCOUNTER — Other Ambulatory Visit: Payer: Medicare PPO

## 2021-02-13 ENCOUNTER — Ambulatory Visit: Payer: Medicare PPO

## 2021-02-14 ENCOUNTER — Ambulatory Visit (INDEPENDENT_AMBULATORY_CARE_PROVIDER_SITE_OTHER): Payer: Medicare PPO

## 2021-02-14 ENCOUNTER — Other Ambulatory Visit: Payer: Self-pay

## 2021-02-14 ENCOUNTER — Other Ambulatory Visit: Payer: Medicare PPO

## 2021-02-14 ENCOUNTER — Other Ambulatory Visit (INDEPENDENT_AMBULATORY_CARE_PROVIDER_SITE_OTHER): Payer: Medicare PPO

## 2021-02-14 DIAGNOSIS — E538 Deficiency of other specified B group vitamins: Secondary | ICD-10-CM

## 2021-02-14 MED ORDER — CYANOCOBALAMIN 1000 MCG/ML IJ SOLN
1000.0000 ug | Freq: Once | INTRAMUSCULAR | Status: AC
  Administered 2021-02-14: 1000 ug via INTRAMUSCULAR

## 2021-02-14 NOTE — Progress Notes (Addendum)
Per orders of Dr. Lorelei Pont, in the absence of Dr. Damita Dunnings, injection of B12, given by Loreen Freud. Patient tolerated injection well.

## 2021-02-15 ENCOUNTER — Ambulatory Visit (INDEPENDENT_AMBULATORY_CARE_PROVIDER_SITE_OTHER): Payer: Medicare PPO | Admitting: Family Medicine

## 2021-02-15 ENCOUNTER — Encounter: Payer: Self-pay | Admitting: Family Medicine

## 2021-02-15 VITALS — BP 150/84 | HR 64 | Temp 98.1°F | Ht 71.0 in | Wt 229.0 lb

## 2021-02-15 DIAGNOSIS — I10 Essential (primary) hypertension: Secondary | ICD-10-CM | POA: Diagnosis not present

## 2021-02-15 DIAGNOSIS — N400 Enlarged prostate without lower urinary tract symptoms: Secondary | ICD-10-CM

## 2021-02-15 LAB — VITAMIN B12: Vitamin B-12: 477 pg/mL (ref 211–911)

## 2021-02-15 MED ORDER — DOXAZOSIN MESYLATE 1 MG PO TABS: 1.0000 mg | ORAL_TABLET | Freq: Every day | ORAL | 1 refills | Status: DC

## 2021-02-15 NOTE — Progress Notes (Signed)
This visit occurred during the SARS-CoV-2 public health emergency.  Safety protocols were in place, including screening questions prior to the visit, additional usage of staff PPE, and extensive cleaning of exam room while observing appropriate contact time as indicated for disinfecting solutions.  He had TURP with subsequent third degree heart block with hypotension.  He subsequently converted back to normal sinus rhythm and was stable post procedurally.  He was admitted to the telemetry unit cardiac consultation was obtained.  Echocardiogram was performed showed essentially normal left ventricular function some mild aortic calcification but patient had no further heart block or rhythm abnormality first postoperative night.  He is off verapamil currently.  He had rx for hydralazine, was prev on TID based on last phone note.  No CP with exertion.  BP was elevated at urology OV this week.  Blood pressure improved today.He has slow urine stream at night.  He has cardiology follow-up pending.    Echo discussed with patient.  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  Goal for blood pressure control in the meantime.  Meds, vitals, and allergies reviewed.   ROS: Per HPI unless specifically indicated in ROS section   GEN: nad, alert and oriented HEENT: ncat NECK: supple w/o LA CV: rrr.  PULM: ctab, no inc wob ABD: soft, +bs EXT: no edema SKIN: Well-perfused.

## 2021-02-15 NOTE — Telephone Encounter (Signed)
Noted. Thanks.

## 2021-02-15 NOTE — Patient Instructions (Addendum)
I would not take verapamil.   If you can't get set up with cardiology then let me know.    In the meantime, start doxazosin.  Start with 1 tab a day.  If BP persistently >140/>90, then increase by 1 tab every 2-3 days.    Go to the lab on the way out.   If you have mychart we'll likely use that to update you.    Update me next week, sooner if needed.  Take care.  Glad to see you.

## 2021-02-15 NOTE — Telephone Encounter (Signed)
[place] Day - Client TELEPHONE ADVICE RECORD [facility] Patient Name: Richard King [place] Gender: Male DOB: 09-25-1940 Age: 80 Y 64 M 2 D Return Phone Number: [phone] (Primary) Address: City/ State/ Zip: [place] [address] Client [facility] - Client Client Site [place] Physician Renford Dills - MD Contact Type Call Who Is Calling Patient / Member / Family / Caregiver Call Type Triage / Clinical Relationship To Patient Self Return Phone Number [phone] (Primary) Chief Complaint Blood Pressure High Reason for Call Symptomatic / Request for Richard King states that he is having problems with his blood pressure, he spoke to the dr a few days ago and they adjusted his medication but his blood pressure is still high, the last reading was 165/98 Translation No Nurse Assessment Nurse: Brion Aliment, RN, Mickel Baas Date/Time Eilene Ghazi Time): 02/14/2021 4:18:05 PM Confirm and document reason for call. If symptomatic, describe symptoms. ---Caller states that he is having problems with his blood pressure, he spoke to the dr a few days ago and they adjusted his medication but his blood pressure is still high, the last reading was 165/98 about 30 min ago, does not check BP at home anymore. He was taking Verapamil '120mg'$  QD, now is on Hydralazine '10mg'$  TID. Symptoms headache now, pain scale 2/10, headache was worse on Monday. Does the patient have any new or worsening symptoms? ---Yes Will a triage be completed? ---Yes Related visit to physician within the last 2 weeks? ---Yes Does the PT have any chronic conditions? (i.e. diabetes, asthma, this includes High risk factors for pregnancy, etc.) ---Yes List chronic conditions. ---HTN Is this a behavioral health or substance abuse call? ---No Guidelines Guideline Title Affirmed Question Affirmed Notes Nurse Date/Time  (Eastern Time) Blood Pressure - High [id number] Systolic BP >= [id number] OR Diastolic >= 80 AND [id number] taking BP medications Glynis Smiles 02/14/2021 4:24:23 PM PLEASE NOTE: All timestamps contained within this report are represented as [place] Standard Time. CONFIDENTIALTY NOTICE: This fax transmission is intended only for the addressee. It contains information that is legally privileged, confidential or otherwise protected from use or disclosure. If you are not the intended recipient, you are strictly prohibited from reviewing, disclosing, copying using or disseminating any of this information or taking any action in reliance on or regarding this information. If you have received this fax in error, please notify us immediately by telephone so that we can arrange for its return to [place]. Phone: [phone], Toll-Free: [phone], Fax: [phone] Page: 2 of 2 Call Id: [id number] [place]. Time Eilene Ghazi Time) Disposition Final User 02/14/2021 4:33:07 PM See PCP within 2 Weeks Yes Brion Aliment, RN, Elige Radon Disagree/Comply Comply Caller Understands Yes PreDisposition Call Doctor Care Advice Given Per Guideline SEE PCP WITHIN 2 WEEKS: * You need to be seen for this ongoing problem within the next 2 weeks. * PCP VISIT: Call your doctor (or NP/PA) during regular office hours and make an appointment. REASSURANCE AND EDUCATION: * Your blood pressure is elevated but you have told me that you are not having any symptoms. * You should see your doctor and have your blood pressure checked within 2 weeks. * You might need to have an adjustment in your medication(s). CALL BACK IF: * Weakness or numbness of the face, arm or leg on one side of the body occurs * Difficulty walking, difficulty talking, or severe headache occurs * Chest pain or difficulty breathing occurs * Your blood pressure  is over 160/100 * You become worse CARE ADVICE given per High Blood Pressure (Adult) guideline. Comments User: Glenetta Borg,  RN Date/Time Eilene Ghazi Time): 02/14/2021 4:25:30 PM started taking new dosage of 3 pills of hydralazine on monday User: Glenetta Borg, RN Date/Time Eilene Ghazi Time): 02/14/2021 4:30:29 PM has appointment with cardiology next Tuesday, and with PCP next Thursday Referrals REFERRED TO PCP OFFICE

## 2021-02-15 NOTE — Telephone Encounter (Addendum)
I spoke with pt; pt said feels better today than he did yesterday;pt said has slight H/A in back of head; on 02/14/21 had bad H/A in front of head behind his eyes.pt does not have a way to ck BP.No CP or SOB; had vertigo for 2 - 3 years and has dizziness on and off; no dizziness at this time. Pt is very concerned about BP staying up and bad H/A on and off. Pt scheduled appt with Dr Damita Dunnings 02/15/21 at [facility]. UC & ED precautions given and pt voiced understanding.sending note to Dr Damita Dunnings and Janett Billow CMA.

## 2021-02-16 LAB — CBC WITH DIFFERENTIAL/PLATELET
Basophils Absolute: 0 10*3/uL (ref 0.0–0.1)
Basophils Relative: 0.7 % (ref 0.0–3.0)
Eosinophils Absolute: 0.2 10*3/uL (ref 0.0–0.7)
Eosinophils Relative: 2.7 % (ref 0.0–5.0)
HCT: 41.1 % (ref 39.0–52.0)
Hemoglobin: 13.9 g/dL (ref 13.0–17.0)
Lymphocytes Relative: 22.3 % (ref 12.0–46.0)
Lymphs Abs: 1.3 10*3/uL (ref 0.7–4.0)
MCHC: 33.8 g/dL (ref 30.0–36.0)
MCV: 94.1 fl (ref 78.0–100.0)
Monocytes Absolute: 0.7 10*3/uL (ref 0.1–1.0)
Monocytes Relative: 12.5 % — ABNORMAL HIGH (ref 3.0–12.0)
Neutro Abs: 3.6 10*3/uL (ref 1.4–7.7)
Neutrophils Relative %: 61.8 % (ref 43.0–77.0)
Platelets: 263 10*3/uL (ref 150.0–400.0)
RBC: 4.37 Mil/uL (ref 4.22–5.81)
RDW: 13.4 % (ref 11.5–15.5)
WBC: 5.8 10*3/uL (ref 4.0–10.5)

## 2021-02-16 LAB — BASIC METABOLIC PANEL
BUN: 12 mg/dL (ref 6–23)
CO2: 26 mEq/L (ref 19–32)
Calcium: 9.8 mg/dL (ref 8.4–10.5)
Chloride: 105 mEq/L (ref 96–112)
Creatinine, Ser: 0.89 mg/dL (ref 0.40–1.50)
GFR: 80.84 mL/min (ref >60.00)
Glucose, Bld: 94 mg/dL (ref 70–99)
Potassium: 4.5 mEq/L (ref 3.5–5.1)
Sodium: 139 mEq/L (ref 135–145)

## 2021-02-18 NOTE — Assessment & Plan Note (Signed)
Reasonable to use doxazosin in this situation.  See hypertension discussion.

## 2021-02-18 NOTE — Assessment & Plan Note (Addendum)
I advised patient not to take verapamil. If for some reason he can have follow-up with cardiology, then I want him to let me know. In the meantime, start doxazosin.  Stop hydralazine.  Start with 1 tab a day.  If BP persistently >140/>90, then increase by 1 tab every 2-3 days.   See notes on labs. I asked him to update me next week, sooner if needed.  33 minutes were devoted to patient care in this encounter (this includes time spent reviewing the patient's file/history, interviewing and examining the patient, counseling/reviewing plan with patient).

## 2021-02-20 DIAGNOSIS — E6609 Other obesity due to excess calories: Secondary | ICD-10-CM | POA: Diagnosis not present

## 2021-02-20 DIAGNOSIS — I4519 Other right bundle-branch block: Secondary | ICD-10-CM | POA: Diagnosis not present

## 2021-02-20 DIAGNOSIS — I10 Essential (primary) hypertension: Secondary | ICD-10-CM | POA: Diagnosis not present

## 2021-02-20 DIAGNOSIS — Z9079 Acquired absence of other genital organ(s): Secondary | ICD-10-CM | POA: Diagnosis not present

## 2021-02-22 ENCOUNTER — Encounter: Payer: Self-pay | Admitting: Family Medicine

## 2021-02-22 ENCOUNTER — Ambulatory Visit: Payer: Medicare PPO | Admitting: Family Medicine

## 2021-02-22 ENCOUNTER — Other Ambulatory Visit: Payer: Self-pay

## 2021-02-22 ENCOUNTER — Telehealth: Payer: Self-pay

## 2021-02-22 VITALS — BP 130/72 | HR 75 | Temp 98.1°F | Ht 71.0 in | Wt 227.0 lb

## 2021-02-22 DIAGNOSIS — Z23 Encounter for immunization: Secondary | ICD-10-CM | POA: Diagnosis not present

## 2021-02-22 DIAGNOSIS — I10 Essential (primary) hypertension: Secondary | ICD-10-CM

## 2021-02-22 MED ORDER — DOXAZOSIN MESYLATE 4 MG PO TABS: 4.0000 mg | ORAL_TABLET | Freq: Every day | ORAL | 3 refills | Status: DC

## 2021-02-22 NOTE — Progress Notes (Signed)
This visit occurred during the SARS-CoV-2 public health emergency.  Safety protocols were in place, including screening questions prior to the visit, additional usage of staff PPE, and extensive cleaning of exam room while observing appropriate contact time as indicated for disinfecting solutions.  Hypertension:    Using medication without problems or lightheadedness:  yes but can get lightheaded with leaning over.   Chest pain with exertion:no Edema: no Short of breath: not worse than prev baseline from years ago- stable "and I've been like that for years." His urine stream is better and he isn't getting up at night as much with nocturia.   He is taking 4mg  doxazosin.   Advised him not to take verapamil.    Occ headaches, prev used Goody's.  D/w pt about trial of tylenol.  Variable location of HA prev.  BP is normalized now.    Sleep d/w pt.  Used melatonin recently with relief, 5mg .  It helped, no ADE on med.  Reasonable to continue as needed use.  He was asking if it was okay to use ensure, it should be okay to use.  He had some leftover cans.  D/w pt.  He is cutting back on white bread in the meantime.    Meds, vitals, and allergies reviewed.  ROS: Per HPI unless specifically indicated in ROS section   GEN: nad, alert and oriented HEENT: ncat NECK: supple w/o LA CV: rrr. PULM: ctab, no inc wob ABD: soft, +bs EXT: no edema SKIN: Well-perfused.

## 2021-02-22 NOTE — Telephone Encounter (Signed)
PA started in covermymeds.com for doxazosin 4 mg tablets. Key: [id number]; awaiting determination.

## 2021-02-22 NOTE — Patient Instructions (Addendum)
Ask about scheduling B12 shots every 2 weeks at [facility]. Due next week.     New rx for doxazosin sent in.  It will have 4mg  in 1 tablet.   If the lightheadedness continues, then let me know.   Try tylenol if needed for the headaches.   You can try melatonin 5mg  at night if needed for sleep.   Take care.  Glad to see you.

## 2021-02-23 NOTE — Telephone Encounter (Signed)
PA approved until 06/02/2021

## 2021-02-25 NOTE — Assessment & Plan Note (Signed)
His urine stream is better and he isn't getting up at night as much with nocturia.   He is taking 4mg  doxazosin.   Advised him not to take verapamil.   Continue amlodipine and losartan.  Occ headaches, prev used Goody's.  D/w pt about trial of tylenol.  Variable location of HA prev.  BP is normalized now.  He will update me as needed.

## 2021-02-26 DIAGNOSIS — H2000 Unspecified acute and subacute iridocyclitis: Secondary | ICD-10-CM | POA: Diagnosis not present

## 2021-02-27 DIAGNOSIS — Z9079 Acquired absence of other genital organ(s): Secondary | ICD-10-CM | POA: Diagnosis not present

## 2021-02-27 DIAGNOSIS — I10 Essential (primary) hypertension: Secondary | ICD-10-CM | POA: Diagnosis not present

## 2021-02-27 DIAGNOSIS — E6609 Other obesity due to excess calories: Secondary | ICD-10-CM | POA: Diagnosis not present

## 2021-02-27 DIAGNOSIS — I440 Atrioventricular block, first degree: Secondary | ICD-10-CM | POA: Diagnosis not present

## 2021-02-28 DIAGNOSIS — I10 Essential (primary) hypertension: Secondary | ICD-10-CM | POA: Diagnosis not present

## 2021-02-28 DIAGNOSIS — F324 Major depressive disorder, single episode, in partial remission: Secondary | ICD-10-CM | POA: Diagnosis not present

## 2021-02-28 DIAGNOSIS — E669 Obesity, unspecified: Secondary | ICD-10-CM | POA: Diagnosis not present

## 2021-02-28 DIAGNOSIS — N400 Enlarged prostate without lower urinary tract symptoms: Secondary | ICD-10-CM | POA: Diagnosis not present

## 2021-02-28 DIAGNOSIS — Z6832 Body mass index (BMI) 32.0-32.9, adult: Secondary | ICD-10-CM | POA: Diagnosis not present

## 2021-02-28 DIAGNOSIS — M1990 Unspecified osteoarthritis, unspecified site: Secondary | ICD-10-CM | POA: Diagnosis not present

## 2021-02-28 DIAGNOSIS — F419 Anxiety disorder, unspecified: Secondary | ICD-10-CM | POA: Diagnosis not present

## 2021-02-28 DIAGNOSIS — R32 Unspecified urinary incontinence: Secondary | ICD-10-CM | POA: Diagnosis not present

## 2021-02-28 DIAGNOSIS — G8929 Other chronic pain: Secondary | ICD-10-CM | POA: Diagnosis not present

## 2021-03-06 ENCOUNTER — Other Ambulatory Visit: Payer: Self-pay

## 2021-03-06 ENCOUNTER — Ambulatory Visit (INDEPENDENT_AMBULATORY_CARE_PROVIDER_SITE_OTHER): Payer: Medicare PPO

## 2021-03-06 DIAGNOSIS — E538 Deficiency of other specified B group vitamins: Secondary | ICD-10-CM

## 2021-03-06 MED ORDER — CYANOCOBALAMIN 1000 MCG/ML IJ SOLN
1000.0000 ug | Freq: Once | INTRAMUSCULAR | Status: AC
  Administered 2021-03-06: 1000 ug via INTRAMUSCULAR

## 2021-03-06 NOTE — Progress Notes (Signed)
Per orders of Dr. Damita Dunnings, injection of every 2 weeks B12 1000 mcg/ml given by Pilar Grammes, CMA in Right Deltoid. Patient tolerated injection well.  Asked if I would check his BP and it was 136/84.

## 2021-03-15 ENCOUNTER — Other Ambulatory Visit: Payer: Self-pay | Admitting: Family Medicine

## 2021-03-16 DIAGNOSIS — N401 Enlarged prostate with lower urinary tract symptoms: Secondary | ICD-10-CM | POA: Diagnosis not present

## 2021-03-16 DIAGNOSIS — R3914 Feeling of incomplete bladder emptying: Secondary | ICD-10-CM | POA: Diagnosis not present

## 2021-03-16 DIAGNOSIS — R8279 Other abnormal findings on microbiological examination of urine: Secondary | ICD-10-CM | POA: Diagnosis not present

## 2021-03-20 ENCOUNTER — Ambulatory Visit (INDEPENDENT_AMBULATORY_CARE_PROVIDER_SITE_OTHER): Payer: Medicare PPO

## 2021-03-20 ENCOUNTER — Other Ambulatory Visit: Payer: Self-pay

## 2021-03-20 DIAGNOSIS — E538 Deficiency of other specified B group vitamins: Secondary | ICD-10-CM

## 2021-03-20 MED ORDER — CYANOCOBALAMIN 1000 MCG/ML IJ SOLN
1000.0000 ug | Freq: Once | INTRAMUSCULAR | Status: AC
  Administered 2021-03-20: 1000 ug via INTRAMUSCULAR

## 2021-03-20 NOTE — Progress Notes (Signed)
Per orders of Dr. Damita Dunnings, injection of B12 was given by Ophelia Shoulder. Patient tolerated injection well.

## 2021-03-29 DIAGNOSIS — Z9079 Acquired absence of other genital organ(s): Secondary | ICD-10-CM | POA: Diagnosis not present

## 2021-03-29 DIAGNOSIS — I10 Essential (primary) hypertension: Secondary | ICD-10-CM | POA: Diagnosis not present

## 2021-03-29 DIAGNOSIS — E6609 Other obesity due to excess calories: Secondary | ICD-10-CM | POA: Diagnosis not present

## 2021-03-29 DIAGNOSIS — I440 Atrioventricular block, first degree: Secondary | ICD-10-CM | POA: Diagnosis not present

## 2021-04-03 ENCOUNTER — Ambulatory Visit (INDEPENDENT_AMBULATORY_CARE_PROVIDER_SITE_OTHER): Payer: Medicare PPO

## 2021-04-03 ENCOUNTER — Other Ambulatory Visit: Payer: Self-pay

## 2021-04-03 DIAGNOSIS — E538 Deficiency of other specified B group vitamins: Secondary | ICD-10-CM

## 2021-04-03 MED ORDER — CYANOCOBALAMIN 1000 MCG/ML IJ SOLN
1000.0000 ug | Freq: Once | INTRAMUSCULAR | Status: AC
  Administered 2021-04-03: 1000 ug via INTRAMUSCULAR

## 2021-04-03 NOTE — Progress Notes (Signed)
Per orders of Dr. Duncan, injection of every 2 weeks B12 1000 mcg/ml given by Karren Newland P Maximiano Lott, CMA in Right Deltoid. Patient tolerated injection well.  

## 2021-04-17 ENCOUNTER — Other Ambulatory Visit: Payer: Self-pay

## 2021-04-17 ENCOUNTER — Ambulatory Visit (INDEPENDENT_AMBULATORY_CARE_PROVIDER_SITE_OTHER): Payer: Medicare PPO

## 2021-04-17 DIAGNOSIS — E538 Deficiency of other specified B group vitamins: Secondary | ICD-10-CM | POA: Diagnosis not present

## 2021-04-17 MED ORDER — CYANOCOBALAMIN 1000 MCG/ML IJ SOLN
1000.0000 ug | Freq: Once | INTRAMUSCULAR | Status: AC
  Administered 2021-04-17: 1000 ug via INTRAMUSCULAR

## 2021-04-17 NOTE — Progress Notes (Signed)
Per orders of Dr. Damita Dunnings, injection of B12 given by Loreen Freud. Patient tolerated injection well. Pt receives B12 injections every 2 weeks.

## 2021-05-01 ENCOUNTER — Other Ambulatory Visit: Payer: Self-pay

## 2021-05-01 ENCOUNTER — Ambulatory Visit (INDEPENDENT_AMBULATORY_CARE_PROVIDER_SITE_OTHER): Payer: Medicare PPO

## 2021-05-01 DIAGNOSIS — E538 Deficiency of other specified B group vitamins: Secondary | ICD-10-CM | POA: Diagnosis not present

## 2021-05-01 MED ORDER — CYANOCOBALAMIN 1000 MCG/ML IJ SOLN
1000.0000 ug | Freq: Once | INTRAMUSCULAR | Status: AC
  Administered 2021-05-01: 1000 ug via INTRAMUSCULAR

## 2021-05-01 NOTE — Progress Notes (Signed)
Per orders of Dr. Damita Dunnings, injection of B12 given by Loreen Freud. B12 shots every 2 weeks. Patient tolerated injection well.

## 2021-05-15 ENCOUNTER — Ambulatory Visit (INDEPENDENT_AMBULATORY_CARE_PROVIDER_SITE_OTHER): Payer: Medicare PPO

## 2021-05-15 ENCOUNTER — Other Ambulatory Visit: Payer: Self-pay

## 2021-05-15 DIAGNOSIS — E538 Deficiency of other specified B group vitamins: Secondary | ICD-10-CM

## 2021-05-15 DIAGNOSIS — Z23 Encounter for immunization: Secondary | ICD-10-CM

## 2021-05-15 MED ORDER — CYANOCOBALAMIN 1000 MCG/ML IJ SOLN
1000.0000 ug | Freq: Once | INTRAMUSCULAR | Status: AC
  Administered 2021-05-15: 1000 ug via INTRAMUSCULAR

## 2021-05-15 NOTE — Progress Notes (Signed)
Per orders of Dr. Danise Mina, in Dr. Josefine Class absence, bi-weekly injection of b12 given by Loreen Freud. Patient tolerated injection well.

## 2021-05-29 ENCOUNTER — Ambulatory Visit (INDEPENDENT_AMBULATORY_CARE_PROVIDER_SITE_OTHER): Payer: Medicare PPO

## 2021-05-29 ENCOUNTER — Other Ambulatory Visit: Payer: Self-pay

## 2021-05-29 DIAGNOSIS — E538 Deficiency of other specified B group vitamins: Secondary | ICD-10-CM

## 2021-05-29 MED ORDER — CYANOCOBALAMIN 1000 MCG/ML IJ SOLN
1000.0000 ug | Freq: Once | INTRAMUSCULAR | Status: AC
  Administered 2021-05-29: 14:00:00 1000 ug via INTRAMUSCULAR

## 2021-05-29 NOTE — Progress Notes (Signed)
Per orders of Dr. Glori Bickers, in Dr. Josefine Class absence, biweekly injection of B12 given by Loreen Freud. Patient tolerated injection well.

## 2021-06-08 DIAGNOSIS — R351 Nocturia: Secondary | ICD-10-CM | POA: Diagnosis not present

## 2021-06-08 DIAGNOSIS — N401 Enlarged prostate with lower urinary tract symptoms: Secondary | ICD-10-CM | POA: Diagnosis not present

## 2021-06-13 ENCOUNTER — Other Ambulatory Visit: Payer: Self-pay

## 2021-06-13 ENCOUNTER — Ambulatory Visit (INDEPENDENT_AMBULATORY_CARE_PROVIDER_SITE_OTHER): Payer: Medicare PPO

## 2021-06-13 DIAGNOSIS — E538 Deficiency of other specified B group vitamins: Secondary | ICD-10-CM | POA: Diagnosis not present

## 2021-06-13 MED ORDER — CYANOCOBALAMIN 1000 MCG/ML IJ SOLN
1000.0000 ug | Freq: Once | INTRAMUSCULAR | Status: AC
  Administered 2021-06-13: 1000 ug via INTRAMUSCULAR

## 2021-06-13 NOTE — Progress Notes (Signed)
Per orders of Romilda Garret, NP, in Dr. Josefine Class absence, bi-weekly injection of B12 given by Loreen Freud. Patient tolerated injection well.

## 2021-06-14 ENCOUNTER — Ambulatory Visit: Payer: Medicare PPO | Admitting: Family Medicine

## 2021-06-14 ENCOUNTER — Encounter: Payer: Self-pay | Admitting: Family Medicine

## 2021-06-14 VITALS — BP 140/74 | HR 79 | Temp 97.8°F | Ht 71.0 in | Wt 242.0 lb

## 2021-06-14 DIAGNOSIS — R609 Edema, unspecified: Secondary | ICD-10-CM

## 2021-06-14 LAB — BASIC METABOLIC PANEL
BUN: 16 mg/dL (ref 6–23)
CO2: 31 mEq/L (ref 19–32)
Calcium: 9.5 mg/dL (ref 8.4–10.5)
Chloride: 104 mEq/L (ref 96–112)
Creatinine, Ser: 0.95 mg/dL (ref 0.40–1.50)
GFR: 75.34 mL/min (ref >60.00)
Glucose, Bld: 93 mg/dL (ref 70–99)
Potassium: 4.4 mEq/L (ref 3.5–5.1)
Sodium: 140 mEq/L (ref 135–145)

## 2021-06-14 MED ORDER — SERTRALINE HCL 50 MG PO TABS: 50.0000 mg | ORAL_TABLET | Freq: Every day | ORAL | 3 refills | Status: DC

## 2021-06-14 NOTE — Patient Instructions (Addendum)
Go to the lab on the way out.   If you have mychart we'll likely use that to update you.    Take care.  Glad to see you.  If your labs are fine, then we may need to change your amlodipine.  That can sometimes cause ankle swelling.  We may need to stop amlodipine and increase losartan.    Try to prop your feet up during the day.

## 2021-06-14 NOTE — Progress Notes (Signed)
This visit occurred during the SARS-CoV-2 public health emergency.  Safety protocols were in place, including screening questions prior to the visit, additional usage of staff PPE, and extensive cleaning of exam room while observing appropriate contact time as indicated for disinfecting solutions.  BLE edema.  At the ankles.  Going on for years episodically.  Weight is up.  D/w pt.  He is less active and eating more.  L>R leg noted in the last few months.  He always had L>R edema at baseline but noted more recently.  Not more SOB.  No CP.  Edema is better in the AM, noted more at the day goes on.   Needed refill on sertraline.  Rx done at [place].   BP 120-150s/60-80s at home, per patient report.   Meds, vitals, and allergies reviewed.   ROS: Per HPI unless specifically indicated in ROS section   Nad Ncat Neck supple, no LA Rrr Ctab Abd soft, not ttp Skin well perfused.  L>R pitting edema, 1+.

## 2021-06-17 ENCOUNTER — Other Ambulatory Visit: Payer: Self-pay | Admitting: Family Medicine

## 2021-06-17 DIAGNOSIS — R609 Edema, unspecified: Secondary | ICD-10-CM | POA: Insufficient documentation

## 2021-06-17 MED ORDER — LOSARTAN POTASSIUM 50 MG PO TABS: 100.0000 mg | ORAL_TABLET | Freq: Every day | ORAL | Status: DC

## 2021-06-17 NOTE — Assessment & Plan Note (Addendum)
See notes on labs.  We may need to have him stop amlodipine and increase his losartan.  Lungs are clear.  Still okay for outpatient follow-up.  He agrees with plan.  Discussed elevating his legs when possible.

## 2021-06-27 ENCOUNTER — Ambulatory Visit (INDEPENDENT_AMBULATORY_CARE_PROVIDER_SITE_OTHER): Payer: Medicare PPO

## 2021-06-27 ENCOUNTER — Other Ambulatory Visit: Payer: Self-pay

## 2021-06-27 DIAGNOSIS — E538 Deficiency of other specified B group vitamins: Secondary | ICD-10-CM

## 2021-06-27 MED ORDER — CYANOCOBALAMIN 1000 MCG/ML IJ SOLN
1000.0000 ug | Freq: Once | INTRAMUSCULAR | Status: AC
  Administered 2021-06-27: 15:00:00 1000 ug via INTRAMUSCULAR

## 2021-06-27 NOTE — Progress Notes (Signed)
Per orders of Dr. Damita Dunnings, an injection of B12 was given by Ophelia Shoulder, CMA in the right deltoid. Patient tolerated injection well.

## 2021-07-09 ENCOUNTER — Telehealth: Payer: Self-pay

## 2021-07-09 MED ORDER — LOSARTAN POTASSIUM 50 MG PO TABS: 50.0000 mg | ORAL_TABLET | Freq: Every day | ORAL | 1 refills | Status: DC

## 2021-07-09 MED ORDER — LOSARTAN POTASSIUM 50 MG PO TABS: 50.0000 mg | ORAL_TABLET | Freq: Every day | ORAL | Status: DC

## 2021-07-09 MED ORDER — HYDROCHLOROTHIAZIDE 12.5 MG PO CAPS: 12.5000 mg | ORAL_CAPSULE | Freq: Every day | ORAL | 3 refills | Status: DC

## 2021-07-09 NOTE — Telephone Encounter (Signed)
Patient called to confirm his nurse visit for b 12 on 2/8 it was cancelled and there is not a schedule open for me to make later in the week. Please advise. Informed we will call him as soon as we have an answer.

## 2021-07-09 NOTE — Addendum Note (Signed)
Addended by: Sherrilee Gilles B on: 07/09/2021 05:15 PM   Modules accepted: Orders

## 2021-07-09 NOTE — Telephone Encounter (Signed)
Patient advised about medication changes and verbalized understanding.

## 2021-07-09 NOTE — Telephone Encounter (Addendum)
Please call pt.  Would stay off amlodipine, cut losartan back to 50mg  (1 tab a day) and start HCTZ 12.5mg  in the AMs.  See if that helps with swelling and update me in about 2 weeks, sooner if needed.  Rx sent.  Thanks.

## 2021-07-09 NOTE — Addendum Note (Signed)
Addended by: Tonia Ghent on: 07/09/2021 05:05 PM   Modules accepted: Orders

## 2021-07-09 NOTE — Telephone Encounter (Signed)
Patient called office. Was seen 3 weeks ago and told to follow up and let you know how swelling had been going. States that the swelling and pain has not improved. Home readings have been 130-150 to 60-70.

## 2021-07-11 ENCOUNTER — Ambulatory Visit: Payer: Medicare PPO

## 2021-07-13 NOTE — Telephone Encounter (Signed)
JoEllen,  If you would like to work him in on Monday that would be great.  Sorry for the late response on this.  Let me know what his preference for time is and what works with you as you will likely be the one to cover this visit on Monday.   Let me know what works.   Thanks.

## 2021-07-16 NOTE — Telephone Encounter (Signed)
Please call to have added

## 2021-07-16 NOTE — Telephone Encounter (Signed)
Scheduled

## 2021-07-23 ENCOUNTER — Telehealth: Payer: Self-pay | Admitting: Family Medicine

## 2021-07-23 NOTE — Telephone Encounter (Signed)
Mr. Broadwell called in and wanted to update Dr. Damita Dunnings on the medication. He stated that the swelling went down a little bit not a lot and his BP the low numbers are in the 60s-70s and wanted to know what to do

## 2021-07-24 DIAGNOSIS — I440 Atrioventricular block, first degree: Secondary | ICD-10-CM | POA: Diagnosis not present

## 2021-07-24 DIAGNOSIS — Z9079 Acquired absence of other genital organ(s): Secondary | ICD-10-CM | POA: Diagnosis not present

## 2021-07-24 DIAGNOSIS — E6609 Other obesity due to excess calories: Secondary | ICD-10-CM | POA: Diagnosis not present

## 2021-07-24 DIAGNOSIS — I10 Essential (primary) hypertension: Secondary | ICD-10-CM | POA: Diagnosis not present

## 2021-07-25 ENCOUNTER — Telehealth: Payer: Self-pay

## 2021-07-25 ENCOUNTER — Ambulatory Visit (INDEPENDENT_AMBULATORY_CARE_PROVIDER_SITE_OTHER): Payer: Medicare PPO

## 2021-07-25 ENCOUNTER — Other Ambulatory Visit: Payer: Self-pay

## 2021-07-25 DIAGNOSIS — E538 Deficiency of other specified B group vitamins: Secondary | ICD-10-CM | POA: Diagnosis not present

## 2021-07-25 MED ORDER — CYANOCOBALAMIN 1000 MCG/ML IJ SOLN
1000.0000 ug | Freq: Once | INTRAMUSCULAR | Status: AC
  Administered 2021-07-25: 1000 ug via INTRAMUSCULAR

## 2021-07-25 NOTE — Progress Notes (Signed)
Patient presented for B 12 injection given by Hussain Maimone, CMA to left deltoid, patient voiced no concerns nor showed any signs of distress during injection.  

## 2021-07-25 NOTE — Telephone Encounter (Signed)
Patient came in today for B12 injection and wanted to know if we could order labs that his cardiologist wanted done?   Patient needs CBC w/diff, BMET, lipid panel, Liver function test and PSA done. I have also placed sheet patient brought in with him for reference in your inbox. Please advise.

## 2021-07-25 NOTE — Telephone Encounter (Signed)
What are his systolic readings and is the edema manageable as is?  Please let me know.  Thanks.

## 2021-07-25 NOTE — Telephone Encounter (Addendum)
Cardiology should be able to place the orders in the EMR/collect the labs if needed.  If patient needs a PSA then urology should be able to arrange that at follow up.  I would defer to them.  Thanks.

## 2021-07-27 NOTE — Telephone Encounter (Signed)
Called patient sates that his systolic numbers have been in the 140's. He states that the swelling has gone down a little bit but is till there.

## 2021-07-27 NOTE — Telephone Encounter (Signed)
Patient will contact cardiology and urology for labs.

## 2021-07-30 MED ORDER — HYDROCHLOROTHIAZIDE 12.5 MG PO CAPS: 25.0000 mg | ORAL_CAPSULE | Freq: Every day | ORAL | Status: DC

## 2021-07-30 NOTE — Telephone Encounter (Signed)
Called and notified patient about medication change and patient verbalized understanding. Advised patient to update [place] if that does not help.

## 2021-07-30 NOTE — Addendum Note (Signed)
Addended by: Tonia Ghent on: 07/30/2021 10:23 AM   Modules accepted: Orders

## 2021-07-30 NOTE — Telephone Encounter (Signed)
I would change hydrochlorothiazide to 2 tablets (25 mg) taken together in the morning.  That should help lower his blood pressure some and also help with swelling.  Thanks.  Please update me as needed.

## 2021-08-08 ENCOUNTER — Ambulatory Visit (INDEPENDENT_AMBULATORY_CARE_PROVIDER_SITE_OTHER): Payer: Medicare PPO

## 2021-08-08 ENCOUNTER — Other Ambulatory Visit: Payer: Self-pay

## 2021-08-08 DIAGNOSIS — E538 Deficiency of other specified B group vitamins: Secondary | ICD-10-CM

## 2021-08-08 MED ORDER — CYANOCOBALAMIN 1000 MCG/ML IJ SOLN
1000.0000 ug | Freq: Once | INTRAMUSCULAR | Status: AC
  Administered 2021-08-08: 1000 ug via INTRAMUSCULAR

## 2021-08-08 NOTE — Progress Notes (Signed)
Per orders of Romilda Garret, NP in the absence of Dr. Damita Dunnings, bi-weekly injection of b12 given in the R deltoid, by Loreen Freud. ?Patient tolerated injection well.  ?

## 2021-08-14 DIAGNOSIS — N429 Disorder of prostate, unspecified: Secondary | ICD-10-CM | POA: Diagnosis not present

## 2021-08-14 DIAGNOSIS — E7849 Other hyperlipidemia: Secondary | ICD-10-CM | POA: Diagnosis not present

## 2021-08-14 DIAGNOSIS — Z79899 Other long term (current) drug therapy: Secondary | ICD-10-CM | POA: Diagnosis not present

## 2021-08-14 DIAGNOSIS — D649 Anemia, unspecified: Secondary | ICD-10-CM | POA: Diagnosis not present

## 2021-08-14 DIAGNOSIS — E876 Hypokalemia: Secondary | ICD-10-CM | POA: Diagnosis not present

## 2021-08-23 ENCOUNTER — Other Ambulatory Visit: Payer: Self-pay

## 2021-08-23 ENCOUNTER — Ambulatory Visit (INDEPENDENT_AMBULATORY_CARE_PROVIDER_SITE_OTHER): Payer: Medicare PPO

## 2021-08-23 DIAGNOSIS — E538 Deficiency of other specified B group vitamins: Secondary | ICD-10-CM | POA: Diagnosis not present

## 2021-08-23 MED ORDER — CYANOCOBALAMIN 1000 MCG/ML IJ SOLN
1000.0000 ug | Freq: Once | INTRAMUSCULAR | Status: AC
  Administered 2021-08-23: 1000 ug via INTRAMUSCULAR

## 2021-08-23 NOTE — Progress Notes (Signed)
Pt came in today to receive his vit b12 injection, pt was given injection in the left deltoid IM successfully without any concerns ?

## 2021-09-06 ENCOUNTER — Telehealth: Payer: Self-pay | Admitting: Family Medicine

## 2021-09-06 ENCOUNTER — Ambulatory Visit (INDEPENDENT_AMBULATORY_CARE_PROVIDER_SITE_OTHER): Payer: Medicare PPO

## 2021-09-06 DIAGNOSIS — E538 Deficiency of other specified B group vitamins: Secondary | ICD-10-CM | POA: Diagnosis not present

## 2021-09-06 MED ORDER — CYANOCOBALAMIN 1000 MCG/ML IJ SOLN
1000.0000 ug | Freq: Once | INTRAMUSCULAR | Status: AC
  Administered 2021-09-06: 1000 ug via INTRAMUSCULAR

## 2021-09-06 MED ORDER — LOSARTAN POTASSIUM 50 MG PO TABS: 50.0000 mg | ORAL_TABLET | Freq: Every day | ORAL | 1 refills | Status: DC

## 2021-09-06 NOTE — Telephone Encounter (Signed)
Pt dropped off a note for Dr. Damita Dunnings about his medication refill. He was seen today by the nurse. I will put it in his box. ?

## 2021-09-06 NOTE — Telephone Encounter (Signed)
Patient left note that he needed losartan refilled. I have sent in his refill to [facility] ?

## 2021-09-06 NOTE — Progress Notes (Signed)
Per orders of Dr. Duncan, injection of vit B12 given by Deashia Soule. Patient tolerated injection well.  

## 2021-09-06 NOTE — Addendum Note (Signed)
Addended by: Sherrilee Gilles B on: 09/06/2021 12:19 PM ? ? Modules accepted: Orders ? ?

## 2021-09-20 ENCOUNTER — Ambulatory Visit (INDEPENDENT_AMBULATORY_CARE_PROVIDER_SITE_OTHER): Payer: Medicare PPO

## 2021-09-20 DIAGNOSIS — E538 Deficiency of other specified B group vitamins: Secondary | ICD-10-CM

## 2021-09-20 MED ORDER — CYANOCOBALAMIN 1000 MCG/ML IJ SOLN
1000.0000 ug | Freq: Once | INTRAMUSCULAR | Status: AC
  Administered 2021-09-20: 1000 ug via INTRAMUSCULAR

## 2021-09-20 NOTE — Progress Notes (Signed)
Per orders of Dr. Tower, injection of vit B12 given by Aston Lieske. Patient tolerated injection well.  

## 2021-10-04 ENCOUNTER — Ambulatory Visit (INDEPENDENT_AMBULATORY_CARE_PROVIDER_SITE_OTHER): Payer: Medicare PPO

## 2021-10-04 DIAGNOSIS — E538 Deficiency of other specified B group vitamins: Secondary | ICD-10-CM | POA: Diagnosis not present

## 2021-10-04 MED ORDER — CYANOCOBALAMIN 1000 MCG/ML IJ SOLN
1000.0000 ug | Freq: Once | INTRAMUSCULAR | Status: AC
  Administered 2021-10-04: 1000 ug via INTRAMUSCULAR

## 2021-10-04 NOTE — Progress Notes (Signed)
Per orders of Dr. Damita Dunnings, bi-weekly injection of B12 given by Loreen Freud. ?Patient tolerated injection well.  ?

## 2021-10-18 ENCOUNTER — Ambulatory Visit (INDEPENDENT_AMBULATORY_CARE_PROVIDER_SITE_OTHER): Payer: Medicare PPO

## 2021-10-18 DIAGNOSIS — E538 Deficiency of other specified B group vitamins: Secondary | ICD-10-CM | POA: Diagnosis not present

## 2021-10-18 MED ORDER — CYANOCOBALAMIN 1000 MCG/ML IJ SOLN
1000.0000 ug | Freq: Once | INTRAMUSCULAR | Status: AC
  Administered 2021-10-18: 1000 ug via INTRAMUSCULAR

## 2021-10-18 NOTE — Progress Notes (Signed)
Per orders of Dr. Duncan, injection of vit B12 given by Airica Schwartzkopf. Patient tolerated injection well.  

## 2021-11-01 ENCOUNTER — Ambulatory Visit: Payer: Medicare PPO | Admitting: Family Medicine

## 2021-11-01 ENCOUNTER — Encounter: Payer: Self-pay | Admitting: Family Medicine

## 2021-11-01 ENCOUNTER — Ambulatory Visit: Payer: Medicare PPO

## 2021-11-01 VITALS — BP 122/78 | HR 67 | Temp 97.8°F | Ht 71.0 in | Wt 239.0 lb

## 2021-11-01 DIAGNOSIS — R252 Cramp and spasm: Secondary | ICD-10-CM | POA: Diagnosis not present

## 2021-11-01 DIAGNOSIS — R609 Edema, unspecified: Secondary | ICD-10-CM

## 2021-11-01 DIAGNOSIS — E538 Deficiency of other specified B group vitamins: Secondary | ICD-10-CM | POA: Diagnosis not present

## 2021-11-01 DIAGNOSIS — G4700 Insomnia, unspecified: Secondary | ICD-10-CM | POA: Diagnosis not present

## 2021-11-01 MED ORDER — HYDROCHLOROTHIAZIDE 12.5 MG PO CAPS: 12.5000 mg | ORAL_CAPSULE | Freq: Every day | ORAL | Status: DC

## 2021-11-01 MED ORDER — CYANOCOBALAMIN 1000 MCG/ML IJ SOLN
1000.0000 ug | Freq: Once | INTRAMUSCULAR | Status: AC
  Administered 2021-11-01: 1000 ug via INTRAMUSCULAR

## 2021-11-01 MED ORDER — QUININE SULFATE 324 MG PO CAPS: ORAL_CAPSULE | ORAL | Status: DC

## 2021-11-01 NOTE — Patient Instructions (Signed)
It should be okay to try 12.'5mg'$  to '25mg'$  of benadryl at night if needed for sleep.  4 oz of tonic water should be okay to continue.  Update me as needed.  Take care.  Glad to see you.

## 2021-11-01 NOTE — Progress Notes (Signed)
L ankle swelling resolved.  No edema now.  He never had to inc HCTZ to '25mg'$ .  It resolved only taking 12.'5mg'$  at baseline.    R>L leg cramps at night.  Going on for years, noted more at night.  Not claudication.  Can happen at rest.  In the upper leg and lower leg concurrently.  He started drinking tonic water and that helped.  He started that about 1 week ago.  I checked quinine vs his med list and he didn't have any interactions.    B12 def. On replacement at baseline.    Intermittent insomnia d/w pt.  D/w pt about trying tylenol PM at night vs plain Benadryl.  Meds, vitals, and allergies reviewed.   ROS: Per HPI unless specifically indicated in ROS section   GEN: nad, alert and oriented HEENT: ncat NECK: supple w/o LA CV: rrr. PULM: ctab, no inc wob ABD: soft, +bs EXT: no edema SKIN: Well-perfused.

## 2021-11-04 DIAGNOSIS — G4700 Insomnia, unspecified: Secondary | ICD-10-CM | POA: Insufficient documentation

## 2021-11-04 DIAGNOSIS — R252 Cramp and spasm: Secondary | ICD-10-CM | POA: Insufficient documentation

## 2021-11-04 NOTE — Assessment & Plan Note (Signed)
Continue replacement as is. 

## 2021-11-04 NOTE — Assessment & Plan Note (Signed)
It should be okay to try 12.'5mg'$  to '25mg'$  of benadryl at night if needed for sleep.  He can update me as needed.  Routine cautions given to patient.

## 2021-11-04 NOTE — Assessment & Plan Note (Signed)
Resolved, will continue hydrochlorothiazide 12.5 mg a day.

## 2021-11-04 NOTE — Assessment & Plan Note (Signed)
He got relief with tonic water.  4 oz of tonic water should be okay to continue.  Update me as needed.  He does not have claudication.

## 2021-11-12 DIAGNOSIS — K5730 Diverticulosis of large intestine without perforation or abscess without bleeding: Secondary | ICD-10-CM | POA: Diagnosis not present

## 2021-11-12 DIAGNOSIS — N200 Calculus of kidney: Secondary | ICD-10-CM | POA: Diagnosis not present

## 2021-11-12 DIAGNOSIS — K753 Granulomatous hepatitis, not elsewhere classified: Secondary | ICD-10-CM | POA: Diagnosis not present

## 2021-11-12 DIAGNOSIS — I700 Atherosclerosis of aorta: Secondary | ICD-10-CM | POA: Diagnosis not present

## 2021-11-15 ENCOUNTER — Ambulatory Visit: Payer: Medicare PPO

## 2021-11-15 ENCOUNTER — Ambulatory Visit: Payer: Medicare PPO | Admitting: Family Medicine

## 2021-11-15 ENCOUNTER — Encounter: Payer: Self-pay | Admitting: Family Medicine

## 2021-11-15 VITALS — BP 120/80 | HR 61 | Temp 97.7°F | Ht 71.0 in | Wt 238.6 lb

## 2021-11-15 DIAGNOSIS — E538 Deficiency of other specified B group vitamins: Secondary | ICD-10-CM | POA: Diagnosis not present

## 2021-11-15 DIAGNOSIS — M549 Dorsalgia, unspecified: Secondary | ICD-10-CM

## 2021-11-15 MED ORDER — TIZANIDINE HCL 4 MG PO TABS
4.0000 mg | ORAL_TABLET | Freq: Every evening | ORAL | 0 refills | Status: DC | PRN
Start: 2021-11-15 — End: 2022-02-28

## 2021-11-15 MED ORDER — DICLOFENAC SODIUM 75 MG PO TBEC: 75.0000 mg | DELAYED_RELEASE_TABLET | Freq: Two times a day (BID) | ORAL | 0 refills | Status: DC

## 2021-11-15 MED ORDER — CYANOCOBALAMIN 1000 MCG/ML IJ SOLN
1000.0000 ug | Freq: Once | INTRAMUSCULAR | Status: AC
  Administered 2021-11-15: 1000 ug via INTRAMUSCULAR

## 2021-11-15 NOTE — Progress Notes (Signed)
Richard Livingston [name], MD, [place] [facility] [place], [address]  Phone: [phone]  FAX: [phone]  Richard King - 81 y.o. male  MRN [id number]  Date of Birth: Oct 10, 1940  Date: 11/15/2021  PCP: Tonia Ghent, MD  Referral: Tonia Ghent, MD  Chief Complaint  Patient presents with   Back Pain    Middle of Back-Started last Saturday-Seen Urologist-R/O Kidney Stone   B12 Injection   Subjective:   Richard King is a 81 y.o. very pleasant male patient who presents with the following: Back Pain  ongoing for approximately: a few days The patient has had back pain before. The back pain is localized into the lumbar spine area. They also describe no radiculopathy.  Pleasant gentleman at age 81 who presents with some lower back pain that started last Saturday.  He also needs to get his B12 injection.  The notes from [facility] were reviewed and the patient did have a renal CT which did not show any evidence of obstruction by kidney stone.  He is here today in follow-up.  Felt pretty good yesterday, and it did not bothering him at all yesterday.  Carried some heavy weight, and then rode his lawnmower for an hour.  Sleeps in a recliner.    Had been sitting for about twenty minutes.  Was having a lot of pain when he went to bed.  Got up around one am, and he took a tramadol.  Could not really get rested at all.  Will hurt and burn and sting.  Right now, it is not feeling all that bad.  Was on the L side, but now it ha moved to the center of his back.   Has had some back problems off and on for many years.   No numbness or tingling. No bowel or bladder incontinence. No focal weakness. Prior interventions: none Physical therapy: No Chiropractic manipulations: No Acupuncture: No Osteopathic manipulation: No Heat or cold: Minimal effect   Review of Systems is noted in the HPI, as  appropriate  Objective:   Blood pressure 120/80, pulse 61, temperature 97.7 F (36.5 C), temperature source Oral, height '5\' 11"'$  (1.803 m), weight 238 lb 9 oz (108.2 kg), SpO2 96 %.  GEN: No acute distress; alert,appropriate. PULM: Breathing comfortably in no respiratory distress PSYCH: Normally interactive.   Range of motion at  the waist: Flexion, rotation and lateral bending: full  No echymosis or edema Rises to examination table with no difficulty Gait: minimally antalgic  Inspection/Deformity: No abnormality Paraspinus T:  mild pain l4-s1  B Ankle Dorsiflexion (L5,4): 5/5 B Great Toe Dorsiflexion (L5,4): 5/5NL Rise/Squat (L4): WNL, mild pain  SENSORY B Medial Foot (L4): WNL B Dorsum (L5): WNL B Lateral (S1): WNL Light Touch: WNL  B SLR, seated: neg B SLR, supine: neg B Greater Troch: NT B Log Roll: neg B Sciatic Notch: NT  Radiology: No results found.  Assessment and Plan:     ICD-10-CM   1. Acute back pain, unspecified back location, unspecified back pain laterality  M54.9     2. B12 deficiency  E53.8 cyanocobalamin ((VITAMIN B-12)) injection 1,000 mcg     Acute on chronic with exacerbation  Anatomy reviewed. Conservative algorithms for acute back pain generally begin with the following: NSAIDS, Muscle Relaxants, Mild pain medication Start with medications, core rehab, and progress from there following low back pain algorithm. No red flags are present.  Follow-up: No follow-ups on file.  Meds ordered this encounter  Medications   cyanocobalamin ((VITAMIN B-12)) injection 1,000 mcg   tiZANidine (ZANAFLEX) 4 MG tablet    Sig: Take 1 tablet (4 mg total) by mouth at bedtime as needed for muscle spasms.    Dispense:  30 tablet    Refill:  0   diclofenac (VOLTAREN) 75 MG EC tablet    Sig: Take 1 tablet (75 mg total) by mouth 2 (two) times daily.    Dispense:  60 tablet    Refill:  0   There are no discontinued medications. No orders of the defined  types were placed in this encounter.   Signed,  Maud Deed. Shreeya Recendiz, MD   Outpatient Encounter Medications as of 11/15/2021  Medication Sig   cyanocobalamin (,VITAMIN B-12,) 1000 MCG/ML injection 1022mg IM every 14 days   diclofenac (VOLTAREN) 75 MG EC tablet Take 1 tablet (75 mg total) by mouth 2 (two) times daily.   doxazosin (CARDURA) 4 MG tablet Take 1 tablet (4 mg total) by mouth daily.   hydrochlorothiazide (MICROZIDE) 12.5 MG capsule Take 1 capsule (12.5 mg total) by mouth daily.   losartan (COZAAR) 50 MG tablet Take 1 tablet (50 mg total) by mouth daily.   sertraline (ZOLOFT) 50 MG tablet Take 1 tablet (50 mg total) by mouth daily.   tiZANidine (ZANAFLEX) 4 MG tablet Take 1 tablet (4 mg total) by mouth at bedtime as needed for muscle spasms.   [EXPIRED] cyanocobalamin ((VITAMIN B-12)) injection 1,000 mcg    No facility-administered encounter medications on file as of 11/15/2021.

## 2021-11-28 DIAGNOSIS — Z9079 Acquired absence of other genital organ(s): Secondary | ICD-10-CM | POA: Diagnosis not present

## 2021-11-28 DIAGNOSIS — I10 Essential (primary) hypertension: Secondary | ICD-10-CM | POA: Diagnosis not present

## 2021-11-28 DIAGNOSIS — I440 Atrioventricular block, first degree: Secondary | ICD-10-CM | POA: Diagnosis not present

## 2021-11-28 DIAGNOSIS — E6609 Other obesity due to excess calories: Secondary | ICD-10-CM | POA: Diagnosis not present

## 2021-11-29 ENCOUNTER — Ambulatory Visit (INDEPENDENT_AMBULATORY_CARE_PROVIDER_SITE_OTHER): Payer: Medicare PPO

## 2021-11-29 DIAGNOSIS — E538 Deficiency of other specified B group vitamins: Secondary | ICD-10-CM | POA: Diagnosis not present

## 2021-11-29 MED ORDER — CYANOCOBALAMIN 1000 MCG/ML IJ SOLN
1000.0000 ug | Freq: Once | INTRAMUSCULAR | Status: AC
  Administered 2021-11-29: 1000 ug via INTRAMUSCULAR

## 2021-11-29 NOTE — Progress Notes (Signed)
Per orders of Dr. Duncan, injection of vit B12 given by Wilhelmine Krogstad. Patient tolerated injection well.  

## 2021-12-02 ENCOUNTER — Telehealth: Payer: Self-pay | Admitting: Family Medicine

## 2021-12-02 DIAGNOSIS — E538 Deficiency of other specified B group vitamins: Secondary | ICD-10-CM

## 2021-12-02 NOTE — Telephone Encounter (Signed)
Please call pt.  Would check B12 level at the next B12 shot appointment.  Needs lab done prior to dose being given.  Lab order is in.  Thanks.

## 2021-12-03 NOTE — Telephone Encounter (Signed)
Spoke with patient and made him aware he will have labs done before getting his injection. Patient verbalized understanding.

## 2021-12-11 DIAGNOSIS — D2239 Melanocytic nevi of other parts of face: Secondary | ICD-10-CM | POA: Diagnosis not present

## 2021-12-11 DIAGNOSIS — L720 Epidermal cyst: Secondary | ICD-10-CM | POA: Diagnosis not present

## 2021-12-13 ENCOUNTER — Other Ambulatory Visit (INDEPENDENT_AMBULATORY_CARE_PROVIDER_SITE_OTHER): Payer: Medicare PPO

## 2021-12-13 ENCOUNTER — Ambulatory Visit (INDEPENDENT_AMBULATORY_CARE_PROVIDER_SITE_OTHER): Payer: Medicare PPO

## 2021-12-13 DIAGNOSIS — E538 Deficiency of other specified B group vitamins: Secondary | ICD-10-CM

## 2021-12-13 LAB — VITAMIN B12: Vitamin B-12: 1172 pg/mL — ABNORMAL HIGH (ref 211–911)

## 2021-12-13 MED ORDER — CYANOCOBALAMIN 1000 MCG/ML IJ SOLN
1000.0000 ug | Freq: Once | INTRAMUSCULAR | Status: AC
  Administered 2021-12-13: 1000 ug via INTRAMUSCULAR

## 2021-12-13 NOTE — Progress Notes (Signed)
Patient presented for B 12 injection given by Jaymarie Yeakel, CMA to left deltoid, patient voiced no concerns nor showed any signs of distress during injection.  

## 2021-12-15 DIAGNOSIS — F419 Anxiety disorder, unspecified: Secondary | ICD-10-CM | POA: Diagnosis not present

## 2021-12-15 DIAGNOSIS — N529 Male erectile dysfunction, unspecified: Secondary | ICD-10-CM | POA: Diagnosis not present

## 2021-12-15 DIAGNOSIS — E669 Obesity, unspecified: Secondary | ICD-10-CM | POA: Diagnosis not present

## 2021-12-15 DIAGNOSIS — I10 Essential (primary) hypertension: Secondary | ICD-10-CM | POA: Diagnosis not present

## 2021-12-15 DIAGNOSIS — E785 Hyperlipidemia, unspecified: Secondary | ICD-10-CM | POA: Diagnosis not present

## 2021-12-15 DIAGNOSIS — R32 Unspecified urinary incontinence: Secondary | ICD-10-CM | POA: Diagnosis not present

## 2021-12-15 DIAGNOSIS — F3341 Major depressive disorder, recurrent, in partial remission: Secondary | ICD-10-CM | POA: Diagnosis not present

## 2021-12-15 DIAGNOSIS — N400 Enlarged prostate without lower urinary tract symptoms: Secondary | ICD-10-CM | POA: Diagnosis not present

## 2021-12-15 DIAGNOSIS — M1990 Unspecified osteoarthritis, unspecified site: Secondary | ICD-10-CM | POA: Diagnosis not present

## 2021-12-16 ENCOUNTER — Other Ambulatory Visit: Payer: Self-pay | Admitting: Family Medicine

## 2021-12-16 DIAGNOSIS — E538 Deficiency of other specified B group vitamins: Secondary | ICD-10-CM

## 2021-12-16 MED ORDER — CYANOCOBALAMIN 1000 MCG/ML IJ SOLN: INTRAMUSCULAR | Status: AC

## 2021-12-21 DIAGNOSIS — R351 Nocturia: Secondary | ICD-10-CM | POA: Diagnosis not present

## 2021-12-21 DIAGNOSIS — N401 Enlarged prostate with lower urinary tract symptoms: Secondary | ICD-10-CM | POA: Diagnosis not present

## 2021-12-21 DIAGNOSIS — N200 Calculus of kidney: Secondary | ICD-10-CM | POA: Diagnosis not present

## 2022-01-10 ENCOUNTER — Ambulatory Visit: Payer: Medicare PPO

## 2022-01-15 ENCOUNTER — Ambulatory Visit (INDEPENDENT_AMBULATORY_CARE_PROVIDER_SITE_OTHER): Payer: Medicare PPO

## 2022-01-15 DIAGNOSIS — E538 Deficiency of other specified B group vitamins: Secondary | ICD-10-CM | POA: Diagnosis not present

## 2022-01-15 MED ORDER — CYANOCOBALAMIN 1000 MCG/ML IJ SOLN
1000.0000 ug | Freq: Once | INTRAMUSCULAR | Status: AC
  Administered 2022-01-15: 1000 ug via INTRAMUSCULAR

## 2022-01-15 NOTE — Progress Notes (Signed)
Patient presented for B 12 injection given by Aisia Correira, CMA to right deltoid, patient voiced no concerns nor showed any signs of distress during injection.  

## 2022-01-18 ENCOUNTER — Other Ambulatory Visit: Payer: Self-pay

## 2022-01-18 NOTE — Patient Outreach (Signed)
[facility]) Care Management  01/18/2022  Richard King 30-Dec-1940 [id number]   Telephone Screen    Outreach call to patient to introduce [facility] services and assess care needs as part of benefit of PCP office and insurance plan. Spoke briefly with patient He was out shopping. Denies any RN CM needs or concerns at this time.    Main healthcare issue/concern today: Patient states that  urology office sent his medical procedure bill to [facility] but he has [facility]. He has gotten a bill saying that he owes. Patient states he has already called [facility] and urology office to get things straightened out but still waiting to hear back.   Health Maintenance/Care Gaps Addressed & Education Provided:   -Last AWV: 10/25/2019. Patient reports he knows PCP office has tried to contact about this as well. He will call and make an appt when he is back home and able to look at his appt calendar.       Enzo Montgomery, RN,BSN,CCM [place] Management Telephonic Care Management Coordinator Direct Phone: [phone] Toll Free: [phone] Fax: [phone]

## 2022-01-24 ENCOUNTER — Ambulatory Visit: Payer: Medicare PPO

## 2022-02-07 ENCOUNTER — Ambulatory Visit: Payer: Medicare PPO

## 2022-02-07 ENCOUNTER — Other Ambulatory Visit: Payer: Medicare PPO

## 2022-02-19 ENCOUNTER — Other Ambulatory Visit: Payer: Self-pay

## 2022-02-19 ENCOUNTER — Ambulatory Visit (INDEPENDENT_AMBULATORY_CARE_PROVIDER_SITE_OTHER): Payer: Medicare PPO | Admitting: *Deleted

## 2022-02-19 ENCOUNTER — Ambulatory Visit: Payer: Medicare PPO | Admitting: Family Medicine

## 2022-02-19 ENCOUNTER — Ambulatory Visit: Payer: Medicare PPO

## 2022-02-19 ENCOUNTER — Other Ambulatory Visit (INDEPENDENT_AMBULATORY_CARE_PROVIDER_SITE_OTHER): Payer: Medicare PPO

## 2022-02-19 DIAGNOSIS — E538 Deficiency of other specified B group vitamins: Secondary | ICD-10-CM

## 2022-02-19 LAB — VITAMIN B12: Vitamin B-12: 555 pg/mL (ref 211–911)

## 2022-02-19 MED ORDER — CYANOCOBALAMIN 1000 MCG/ML IJ SOLN
1000.0000 ug | Freq: Once | INTRAMUSCULAR | Status: AC
  Administered 2022-02-19: 1000 ug via INTRAMUSCULAR

## 2022-02-19 NOTE — Progress Notes (Signed)
Per orders of Dr. Danise Mina, injection of B12 given by Tammi Sou. Patient tolerated injection well.   PCP out of the office today

## 2022-02-21 ENCOUNTER — Ambulatory Visit: Payer: Medicare PPO | Admitting: Family Medicine

## 2022-02-21 ENCOUNTER — Ambulatory Visit: Payer: Medicare PPO

## 2022-02-28 ENCOUNTER — Ambulatory Visit (INDEPENDENT_AMBULATORY_CARE_PROVIDER_SITE_OTHER): Payer: Medicare PPO | Admitting: Family Medicine

## 2022-02-28 ENCOUNTER — Encounter: Payer: Self-pay | Admitting: Family Medicine

## 2022-02-28 VITALS — BP 110/60 | HR 69 | Temp 97.7°F | Ht 71.0 in | Wt 241.0 lb

## 2022-02-28 DIAGNOSIS — Z23 Encounter for immunization: Secondary | ICD-10-CM | POA: Diagnosis not present

## 2022-02-28 DIAGNOSIS — I10 Essential (primary) hypertension: Secondary | ICD-10-CM | POA: Diagnosis not present

## 2022-02-28 DIAGNOSIS — R252 Cramp and spasm: Secondary | ICD-10-CM

## 2022-02-28 DIAGNOSIS — E538 Deficiency of other specified B group vitamins: Secondary | ICD-10-CM | POA: Diagnosis not present

## 2022-02-28 DIAGNOSIS — R4586 Emotional lability: Secondary | ICD-10-CM

## 2022-02-28 MED ORDER — SERTRALINE HCL 50 MG PO TABS
50.0000 mg | ORAL_TABLET | Freq: Every day | ORAL | 3 refills | Status: DC
Start: 2022-02-28 — End: 2023-01-09

## 2022-02-28 MED ORDER — LOSARTAN POTASSIUM 50 MG PO TABS: 50.0000 mg | ORAL_TABLET | Freq: Every day | ORAL | 3 refills | Status: DC

## 2022-02-28 MED ORDER — DOXAZOSIN MESYLATE 4 MG PO TABS: 4.0000 mg | ORAL_TABLET | Freq: Every day | ORAL | 3 refills | Status: DC

## 2022-02-28 MED ORDER — HYDROCHLOROTHIAZIDE 12.5 MG PO CAPS: 12.5000 mg | ORAL_CAPSULE | Freq: Every day | ORAL | 3 refills | Status: DC

## 2022-02-28 NOTE — Patient Instructions (Addendum)
If lightheaded, then cut the losartan in half and check your BP.   Keep drinking enough water to keep your urine clear or light colored.   Recheck in about 6 months.  We can do labs at the visit.

## 2022-02-28 NOTE — Progress Notes (Signed)
B12 def on replacement.  B12 wnl.  D/w pt.   Hypertension:    Using medication without problems or lightheadedness: h/o vertigo with separate issue with orthostatic sx for a few seconds, not daily, ie rarely, cautions d/w pt.   Chest pain with exertion:no Edema:no Short of breath:at baseline over the lat few years, not changed.    Still on sertraline. Mood is good.  Compliant.  D/w pt about continued med.    He has tensing of the muscle in R>L leg, at night, taking vinegar and mustard at night, along with eating a banana.  That reportedly helps.  Mother had similar, per patient report.  Tonic water helped but he had diarrhea.  No sx with activity.    Meds, vitals, and allergies reviewed.   ROS: Per HPI unless specifically indicated in ROS section .  GEN: nad, alert and oriented HEENT: ncat NECK: supple w/o LA CV: rrr PULM: ctab, no inc wob ABD: soft, +bs EXT: no edema SKIN: Well-perfused.

## 2022-03-01 DIAGNOSIS — R4586 Emotional lability: Secondary | ICD-10-CM | POA: Insufficient documentation

## 2022-03-01 NOTE — Assessment & Plan Note (Signed)
Continue B12 replacement as is. 

## 2022-03-01 NOTE — Assessment & Plan Note (Signed)
Current Outpatient Medications on File Prior to Visit  Medication Sig Dispense Refill  . cyanocobalamin (,VITAMIN B-12,) 1000 MCG/ML injection 1055mg IM every 30 days 1 mL    No current facility-administered medications on file prior to visit.   No change in medications at this point. If lightheaded, then cut the losartan in half and check BP.   I asked him to keep drinking enough water to keep his urine clear or light colored.   Recheck in about 6 months.  We can do labs at the visit.

## 2022-03-01 NOTE — Assessment & Plan Note (Signed)
Mood improved on sertraline.  Would continue as is.  Discussed.  He agrees.  Okay for outpatient follow-up.

## 2022-03-01 NOTE — Assessment & Plan Note (Signed)
Discussed staying well-hydrated.  A combination of vinegar mustard and a banana helps at night.  He will update me as needed.  He does not have exertional symptoms.

## 2022-03-07 ENCOUNTER — Ambulatory Visit: Payer: Medicare PPO

## 2022-03-17 ENCOUNTER — Telehealth: Payer: Self-pay | Admitting: Family Medicine

## 2022-03-17 DIAGNOSIS — I779 Disorder of arteries and arterioles, unspecified: Secondary | ICD-10-CM

## 2022-03-17 NOTE — Telephone Encounter (Signed)
Please call patient.  Due for follow-up echo to recheck his aorta, which was noted to be enlarged on previous echo.  I have the order pended below.  Let me know if he consents and I will sign the order.  Thanks.

## 2022-03-18 NOTE — Telephone Encounter (Signed)
Patient is fine with getting echo done

## 2022-03-19 NOTE — Telephone Encounter (Signed)
Order signed. Thanks.

## 2022-03-21 ENCOUNTER — Ambulatory Visit (INDEPENDENT_AMBULATORY_CARE_PROVIDER_SITE_OTHER): Payer: Medicare PPO

## 2022-03-21 DIAGNOSIS — E538 Deficiency of other specified B group vitamins: Secondary | ICD-10-CM

## 2022-03-21 MED ORDER — CYANOCOBALAMIN 1000 MCG/ML IJ SOLN
1000.0000 ug | Freq: Once | INTRAMUSCULAR | Status: AC
  Administered 2022-03-21: 1000 ug via INTRAMUSCULAR

## 2022-03-21 NOTE — Progress Notes (Signed)
Per orders of Dr. Graham Duncan, injection of Vitamin B 12 given in right deltoid given by Conal Shetley. Patient tolerated injection well.  

## 2022-04-02 DIAGNOSIS — M25561 Pain in right knee: Secondary | ICD-10-CM | POA: Diagnosis not present

## 2022-04-03 ENCOUNTER — Ambulatory Visit: Payer: Medicare PPO

## 2022-04-04 ENCOUNTER — Ambulatory Visit
Admission: RE | Admit: 2022-04-04 | Discharge: 2022-04-04 | Disposition: A | Discharge status: Home / Self Care | Payer: Medicare PPO | Source: Ambulatory Visit | Attending: Family Medicine | Admitting: Family Medicine

## 2022-04-04 DIAGNOSIS — R008 Other abnormalities of heart beat: Secondary | ICD-10-CM | POA: Diagnosis not present

## 2022-04-04 DIAGNOSIS — I779 Disorder of arteries and arterioles, unspecified: Secondary | ICD-10-CM | POA: Diagnosis not present

## 2022-04-04 DIAGNOSIS — I080 Rheumatic disorders of both mitral and aortic valves: Secondary | ICD-10-CM | POA: Insufficient documentation

## 2022-04-04 DIAGNOSIS — I77819 Aortic ectasia, unspecified site: Secondary | ICD-10-CM | POA: Insufficient documentation

## 2022-04-04 DIAGNOSIS — I10 Essential (primary) hypertension: Secondary | ICD-10-CM | POA: Diagnosis not present

## 2022-04-04 HISTORY — PX: DOPPLER ECHOCARDIOGRAPHY: SHX263

## 2022-04-04 LAB — ECHOCARDIOGRAM COMPLETE
AR max vel: 2.96 cm2
AV Area VTI: 3.47 cm2
AV Area mean vel: 3.17 cm2
AV Mean grad: 6 mmHg
AV Peak grad: 10.6 mmHg
Ao pk vel: 1.63 m/s
Area-P 1/2: 2.85 cm2
Calc EF: 56.6 %
P 1/2 time: 1171 ms
S' Lateral: 3.7 cm
Single Plane A2C EF: 55.3 %
Single Plane A4C EF: 56.2 %

## 2022-04-04 NOTE — Progress Notes (Signed)
*  PRELIMINARY RESULTS* Echocardiogram 2D Echocardiogram has been performed.  Richard King 04/04/2022, 11:09 AM

## 2022-04-09 ENCOUNTER — Ambulatory Visit: Payer: Medicare PPO | Admitting: Family Medicine

## 2022-04-09 ENCOUNTER — Encounter: Payer: Self-pay | Admitting: Family Medicine

## 2022-04-09 VITALS — BP 122/68 | HR 83 | Temp 97.8°F | Ht 71.0 in | Wt 241.0 lb

## 2022-04-09 DIAGNOSIS — I77810 Thoracic aortic ectasia: Secondary | ICD-10-CM | POA: Diagnosis not present

## 2022-04-09 DIAGNOSIS — I10 Essential (primary) hypertension: Secondary | ICD-10-CM | POA: Diagnosis not present

## 2022-04-09 MED ORDER — LOSARTAN POTASSIUM 50 MG PO TABS: 25.0000 mg | ORAL_TABLET | Freq: Every day | ORAL | Status: DC

## 2022-04-09 MED ORDER — DOXAZOSIN MESYLATE 4 MG PO TABS: 4.0000 mg | ORAL_TABLET | Freq: Every day | ORAL | 3 refills | Status: DC

## 2022-04-09 NOTE — Progress Notes (Unsigned)
D/w pt about echo.  There is moderate dilatation of the aortic root, measuring 45 mm. Consider recheck in 1 year.  D/w pt about LVH and treating BP as is.    He is less lightheaded on lower dose of losartan, ie better at '25mg'$ .  BP generally controlled as is.  Home readings have been usually in the 295M-841L systolic, occasionally down below 110.  None below 100.  Diastolic usually is in the 60s to 70s.  No chest pain.  Current Outpatient Medications on File Prior to Visit  Medication Sig Dispense Refill   cyanocobalamin (,VITAMIN B-12,) 1000 MCG/ML injection 1064mg IM every 30 days 1 mL    hydrochlorothiazide (MICROZIDE) 12.5 MG capsule Take 1 capsule (12.5 mg total) by mouth daily. 90 capsule 3   sertraline (ZOLOFT) 50 MG tablet Take 1 tablet (50 mg total) by mouth daily. 90 tablet 3   No current facility-administered medications on file prior to visit.   Meds, vitals, and allergies reviewed.   ROS: Per HPI unless specifically indicated in ROS section   GEN: nad, alert and oriented HEENT: ncat NECK: supple w/o LA CV: rrr. PULM: ctab, no inc wob ABD: soft, +bs EXT: no edema SKIN: no acute rash

## 2022-04-09 NOTE — Patient Instructions (Signed)
You have minimal changes on your echo, compared to last year.   Recheck echo in about 1 year.  If lightheaded, let me know.   Otherwise, keep going as is.   Take care.  Glad to see you.

## 2022-04-10 DIAGNOSIS — I77810 Thoracic aortic ectasia: Secondary | ICD-10-CM | POA: Insufficient documentation

## 2022-04-10 NOTE — Assessment & Plan Note (Signed)
Continue hydrochlorothiazide doxazosin and losartan as is.

## 2022-04-10 NOTE — Assessment & Plan Note (Signed)
There is moderate dilatation of the aortic root, measuring 45 mm. Consider recheck in 1 year with echo.  D/w pt about LVH and treating BP as is.  Does not need cardiothoracic referral at this point.  Discussed.

## 2022-04-23 ENCOUNTER — Ambulatory Visit (INDEPENDENT_AMBULATORY_CARE_PROVIDER_SITE_OTHER): Payer: Medicare PPO | Admitting: *Deleted

## 2022-04-23 DIAGNOSIS — E538 Deficiency of other specified B group vitamins: Secondary | ICD-10-CM | POA: Diagnosis not present

## 2022-04-23 MED ORDER — CYANOCOBALAMIN 1000 MCG/ML IJ SOLN
1000.0000 ug | Freq: Once | INTRAMUSCULAR | Status: AC
  Administered 2022-04-23: 1000 ug via INTRAMUSCULAR

## 2022-04-23 NOTE — Progress Notes (Signed)
Per orders of Dr. Damita Dunnings, injection of Vitamin B 12 given by Emelia Salisbury. Patient tolerated injection well.

## 2022-04-29 DIAGNOSIS — H353121 Nonexudative age-related macular degeneration, left eye, early dry stage: Secondary | ICD-10-CM | POA: Diagnosis not present

## 2022-04-29 DIAGNOSIS — H43813 Vitreous degeneration, bilateral: Secondary | ICD-10-CM | POA: Diagnosis not present

## 2022-04-29 DIAGNOSIS — H52203 Unspecified astigmatism, bilateral: Secondary | ICD-10-CM | POA: Diagnosis not present

## 2022-05-01 DIAGNOSIS — R310 Gross hematuria: Secondary | ICD-10-CM | POA: Diagnosis not present

## 2022-05-08 ENCOUNTER — Other Ambulatory Visit: Payer: Self-pay | Admitting: Urology

## 2022-05-08 DIAGNOSIS — Z8551 Personal history of malignant neoplasm of bladder: Secondary | ICD-10-CM | POA: Diagnosis not present

## 2022-05-08 DIAGNOSIS — R310 Gross hematuria: Secondary | ICD-10-CM | POA: Diagnosis not present

## 2022-05-08 IMAGING — DX DG SHOULDER 1V*L*
1 series · 1 of 1 positions shown · non-contrast
Comparison: Preoperative shoulder CT and radiograph.

CLINICAL DATA: Status post shoulder surgery.

EXAM:
LEFT SHOULDER

[shoulder]
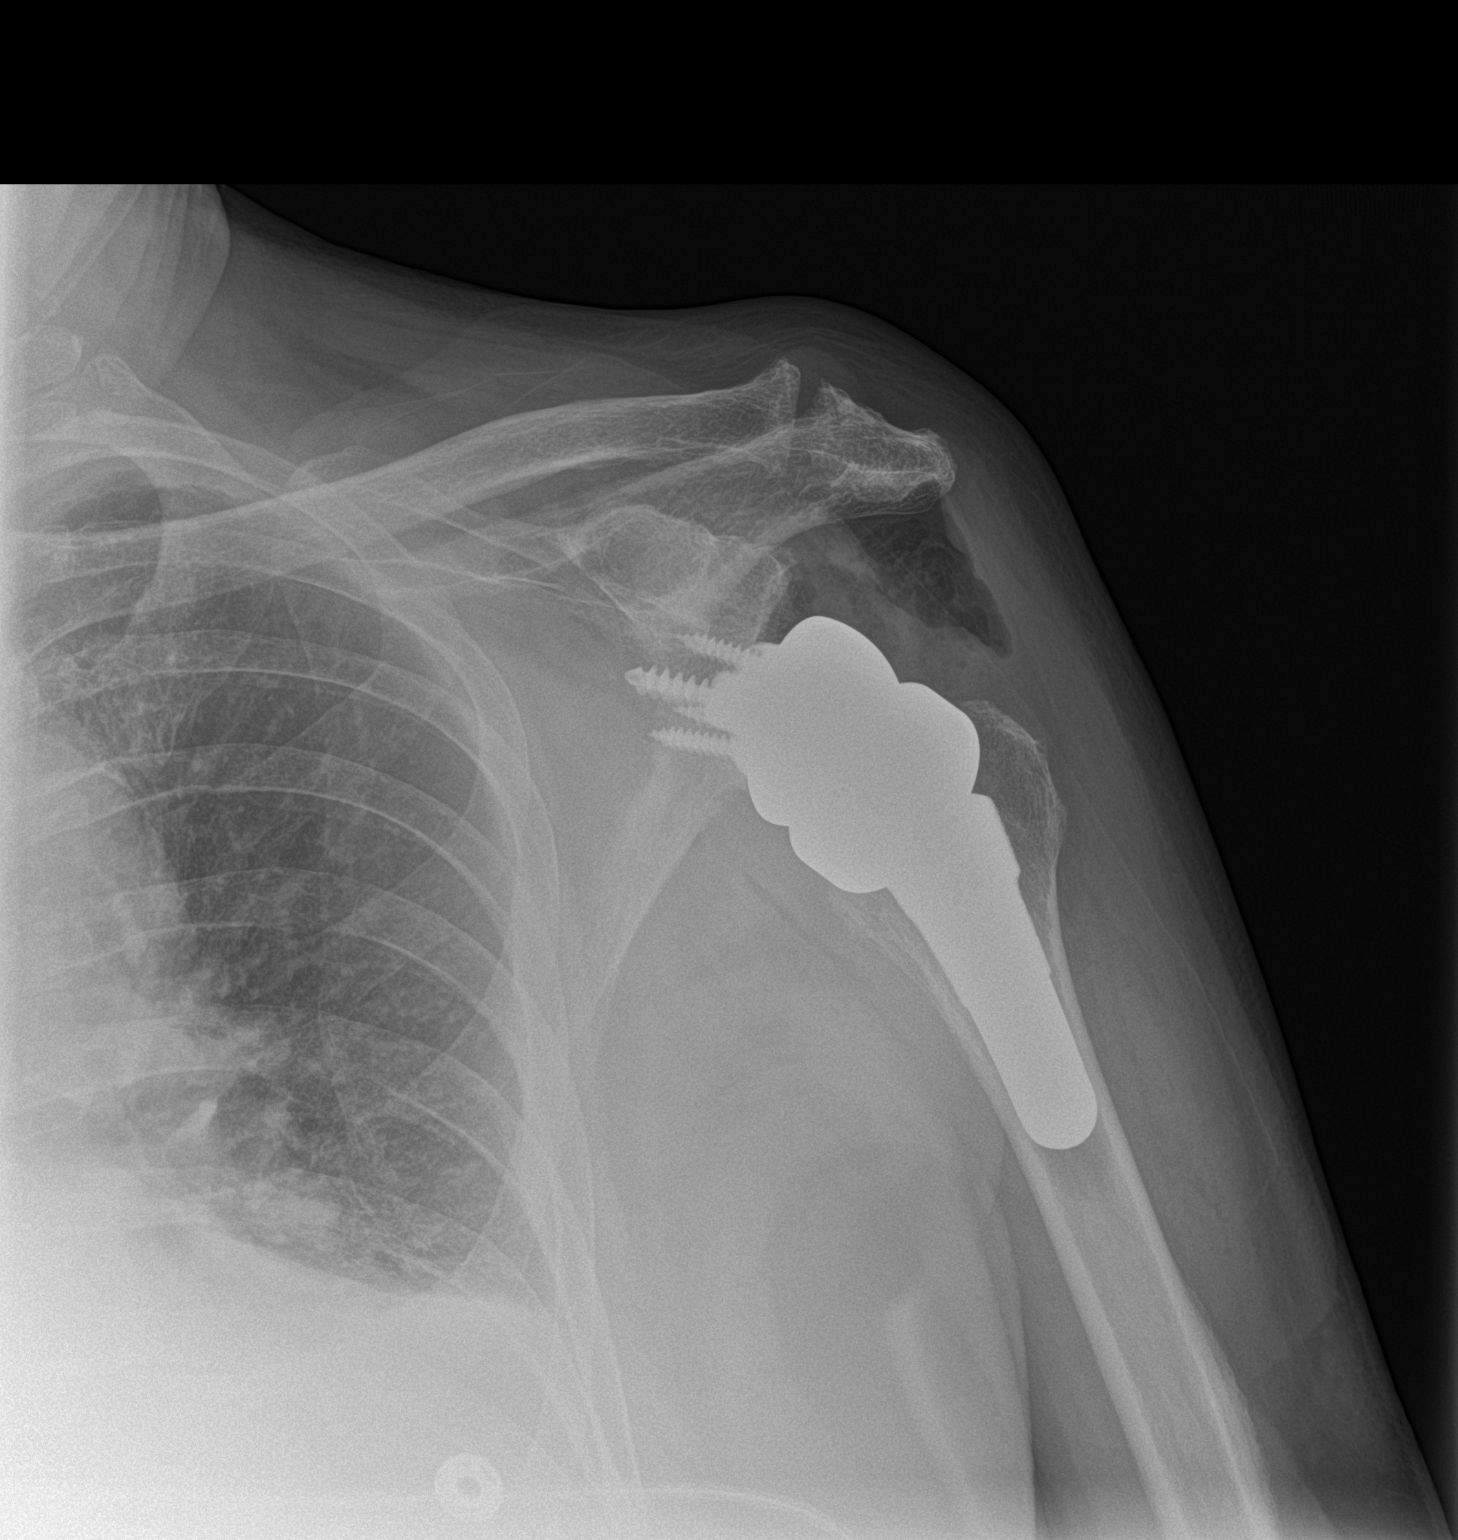

[1 of 1 positions shown; findings below may reference images not displayed]

FINDINGS: Reverse left shoulder arthroplasty. Glenoid and humeral components
are congruent. Glenoid component appears inferiorly situated,
possibly related to positioning. Recent postsurgical change includes
air and edema in the joint space.
IMPRESSION: 1. Recent reverse left shoulder arthroplasty.
2. Glenoid component appears inferiorly situated, possibly related
to positioning.

## 2022-05-17 ENCOUNTER — Encounter (HOSPITAL_BASED_OUTPATIENT_CLINIC_OR_DEPARTMENT_OTHER): Payer: Self-pay | Admitting: Urology

## 2022-05-20 ENCOUNTER — Other Ambulatory Visit: Payer: Self-pay

## 2022-05-20 ENCOUNTER — Encounter (HOSPITAL_BASED_OUTPATIENT_CLINIC_OR_DEPARTMENT_OTHER): Payer: Self-pay | Admitting: Urology

## 2022-05-20 NOTE — Progress Notes (Signed)
Spoke w/ via phone for pre-op interview---[facility] Lab needs dos----ISTAT, EKG               Lab results------04/04/2022 echocardiogram in Epic COVID test -----patient states asymptomatic no test needed Arrive at -------1100 on Tuesday, 05/21/2022. NPO after MN NO Solid Food.  Clear liquids from MN until---1000 Med rec completed Medications to take morning of surgery -----none Diabetic medication -----n/a Patient instructed no nail polish to be worn day of surgery Patient instructed to bring photo id and insurance card day of surgery Patient aware to have Driver (ride ) / caregiver    for 24 hours after surgery - daughter, Tammy  Patient Special Instructions -----none Pre-Op special Istructions -----none Patient verbalized understanding of instructions that were given at this phone interview. Patient denies shortness of breath, chest pain, fever, cough at this phone interview.

## 2022-05-20 NOTE — H&P (Signed)
ButHPI: Richard King is a 81 year-old male established patient who is here for urinary retention.   01/12/2020: Patient with past history of the obstructive voiding symptoms on tamsulosin twice daily, and bladder cancer with most recent surveillance cystoscopy last fall indicating no evidence of disease.   He recently underwent left shoulder arthroscopy and developed postoperative urinary retention likely precipitated by constipation which has subsequently resolved. Presents today for possible trial of void.   Patient reports no problems with catheter urgency. He states this is been more of a nuisance for him but tolerating it well otherwise. Denies significant painful urgency or leaking around Catheter tubing, gross hematuria. Denies interval fevers/chills, nausea/vomiting. His constipation has resolved.   01/20/20: Patient with above-noted history. Unfortunately, he failed voiding trial at prior office visit return to clinic in the afternoon with PVR greater than 700. Indwelling catheter was replaced. He returns today for repeat voiding trial. He states his catheter has been draining appropriately. No complaints of gross hematuria or fevers. States he has been tolerating it, though is had some irritation at his urethral meatus. No complaints of painful urgency or leakage from around his catheter.   01/21/20: Patient underwent successful voiding trial yesterday in clinic. However, he presents today with complaints of small volume, frequent voids. He states he is voiding a small amount about every 30-45 minutes. He continues to feel his stream is weak. He is starting to have suprapubic pain and pressure, which has progressed over the evening hours. He denies dysuria or gross hematuria. No complaints of fevers or chills. He remains on tamsulosin 0.8 mg q.h.s..   His current symptoms did begin after he had a surgical procedure. The surgery he had done was left should arthroscopy. His urinary retention is  being treated with foley catheter and flomax.   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 He has been on tamsulosin 0.8 mg q.h.s. but had failed voiding trial on 01/21/2020. Patient was seen by nurse practitioner and scheduled for urodynamics. He is seen today because the catheter has caused irritation to his penis. The patient has not had urodynamics. Patient has also had history of bladder cancer in the remote past and needs follow-up surveillance as well.  -02/25/20-the patient with history of recent urinary retention as above. Underwent urodynamics which was reviewed today. This showed maximum detrusor pressure of 60 cm water with flow rate of 4 mL/second suggestive of obstructive voiding pattern. The patient was able to empty his bladder time of urodynamics but Foley was replaced as it was done at the end of the day before the weekend with fear that he might go into retention over the weekend. He is here now for follow-up cysto.    Cysto 2 performed today and shows normal pendulous urethra. Prostatic urethra shows bilobar prostatic hypertrophy with prostatic urethral length of about 3 cm. Bladder appeared grossly normal except for likely inflammatory response from indwelling Foley in the base the bladder. Also trabeculations noted. Patient was able to successfully void out the 200 cc that was instilled during his cystoscopy.   PROCEDURES:   Urodynamics - 51600, [id number], [id number], [id number], [id number]  URODYNAMICS STUDY   Test Indication: Retention  The procedure's risks, benefits and infection risk were discussed with the patient.   PRE UROFLOW & CATHETERIZATION  Procedure: Pre Uroflow Study  The patient did not void. Arrived with a 16 fr foley.  His catheter was removed, and a Urodynamic catheter was inserted.  CYSTOMETRY/CYSTOMETROGRAM  The bladder was filled with room temperature water at a rate of less than 50 cc per minute.   Injection of contrast was performed for the cystometrogram.  Max capacity was approx. 125 mls. First sensation occurred at 45 mls. Normal desire occurred at 47 mls. Strong desire occurred at 49 mls.   The bladder was unstable.  First unstable contraction occurred at 43 mls.  Max unstable detrusor contraction was 65 cmH20.  He leaked some and then voided with urgency off this unstable contraction. Filling was continued.   LEAK POINT PRESSURE  LPPs were assessed with pt in a seated position. Visualization was used to assess for SUI.  No leakage was noted with abdominal pressures of 100-108 cmH20.   PRESSURE FLOW STUDY  He was able to generate a voluntary contraction and void. His voiding pressures were high, along with an obstructed flow pattern.   Volume voided approx. 99 mls. Max flow was 4 ml/s.  Detrusor pressure at peak flow was 34 cmH20.  Max detrusor pressure was 60 cmH20.  PVR was approx. 23 mls.   ELECTROMYOGRAM  Activity was measured by surface electrodes  Intermittent increase in EMG leads was noted during voiding.   FLUOROSCOPY and VCUG  No X-ray - Pt has an Iodine allergy.   POST PROCEDURE ANTIBIOTICS:  No antibiotics were given post UDS. Urine was sent for culture. He was advised to watch for s/s of a break through UTI and to call our office or go to the ED if he develops any of them.   UDS SUMMARY  [name] held a max capacity of approx. 125 mls. His 1st sensation was felt at 45 mls. There was positive instability, and his first void was with urgency off an unstable contraction. Filling was continued, and he was able to generate a voluntary contraction and void. His voiding pressures were high, along with an obstructed flow pattern. Intermittent increase in EMG activity was noted during voiding. PVR was approx. 23 mls. Because it is late in the day on a Friday, and the fact that he has had failed voiding trials in the past, I inserted a 16 fr foley after the study. He  will return next week for UDS f/u, sooner if needed.   03/02/2020: Follows up today for repeat exam. Successful trial of void at time of last office visit. He continues tamsulosin and finasteride as directed. Currently taking 0.8 mg tamsulosin at night and tolerating well. Over the past week he continues if we can intermittent stream, some mild hesitancy and straining as well. No longer having any burning or painful urination. He denies any interval blood in the urine. Nocturia has been variable. A few nights he did not get up at all to void but last night he got up 3-4 times but attributes this to drinking a lot of water prior to bedtime. No interval fevers or chills, nausea/vomiting. He expresses desire to continue finasteride to see if it has any notable affect on baseline voiding symptoms but is also interested in definitive outlet reduction procedure specifically mentioning TURP and Urolift.  : Acceptable bladder residual today. Urinalysis significantly improved from time of last assessment. No indication for further testing for underlying infectious process at this time especially with absence of painful bladder spasms, burning painful urination, visible blood in the urine. Nocturia has been variable over the past week but a lot of this is dependent on how much fluid he drinks prior to bedtime. We discussed fluid modification  today. He is going to continue tamsulosin 0.8 mg q.h.s. as well as daily finasteride. Patient would like to see if symptoms improve with continuing finasteride and understands it does take time for the medication to take any notable affect. I will refill tamsulosin today, he is tolerating 0.8 mg well without any noted side effect. Patient would like to discuss his candidacy for definitive outlet reduction procedure as well. I will have him back in 1 month for repeat exam per patient request Dr. Milford Cage. Appropriate return to clinic instructions discussed with worsening lower urinary  tract symptoms including frequency/urgency, painful inability to void, burning or painful urination, visible blood in the urine or correlating signs/symptoms of systemic infection.  -04/05/20-patient with history of BPH and remote history of bladder cancer(last documented recurrence in 2012) with negative recent cystoscopy in September of 2021. Has been on Doxazosin daily and finasteride was initiated in August of 2021. The patient had also had been on tamsulosin in the past but made him dizzy and PCP switched him to the doxazosin. He is now here for follow-up. In the interim the patient states that voiding has improved significantly since being on the finasteride. States that the urinary stream is much stronger. He continues to have nocturia but I think this is fluid mobilization as he has significant lower extremity edema.  Micro urinalysis shows greater than 60 WBCs with moderate bacteria and positive nitrites and is sent for culture today. Postvoid residual is 57 cc today  Urine is sent for culture today, initiated Keflex 500 mg t.i.d. as prescription management, disposition accordingly regarding antibiotic sensitivity. Recommended continuing on finasteride 5 mg daily and doxazosin that was prescribed by his PCP. He apparently had issues taking tamsulosin in the past. Will see back here in 6 months for follow-up PVR sooner if he has urinary issues. Will need annual cystoscopy for follow-up of his bladder cancer which will be in September of 2022. For   -10/02/20-patient with history of BPH managed with finasteride and doxazosin. 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  Micro urinalysis shows 40 60 WBCs and 320 RBCs and many bacteria this was sent for culture  Postvoid residual equals: 43 cc  -01/01/21-patient with history of BPH currently on silodosin 8 mg daily and dutasteride 0.5 mg daily. He has noted reasonable urinary stream  in terms of flow rate. He still continues to have problems with gynecomastia due to the dutasteride.  Micro urinalysis shows 20-40 WBCs 3-10 RBCs and many bacteria will be sent for culture  Postvoid residual equals: 20cc  Urine was sent for C&S, initiated Keflex 500 mg t.i.d. until C&S results available. I am going to see him back in a couple weeks finishing his antibiotics for cysto to assess prostate and bladder. Will likely need definitive procedure with cysto and TURP as he is intolerant of the 5 alpha reductase inhibitor due to gynecomastia.   -01/19/21-patient with history of BPH currently on silodosin and dutasteride. Has had some issues with gynecomastia being on the dutasteride. Is interested in definitive management for his outlet symptoms. Patient also with history remote history of bladder cancer which was originally diagnosed in July of 2011. Was stage T1 grade 3 at that time. 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 Patient also received maintenance therapy in February 2014. Has had no recurrence since  that time. Is due for surveillance cysto. Here for cysto to assess bladder/prostate.  Cysto he is performed today and shows: Mainly bilobar prostatic hypertrophy with about 3.5 cm prostatic urethral length. Bladder was inspected revealed 1 mild erythematous area in the base of the bladder which was non raised. The I feel like was low risk for recurrence.   02/12/2021: Pt underwent TURP on 09/08. In the immediate postoperative. He developed 3rd degree heart block with some hypotension. He was admitted overnight on telemetry. He he had returned to normal sinus rhythm prior to discharge on 9/7. His verapamil was discontinued in favor of hydralazine taken twice daily at cardiology recommendation. Outpatient Cardiology follow-up was arranged. Now back today for repeat  assessment and trial of void. Given history of bladder cancer, a biopsy of a suspicious lesion in the bladder was obtained during procedure which fortunately resulted benign for any type of malignancy. Pathology from submitted prostate tissue was also benign.   Catheter has been draining well. Denies painful urgency or leaking around Catheter tubing. He has not had gross hematuria. No postoperative fevers or chills, nausea/vomiting.  -03/16/21-patient with history of BPH status post recent TURP on 02/08/2021. Also had bladder biopsy which was benign had remote history of bladder cancer. Pathology from the TURP was also benign. Since removal of his Foley catheter on 02/12/2021 patient states he has been voiding satisfactorily. States the stream has slowed down somewhat though seems to be voiding okay. Urine occasionally cloudy but no dysuria or hematuria.  Micro urinalysis showed 20-40 WBCs few bacteria was sent for culture.  Post void residual 72 cc  -06/08/21-patient with history of BPH status post TURP 02/08/2021. Here now for follow-up. In the interim, the patient has developed some swelling in his feet and lower extremities. He has not had significant edema before. Seems to be voiding satisfactorily according to his report since his TURP.  Post void residual is 57 cc  Micro urinalysis rare bacteria otherwise clear   -12/21/21-patient with history of BPH status post TURP 02/08/2021. He was recently seen by Dr.Machen on call for some right-sided flank/abdominal pain. He had a CT urogram confirming some nonobstructing fairly large right renal calculi without evidence of ureteral obstruction. Stones not felt to be the cause for his pain. See CT report below he was deferred to his PCP. He is now here for follow-up. In the interim the patient has become pain-free. He thinks he may have pulled a muscle in his back.  Micro urinalysis is clear on urine spun sediment  Postvoid residual equals:  CLINICAL DATA:  81 year old male with history of flank pain.   EXAM:  CT ABDOMEN AND PELVIS WITHOUT CONTRAST   TECHNIQUE:  Multidetector CT imaging of the abdomen and pelvis was performed  following the standard protocol without IV contrast.   RADIATION DOSE REDUCTION: This exam was performed according to the  departmental dose-optimization program which includes automated  exposure control, adjustment of the mA and/or kV according to  patient size and/or use of iterative reconstruction technique.   COMPARISON: CT the abdomen and pelvis 03/23/2019.   FINDINGS:  Lower chest: Scarring in the lung bases.   Hepatobiliary: No definite suspicious cystic or solid hepatic  lesions are confidently identified on today's noncontrast CT  examination. Tiny calcified granuloma in the right lobe of the liver  incidentally noted. Unenhanced appearance of the gallbladder is  unremarkable.   Pancreas: No pancreatic mass or peripancreatic fluid collections or  inflammatory changes noted on today's  noncontrast CT examination.   Spleen: Unremarkable.   Adrenals/Urinary Tract: Multiple nonobstructive calculi are noted  within the collecting systems of both kidneys  (right-greater-than-left), largest of which is in the lower pole of  the right kidney measuring up to 19 x 11 x 10 mm. No additional  calculi are noted along the course of either ureter or within the  lumen of the urinary bladder. No hydroureteronephrosis. Multiple  low-attenuation lesions are again noted in both kidneys, similar in  number and size to the prior examination (incompletely characterized  on today's noncontrast CT examination, but statistically likely to  represent cysts; no imaging follow-up recommended), largest of which  is exophytic in the posterior aspect of the interpolar region of the  right kidney measuring up to 5.5 cm in diameter.   Stomach/Bowel: Unenhanced appearance of the stomach is normal. No  pathologic dilatation of  small bowel or colon. Numerous colonic  diverticulae are noted, particularly in the descending colon and  sigmoid colon, without surrounding inflammatory changes to suggest  an acute diverticulitis at this time. Normal appendix.   Vascular/Lymphatic: Aortic atherosclerosis. No lymphadenopathy noted  in the abdomen or pelvis.   Reproductive: Prostate gland and seminal vesicles are unremarkable  in appearance.   Other: No significant volume of ascites. No pneumoperitoneum.   Musculoskeletal: There are no aggressive appearing lytic or blastic  lesions noted in the visualized portions of the skeleton.   IMPRESSION:  1. 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 No ureteral  stones or findings of urinary tract obstruction are noted at this  time.  2. Colonic diverticulosis without evidence of acute diverticulitis  at this time.  3. Aortic atherosclerosis.  4. Additional incidental findings, similar to prior examinations, as  above.    Electronically Signed  By: Vinnie Langton M.D.  On: 11/14/2021 07:17   -05/01/22-patient with history of BPH status post TURP in September 22. Also has known right renal calculus diagnosed with CT scan back in June 2023 as above. He presents today with some mild suprapubic discomfort which has been going on over the last day or 2. This is not occurring today. He also has seen a little bit of blood in his urine. Urine is clear today.  Micro urinalysis clear on urine spun sediment  -05/08/22-patient with history of BPH status post TURP in September 22. Also known right renal calculus noted on CT scan in June 2023. Recently had some suprapubic discomfort and some hematuria is now here for cystoscopy to assess bladder. Review of his chart shows history of previous high-grade urothelial carcinoma which was found  unexpectedly at time of ureteroscopy in July 2011 at his original resection. Pathology found to be T1 disease and he is undergone BCG induction therapy x2 last 1 in August 2012. He has had no recurrence since that time.  Micro urinalysis is clear on urine spun sediment  Cystoscopy is performed today and shows: Small pendulous urethral stricture which I was able to pass with the scope without difficulty. Has bilobar prostatic hypertrophy with TUR defect. Bladder was inspected shows trabeculated bladder numerous cellules and several prior TURBT sites within the urinary bladder which were smooth and devoid of tumor. On the left floor of the bladder adjacent to prior TURBT site there is a slightly raised erythematous area measuring probably 2 cm in  size which is suspicious for recurrent carcinoma. Ureteral orifice ease were well away from these areas and effluxing clear urine.     ALLERGIES: Contrast Dye Iodine SOLN    MEDICATIONS: Hydrochlorothiazide  Hydralazine Hcl 10 mg tablet  Losartan Potassium  Sertraline Hcl 50 mg tablet     GU PSH: Complex cystometrogram, w/ void pressure and urethral pressure profile studies, any technique - 2021 Complex Uroflow - 2021 Cysto Bladder Ureth Biopsy - 02/06/2021, 2012 Cysto Uretero Lithotripsy - 2011 Cystoscopy - 01/19/2021, 2021, 2020, 2019, 2019, 2018, 2017 Cystoscopy Insert Stent - 2011 Cystoscopy TURBT >5 cm - 2011 Cystoscopy TURBT 2-5 cm - 2011 Emg surf Electrd - 2021 ESWL - 2011, 2008 Inject For cystogram - 2021 Intrabd voidng Press - 2021 Locm 300-'399Mg'$ /Ml Iodine,1Ml - 2020       PSH Notes: Cystoscopy With Biopsy, Cystoscopy With Fulguration Medium Lesion (2-5cm), Cystoscopy With Fulguration Large Lesion (Over 5cm), Cystoscopy With Ureteroscopy With Lithotripsy, Cystoscopy With Insertion Of Ureteral Stent Right, Lithotripsy, Surgery Of Tongue / Floor Of Mouth, Lithotripsy, Foot Surgery   NON-GU PSH: Anesth, Shoulder Replacement, Left Tongue  And Mouth Surgery - 2009     GU PMH: Gross hematuria - 05/01/2022, His gross hematuria was not due to any recurrent transitional cell carcinoma and he was quite pleased to learn that that was the case. It is prostatic in origin and we did discuss the off-label use of finasteride for this if it continues to be a problem. For now I am going to hold off on this medication but if he continues to have recurrent bleeding that would be the next step., - 2019 (Stable), His gross hematuria may have been from the passage of a small stone although he had no pain. It may have come from his prostate which is enlarged but I found no abnormality of the upper tract by CT scan and no evidence of recurrent transitional cell carcinoma the bladder., - 2019, Gross hematuria, - 2016 BPH w/LUTS - 12/21/2021, (Stable), - 06/08/2021, - 03/16/2021, - 02/12/2021, - 01/19/2021, - 01/01/2021, - 2022, - 2021, - 2021, - 2021, Benign prostatic hyperplasia with urinary obstruction, - 2016 Nocturia - 12/21/2021, - 06/08/2021, - 02/12/2021 (Stable), - 2021 (Stable), - 2018 (Stable), - 2017, Nocturia, - 2014 Renal calculus (Stable) - 12/21/2021, (Stable), - 11/12/2021 (Stable), He has a known right-sided stone burden that was stable on CT imaging performed last year. CT imaging also indicated no concerning GU lesion or abnormality considering his past history bladder cancer., - 2021, Nephrolithiasis, - 2016 History of bladder cancer - 02/12/2021, - 01/19/2021, - 2021 (Stable), He had no evidence of recurrent transitional cell carcinoma of the bladder today. He will tentatively be scheduled for follow-up in 1 year for surveillance cystoscopy., - 2020 (Stable), His bladder was clear of any recurrence. I have recommended continued yearly surveillance cystoscopy., - 2019, History of bladder cancer, - 2016 Incomplete bladder emptying - 02/12/2021, - 01/19/2021, - 2022 Acute Cystitis/UTI - 2021 Urinary Frequency - 2021 Weak Urinary Stream - 2021, (Stable), -  2018, Weak urinary stream, - 2016 Urinary Retention - 2021, - 2021, - 2021, - 2021, Prior to his recent shoulder surgery, he indicated to me he was voiding at his baseline with no new or worsening symptomatology including absence of gross hematuria, - 2021 Encounter for Prostate Cancer screening, He does not recall having a PSA done in some time. I will obtain a screening PSA today. - 2020 Microscopic hematuria, He was found to have microscopic hematuria  today. No abnormality was noted within the bladder but he has a history of CIS so his urine will be sent for cytology and further evaluation of his upper tract will be undertaken with a CT scan. - 2020 BPH w/o LUTS (Stable), I did note BPH today cystoscopically. He would likely benefit from finasteride if it is necessary to treat his intermittent prostatic bleeding in the future, from the standpoint of his voiding. - 2019 Renal cyst, Right, His CT scan revealed a simple cyst in the right kidney. This is of no clinical significance. - 2019 Bladder Cancer, Unspec - 2019, Bladder cancer, - 2015 Other microscopic hematuria, Microscopic hematuria - 2016 CIS of the bladder, Carcinoma in situ of bladder - 2015 Bladder Stone, Bladder calculus - 2015 ED due to arterial insufficiency, Erectile dysfunction due to arterial insufficiency - 2014 Hematuria, Unspec, Blood in urine - 2014 Low back pain, Lower back pain - 2014 Ureteral calculus, Calculus of ureter - 2014 Urinary Tract Inf, Unspec site, Pyuria - 2014      PMH Notes: Bladder cancer: He was incidentally found to have a bladder tumor at the time of ureteroscopy and underwent TURBT in 8/11.  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  Repeat bladder biopsy 9/11: Negative.  Treatment: Induction course of BCG completed in 12/11.  Repeat bladder biopsies 5/12: Positive for CIS from the right wall.  Treatment: Second induction course of  BCG completed 8/12.  1/14 CT: There was no evidence of any upper tract tumor, hydronephrosis or obvious bladder mass.   BPH with outlet obstruction: He has visual obstruction from BPH cystoscopically with a slow progression of voiding symptoms including nocturia and a weakened urinary stream.   History of nephrolithiasis: He underwent ESWL in 7/11 and subsequently required ureteroscopy for obstructing fragments.  1/14 CT: bilateral nonobstructing renal calculi. The largest was 8 mm in the right lower pole.     NON-GU PMH: Pyuria/other UA findings (Stable) - 03/16/2021, (Stable), - 01/01/2021, - 2022 Encounter for general adult medical examination without abnormal findings, Encounter for preventive health examination - 2014 Personal history of other diseases of the circulatory system, History of hypertension - 2014    FAMILY HISTORY: Family Health Status Number - Runs In Family Hypertension - Runs In Family nephrolithiasis - Father   SOCIAL HISTORY: Marital Status: Married Preferred Language: English; Ethnicity: Not Hispanic Or Latino; Race: White Current Smoking Status: Patient does not smoke anymore. Has not smoked since 03/03/1981.  Does not use smokeless tobacco. Has never drank.  Does not use drugs. Drinks 2 caffeinated drinks per day. Has not had a blood transfusion. Patient's occupation is/was Retired.    REVIEW OF SYSTEMS:    GU Review Male:   Patient denies frequent urination, hard to postpone urination, burning/ pain with urination, get up at night to urinate, leakage of urine, stream starts and stops, trouble starting your stream, have to strain to urinate , erection problems, and penile pain.  Gastrointestinal (Upper):   Patient denies nausea, vomiting, and indigestion/ heartburn.  Gastrointestinal (Lower):   Patient denies constipation and diarrhea.  Constitutional:   Patient denies fever, night sweats, weight loss, and fatigue.  Skin:   Patient denies skin rash/ lesion and  itching.  Eyes:   Patient denies blurred vision and double vision.  Ears/ Nose/ Throat:   Patient denies sore throat and sinus problems.  Hematologic/Lymphatic:   Patient denies swollen glands and easy bruising.  Cardiovascular:  Patient denies leg swelling and chest pains.  Respiratory:   Patient denies cough and shortness of breath.  Endocrine:   Patient denies excessive thirst.  Musculoskeletal:   Patient denies back pain and joint pain.  Neurological:   Patient denies headaches and dizziness.  Psychologic:   Patient denies depression and anxiety.   VITAL SIGNS: None   GU PHYSICAL EXAMINATION:    Testes: No tenderness, no swelling, no enlargement left testes. No tenderness, no swelling, no enlargement right testes. Normal location left testes. Normal location right testes. No mass, no cyst, no varicocele, no hydrocele left testes. No mass, no cyst, no varicocele, no hydrocele right testes.  Urethral Meatus: Normal size. No lesion, no wart, no discharge, no polyp. Normal location.  Penis: Circumcised, no warts, no cracks. No dorsal Peyronie's plaques, no left corporal Peyronie's plaques, no right corporal Peyronie's plaques, no scarring, no warts. No balanitis, no meatal stenosis.   MULTI-SYSTEM PHYSICAL EXAMINATION:    Constitutional: Obese. No physical deformities. Normally developed. Good grooming.   Neck: Neck symmetrical, not swollen. Normal tracheal position.  Respiratory: No labored breathing, no use of accessory muscles.   Cardiovascular: Normal temperature, normal extremity pulses, no swelling, no varicosities.  Lymphatic: No enlargement of neck, axillae, groin.  Skin: No paleness, no jaundice, no cyanosis. No lesion, no ulcer, no rash.  Neurologic / Psychiatric: Oriented to time, oriented to place, oriented to person. No depression, no anxiety, no agitation.  Gastrointestinal: No mass, no tenderness, no rigidity, non obese abdomen.  Eyes: Normal conjunctivae. Normal eyelids.   Ears, Nose, Mouth, and Throat: Left ear no scars, no lesions, no masses. Right ear no scars, no lesions, no masses. Nose no scars, no lesions, no masses. Normal hearing. Normal lips.  Musculoskeletal: Normal gait and station of head and neck.     Complexity of Data:  Source Of History:  Patient  Records Review:   Previous Doctor Records, Previous Hospital Records, Previous Patient Records  Urine Test Review:   Urinalysis   03/16/19 07/11/11 01/12/09 01/06/08 12/03/06 10/30/05 11/21/04 06/15/03  PSA  Total PSA 2.23 ng/mL 1.76  2.92  2.23  1.69  1.96  2.32  1.45     PROCEDURES:         Flexible Cystoscopy - 52000  Risks, benefits, and some of the potential complications of the procedure were discussed at length with the patient including infection, bleeding, voiding discomfort, urinary retention, fever, chills, sepsis, and others. All questions were answered. Informed consent was obtained. Antibiotic prophylaxis was given. Sterile technique and intraurethral analgesia were used.  Meatus:  Normal size. Normal location. Normal condition.  Urethra:  No strictures.  External Sphincter:  Normal.  Verumontanum:  Normal.  Prostate:  Non-obstructing. No hyperplasia.  Bladder Neck:  Non-obstructing.  Ureteral Orifices:  Normal location. Normal size. Normal shape. Effluxed clear urine.  Bladder:  Cystoscopy is performed today and shows: Small pendulous urethral stricture which I was able to pass with the scope without difficulty. Has bilobar prostatic hypertrophy with TUR defect. Bladder was inspected shows trabeculated bladder numerous cellules and several prior TURBT sites within the urinary bladder which were smooth and devoid of tumor. On the left floor of the bladder adjacent to prior TURBT site there is a slightly raised erythematous area measuring probably 2 cm in size which is suspicious for recurrent carcinoma. Ureteral orifice ease were well away from these areas and effluxing clear urine.       The lower urinary tract was carefully examined. The  procedure was well-tolerated and without complications. Antibiotic instructions were given. Instructions were given to call the office immediately for bloody urine, difficulty urinating, urinary retention, painful or frequent urination, fever, chills, nausea, vomiting or other illness. The patient stated that he understood these instructions and would comply with them.         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 Recommended cystoscopy bilateral retrogrades and TURBT and we will schedule accordingly in the near future. Risk and benefits discussed as outlined below.  TURBT consent: I have discussed with the patient the risks, benefits of TURBT which include but are not limited to: Bleeding, infection, damage to the bladder with potential perforation of the bladder, damage to surrounding organs, possible need for further procedures including open repair and catheterization, possibility of nonhealing area within the bladder, urgency, frequency which may be refractory to medications. I pointed out that in some occasions after resection of the bladder tumor, mitomycin-C chemotherapy may be instilled into the bladder. The risks associated with this therapy include but are not limited to: Refractory or new  onset urgency, frequency, dysuria, infrequently severe systemic side effects secondary to mitomycin-C. After full discussion of the risks, benefits and alternatives, the patient has consented to the above procedure and desires to proceed.

## 2022-05-21 ENCOUNTER — Other Ambulatory Visit: Payer: Self-pay

## 2022-05-21 ENCOUNTER — Ambulatory Visit (HOSPITAL_BASED_OUTPATIENT_CLINIC_OR_DEPARTMENT_OTHER): Payer: Medicare PPO | Admitting: Anesthesiology

## 2022-05-21 ENCOUNTER — Encounter (HOSPITAL_BASED_OUTPATIENT_CLINIC_OR_DEPARTMENT_OTHER)
Admission: RE | Disposition: A | Discharge status: Home / Self Care | Payer: Self-pay | Source: Home / Self Care | Attending: Urology

## 2022-05-21 ENCOUNTER — Encounter (HOSPITAL_BASED_OUTPATIENT_CLINIC_OR_DEPARTMENT_OTHER): Payer: Self-pay | Admitting: Urology

## 2022-05-21 ENCOUNTER — Ambulatory Visit (HOSPITAL_BASED_OUTPATIENT_CLINIC_OR_DEPARTMENT_OTHER)
Admission: RE | Admit: 2022-05-21 | Discharge: 2022-05-21 | Disposition: A | Discharge status: Home / Self Care | Payer: Medicare PPO | Attending: Urology | Admitting: Urology

## 2022-05-21 DIAGNOSIS — N3031 Trigonitis with hematuria: Secondary | ICD-10-CM | POA: Diagnosis not present

## 2022-05-21 DIAGNOSIS — I10 Essential (primary) hypertension: Secondary | ICD-10-CM | POA: Diagnosis not present

## 2022-05-21 DIAGNOSIS — I082 Rheumatic disorders of both aortic and tricuspid valves: Secondary | ICD-10-CM | POA: Insufficient documentation

## 2022-05-21 DIAGNOSIS — I440 Atrioventricular block, first degree: Secondary | ICD-10-CM | POA: Diagnosis not present

## 2022-05-21 DIAGNOSIS — Z79899 Other long term (current) drug therapy: Secondary | ICD-10-CM | POA: Insufficient documentation

## 2022-05-21 DIAGNOSIS — C679 Malignant neoplasm of bladder, unspecified: Secondary | ICD-10-CM | POA: Diagnosis not present

## 2022-05-21 DIAGNOSIS — Z87891 Personal history of nicotine dependence: Secondary | ICD-10-CM | POA: Insufficient documentation

## 2022-05-21 DIAGNOSIS — Z8551 Personal history of malignant neoplasm of bladder: Secondary | ICD-10-CM | POA: Insufficient documentation

## 2022-05-21 DIAGNOSIS — R310 Gross hematuria: Secondary | ICD-10-CM | POA: Diagnosis not present

## 2022-05-21 DIAGNOSIS — N3030 Trigonitis without hematuria: Secondary | ICD-10-CM | POA: Diagnosis not present

## 2022-05-21 DIAGNOSIS — Z01818 Encounter for other preprocedural examination: Secondary | ICD-10-CM

## 2022-05-21 DIAGNOSIS — M1990 Unspecified osteoarthritis, unspecified site: Secondary | ICD-10-CM | POA: Diagnosis not present

## 2022-05-21 DIAGNOSIS — N401 Enlarged prostate with lower urinary tract symptoms: Secondary | ICD-10-CM | POA: Insufficient documentation

## 2022-05-21 DIAGNOSIS — N3081 Other cystitis with hematuria: Secondary | ICD-10-CM | POA: Diagnosis not present

## 2022-05-21 DIAGNOSIS — D303 Benign neoplasm of bladder: Secondary | ICD-10-CM | POA: Diagnosis not present

## 2022-05-21 HISTORY — PX: TRANSURETHRAL RESECTION OF BLADDER TUMOR: SHX2575

## 2022-05-21 HISTORY — DX: Thoracic aortic ectasia: I77.810

## 2022-05-21 HISTORY — PX: CYSTOSCOPY W/ RETROGRADES: SHX1426

## 2022-05-21 HISTORY — DX: Malignant neoplasm of bladder, unspecified: C67.9

## 2022-05-21 LAB — POCT I-STAT, CHEM 8
BUN: 18 mg/dL (ref 8–23)
Calcium, Ion: 1.32 mmol/L (ref 1.15–1.40)
Chloride: 106 mmol/L (ref 98–111)
Creatinine, Ser: 0.9 mg/dL (ref 0.61–1.24)
Glucose, Bld: 104 mg/dL — ABNORMAL HIGH (ref 70–99)
HCT: 40 % (ref 39.0–52.0)
Hemoglobin: 13.6 g/dL (ref 13.0–17.0)
Potassium: 4.1 mmol/L (ref 3.5–5.1)
Sodium: 143 mmol/L (ref 135–145)
TCO2: 27 mmol/L (ref 22–32)

## 2022-05-21 SURGERY — TURBT (TRANSURETHRAL RESECTION OF BLADDER TUMOR)
Anesthesia: General | Site: Bladder

## 2022-05-21 MED ORDER — LACTATED RINGERS IV SOLN: INTRAVENOUS | Status: DC

## 2022-05-21 MED ORDER — FENTANYL CITRATE (PF) 100 MCG/2ML IJ SOLN
INTRAMUSCULAR | Status: AC
  Filled 2022-05-21: qty 2

## 2022-05-21 MED ORDER — GLYCOPYRROLATE PF 0.2 MG/ML IJ SOSY
PREFILLED_SYRINGE | INTRAMUSCULAR | Status: AC
  Filled 2022-05-21: qty 1

## 2022-05-21 MED ORDER — OXYCODONE HCL 5 MG/5ML PO SOLN: 5.0000 mg | Freq: Once | ORAL | Status: DC | PRN

## 2022-05-21 MED ORDER — CEFAZOLIN SODIUM-DEXTROSE 2-4 GM/100ML-% IV SOLN
INTRAVENOUS | Status: AC
  Filled 2022-05-21: qty 100

## 2022-05-21 MED ORDER — PHENYLEPHRINE 80 MCG/ML (10ML) SYRINGE FOR IV PUSH (FOR BLOOD PRESSURE SUPPORT)
PREFILLED_SYRINGE | INTRAVENOUS | Status: DC | PRN
  Administered 2022-05-21 (×3): 80 ug via INTRAVENOUS

## 2022-05-21 MED ORDER — FENTANYL CITRATE (PF) 100 MCG/2ML IJ SOLN: 25.0000 ug | INTRAMUSCULAR | Status: DC | PRN

## 2022-05-21 MED ORDER — IOHEXOL 300 MG/ML  SOLN
INTRAMUSCULAR | Status: DC | PRN
  Administered 2022-05-21: 10 mL via URETHRAL

## 2022-05-21 MED ORDER — ACETAMINOPHEN 10 MG/ML IV SOLN: 1000.0000 mg | Freq: Once | INTRAVENOUS | Status: DC | PRN

## 2022-05-21 MED ORDER — PHENYLEPHRINE 80 MCG/ML (10ML) SYRINGE FOR IV PUSH (FOR BLOOD PRESSURE SUPPORT)
PREFILLED_SYRINGE | INTRAVENOUS | Status: AC
  Filled 2022-05-21: qty 10

## 2022-05-21 MED ORDER — EPHEDRINE 5 MG/ML INJ
INTRAVENOUS | Status: AC
  Filled 2022-05-21: qty 5

## 2022-05-21 MED ORDER — EPHEDRINE SULFATE-NACL 50-0.9 MG/10ML-% IV SOSY
PREFILLED_SYRINGE | INTRAVENOUS | Status: DC | PRN
  Administered 2022-05-21: 10 mg via INTRAVENOUS
  Administered 2022-05-21: 5 mg via INTRAVENOUS
  Administered 2022-05-21: 15 mg via INTRAVENOUS

## 2022-05-21 MED ORDER — STERILE WATER FOR IRRIGATION IR SOLN
Status: DC | PRN
  Administered 2022-05-21: 3000 mL

## 2022-05-21 MED ORDER — FENTANYL CITRATE (PF) 100 MCG/2ML IJ SOLN
INTRAMUSCULAR | Status: DC | PRN
  Administered 2022-05-21: 50 ug via INTRAVENOUS

## 2022-05-21 MED ORDER — DEXAMETHASONE SODIUM PHOSPHATE 10 MG/ML IJ SOLN
INTRAMUSCULAR | Status: DC | PRN
  Administered 2022-05-21: 5 mg via INTRAVENOUS

## 2022-05-21 MED ORDER — ONDANSETRON HCL 4 MG/2ML IJ SOLN
INTRAMUSCULAR | Status: DC | PRN
  Administered 2022-05-21: 4 mg via INTRAVENOUS

## 2022-05-21 MED ORDER — TRAMADOL HCL 50 MG PO TABS: 50.0000 mg | ORAL_TABLET | Freq: Four times a day (QID) | ORAL | 0 refills | Status: DC | PRN

## 2022-05-21 MED ORDER — DEXAMETHASONE SODIUM PHOSPHATE 10 MG/ML IJ SOLN
INTRAMUSCULAR | Status: AC
  Filled 2022-05-21: qty 1

## 2022-05-21 MED ORDER — CEFAZOLIN SODIUM-DEXTROSE 2-4 GM/100ML-% IV SOLN
2.0000 g | INTRAVENOUS | Status: AC
  Administered 2022-05-21: 2 g via INTRAVENOUS

## 2022-05-21 MED ORDER — PROPOFOL 10 MG/ML IV BOLUS
INTRAVENOUS | Status: AC
  Filled 2022-05-21: qty 20

## 2022-05-21 MED ORDER — LIDOCAINE 2% (20 MG/ML) 5 ML SYRINGE
INTRAMUSCULAR | Status: DC | PRN
  Administered 2022-05-21: 60 mg via INTRAVENOUS

## 2022-05-21 MED ORDER — ONDANSETRON HCL 4 MG/2ML IJ SOLN
INTRAMUSCULAR | Status: AC
  Filled 2022-05-21: qty 2

## 2022-05-21 MED ORDER — OXYCODONE HCL 5 MG PO TABS: 5.0000 mg | ORAL_TABLET | Freq: Once | ORAL | Status: DC | PRN

## 2022-05-21 MED ORDER — GLYCOPYRROLATE PF 0.2 MG/ML IJ SOSY
PREFILLED_SYRINGE | INTRAMUSCULAR | Status: DC | PRN
  Administered 2022-05-21: .1 mg via INTRAVENOUS

## 2022-05-21 MED ORDER — PROPOFOL 10 MG/ML IV BOLUS
INTRAVENOUS | Status: DC | PRN
  Administered 2022-05-21: 180 mg via INTRAVENOUS

## 2022-05-21 MED ORDER — LIDOCAINE HCL (PF) 2 % IJ SOLN
INTRAMUSCULAR | Status: AC
  Filled 2022-05-21: qty 5

## 2022-05-21 SURGICAL SUPPLY — 36 items
BAG DRAIN URO-CYSTO SKYTR STRL (DRAIN) ×2 IMPLANT
BAG DRN RND TRDRP ANRFLXCHMBR (UROLOGICAL SUPPLIES)
BAG DRN UROCATH (DRAIN) ×2
BAG URINE DRAIN 2000ML AR STRL (UROLOGICAL SUPPLIES) IMPLANT
BAG URINE LEG 500ML (DRAIN) IMPLANT
BULB IRRIG PATHFIND (MISCELLANEOUS) IMPLANT
CATH FOLEY 2WAY SLVR  5CC 20FR (CATHETERS)
CATH FOLEY 2WAY SLVR  5CC 22FR (CATHETERS)
CATH FOLEY 2WAY SLVR 5CC 20FR (CATHETERS) IMPLANT
CATH FOLEY 2WAY SLVR 5CC 22FR (CATHETERS) IMPLANT
CATH URET 5FR 28IN CONE TIP (BALLOONS)
CATH URET 5FR 70CM CONE TIP (BALLOONS) IMPLANT
CATH URETL OPEN 5X70 (CATHETERS) IMPLANT
CLOTH BEACON ORANGE TIMEOUT ST (SAFETY) ×2 IMPLANT
ELECT REM PT RETURN 9FT ADLT (ELECTROSURGICAL) ×2
ELECTRODE REM PT RTRN 9FT ADLT (ELECTROSURGICAL) ×2 IMPLANT
EVACUATOR MICROVAS BLADDER (UROLOGICAL SUPPLIES) IMPLANT
FIBER LASER FLEXIVA 365 (UROLOGICAL SUPPLIES) IMPLANT
GLOVE BIO SURGEON STRL SZ7.5 (GLOVE) ×2 IMPLANT
GOWN STRL REUS W/TWL LRG LVL3 (GOWN DISPOSABLE) ×2 IMPLANT
GUIDEWIRE ANG ZIPWIRE 038X150 (WIRE) IMPLANT
GUIDEWIRE STR DUAL SENSOR (WIRE) IMPLANT
HOLDER FOLEY CATH W/STRAP (MISCELLANEOUS) IMPLANT
IV NS IRRIG 3000ML ARTHROMATIC (IV SOLUTION) ×2 IMPLANT
KIT TURNOVER CYSTO (KITS) ×2 IMPLANT
LOOP MONOPOLAR YLW (ELECTROSURGICAL) IMPLANT
MANIFOLD NEPTUNE II (INSTRUMENTS) IMPLANT
PACK CYSTO (CUSTOM PROCEDURE TRAY) ×2 IMPLANT
PLUG CATH AND CAP STER (CATHETERS) IMPLANT
SYR 20ML LL LF (SYRINGE) ×2 IMPLANT
SYR TOOMEY IRRIG 70ML (MISCELLANEOUS)
SYRINGE TOOMEY IRRIG 70ML (MISCELLANEOUS) IMPLANT
TRACTIP FLEXIVA PULS ID 200XHI (Laser) IMPLANT
TRACTIP FLEXIVA PULSE ID 200 (Laser)
TUBE CONNECTING 12X1/4 (SUCTIONS) IMPLANT
WATER STERILE IRR 3000ML UROMA (IV SOLUTION) IMPLANT

## 2022-05-21 NOTE — Transfer of Care (Signed)
Immediate Anesthesia Transfer of Care Note  Patient: Richard King  Procedure(s) Performed: Procedure(s) (LRB): TRANSURETHRAL RESECTION OF BLADDER TUMOR (TURBT) (N/A) CYSTOSCOPY WITH RETROGRADE PYELOGRAM (Bilateral)  Patient Location: PACU  Anesthesia Type: General  Level of Consciousness: awake, alert  and oriented  Airway & Oxygen Therapy: Patient Spontanous Breathing and Patient connected to nasal cannula oxygen  Post-op Assessment: Report given to PACU RN and Post -op Vital signs reviewed and stable  Post vital signs: Reviewed and stable  Complications: No apparent anesthesia complications  Last Vitals:  Vitals Value Taken Time  BP 134/80 05/21/22 1424  Temp    Pulse 86 05/21/22 1429  Resp 14 05/21/22 1429  SpO2 95 % 05/21/22 1429  Vitals shown include unvalidated device data.  Last Pain:  Vitals:   05/21/22 1049  TempSrc: Oral         Complications: No notable events documented.

## 2022-05-21 NOTE — Anesthesia Preprocedure Evaluation (Addendum)
Anesthesia Evaluation  Patient identified by MRN, date of birth, ID band Patient awake    Reviewed: Allergy & Precautions, NPO status , Patient's Chart, lab work & pertinent test results  Airway Mallampati: II  TM Distance: >3 FB Neck ROM: Full    Dental no notable dental hx.    Pulmonary former smoker   Pulmonary exam normal        Cardiovascular hypertension, Pt. on medications  Rhythm:Regular Rate:Normal  ECHO 11/23:  1. Left ventricular ejection fraction, by estimation, is 50 to 55%. The  left ventricle has low normal function. The left ventricle has no regional  wall motion abnormalities. Left ventricular diastolic parameters are  consistent with Grade I diastolic  dysfunction (impaired relaxation).   2. Right ventricular systolic function is normal. The right ventricular  size is normal. Tricuspid regurgitation signal is inadequate for assessing  PA pressure.   3. The mitral valve is normal in structure. Mild mitral valve  regurgitation. No evidence of mitral stenosis.   4. The aortic valve is normal in structure. Aortic valve regurgitation is  mild. Aortic valve sclerosis/calcification is present, without any  evidence of aortic stenosis.   5. There is moderate dilatation of the aortic root, measuring 45 mm.   6. The inferior vena cava is normal in size with greater than 50%  respiratory variability, suggesting right atrial pressure of 3 mmHg.     Neuro/Psych negative neurological ROS  negative psych ROS   GI/Hepatic negative GI ROS, Neg liver ROS,,,  Endo/Other  negative endocrine ROS    Renal/GU negative Renal ROS Bladder dysfunction      Musculoskeletal  (+) Arthritis , Osteoarthritis,    Abdominal Normal abdominal exam  (+)   Peds  Hematology negative hematology ROS (+)   Anesthesia Other Findings   Reproductive/Obstetrics                             Anesthesia  Physical Anesthesia Plan  ASA: 2  Anesthesia Plan: General   Post-op Pain Management:    Induction: Intravenous  PONV Risk Score and Plan: 2 and Ondansetron, Dexamethasone and Treatment may vary due to age or medical condition  Airway Management Planned: Mask and LMA  Additional Equipment: None  Intra-op Plan:   Post-operative Plan: Extubation in OR  Informed Consent: I have reviewed the patients History and Physical, chart, labs and discussed the procedure including the risks, benefits and alternatives for the proposed anesthesia with the patient or authorized representative who has indicated his/her understanding and acceptance.     Dental advisory given  Plan Discussed with: CRNA  Anesthesia Plan Comments:        Anesthesia Quick Evaluation

## 2022-05-21 NOTE — Discharge Instructions (Signed)

## 2022-05-21 NOTE — Interval H&P Note (Signed)
History and Physical Interval Note:  05/21/2022 11:33 AM  Richard King  has presented today for surgery, with the diagnosis of RECURRENT BLADDER CANCER, HEMATURIA.  The various methods of treatment have been discussed with the patient and family. After consideration of risks, benefits and other options for treatment, the patient has consented to  Procedure(s) with comments: TRANSURETHRAL RESECTION OF BLADDER TUMOR (TURBT) (N/A) - 30 MINS CYSTOSCOPY WITH RETROGRADE PYELOGRAM (Bilateral) as a surgical intervention.  The patient's history has been reviewed, patient examined, no change in status, stable for surgery.  I have reviewed the patient's chart and labs.  Questions were answered to the patient's satisfaction.     Remi Haggard

## 2022-05-21 NOTE — Anesthesia Procedure Notes (Signed)
Procedure Name: LMA Insertion Date/Time: 05/21/2022 1:47 PM  Performed by: Mechele Claude, CRNAPre-anesthesia Checklist: Patient identified, Emergency Drugs available, Suction available and Patient being monitored Patient Re-evaluated:Patient Re-evaluated prior to induction Oxygen Delivery Method: Circle system utilized Preoxygenation: Pre-oxygenation with 100% oxygen Induction Type: IV induction Ventilation: Mask ventilation without difficulty LMA: LMA inserted LMA Size: 5.0 Number of attempts: 1 Airway Equipment and Method: Bite block Placement Confirmation: positive ETCO2 Tube secured with: Tape Dental Injury: Teeth and Oropharynx as per pre-operative assessment

## 2022-05-21 NOTE — Op Note (Signed)
Preoperative diagnosis:  1.  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  Same  Procedure(s): 1.  Cystoscopy, bilateral retrograde pyelogram with intraoperative interpretation, transurethral section of bladder tumor (small)  Surgeon: Dr. 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  No other tumors.  Area completely removed with rigid biopsy forceps and fulgurated.  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  Not had a recurrence in over 5 years.  Is had some recurrent hematuria.  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  Presents at this time undergo cystoscopy bilateral retrogrades and TURBT.  Description of procedure:  After 20 informed consent the patient was taken the major cystoscopy suite placed under general anesthesia.  Placed in the dorsolithotomy position genitalia prepped and draped in usual sterile fashion.  Proper pause and timeout was performed.  72 French cystoscope was advanced in the bladder.  Patient had mild hypervascularity within the prostatic urethra passage of the scope caused some oozing from the bladder neck which was subsequently cauterized with the Bugbee electrode.  Bladder was inspected with 30 and 70 degree lenses with the above-noted findings.  It was felt that the area of of concern would be best removed utilizing rigid biopsy forceps to preserve pathologic integrity.  Rigid biopsy forceps ultimately remove the entire abnormal appearing area in the left floor of the bladder and sent to pathology for  evaluation.  The area was then Bugbee cauterized and the area of resection as well as a small area circumferentially to ensure complete treatment of the area.  Both ureteral orifice ease were identified.  They were cannulated over a wire because they were fairly tight.  5 French catheter was utilized to cannulate.  Retrograde pyelograms were performed on both sides revealing normal filling of both upper tracts without evidence of filling defect or hydronephrosis.  Both upper tract emptied out promptly upon removal of the retrograde catheter.  Bladder was emptied the area of resection had good hemostasis.  Bladder neck also had good hemostasis.  Scope was removed procedure terminated he was awakened from anesthesia and taken back to recovery in stable condition.  No immediate complication from the procedure.

## 2022-05-22 ENCOUNTER — Encounter (HOSPITAL_BASED_OUTPATIENT_CLINIC_OR_DEPARTMENT_OTHER): Payer: Self-pay | Admitting: Urology

## 2022-05-22 LAB — SURGICAL PATHOLOGY

## 2022-05-22 NOTE — Anesthesia Postprocedure Evaluation (Signed)
Anesthesia Post Note  Patient: HASHEEM VOLAND  Procedure(s) Performed: TRANSURETHRAL RESECTION OF BLADDER TUMOR (TURBT) (Bladder) CYSTOSCOPY WITH RETROGRADE PYELOGRAM (Bilateral: Bladder)     Patient location during evaluation: PACU Anesthesia Type: General Level of consciousness: awake and alert Pain management: pain level controlled Vital Signs Assessment: post-procedure vital signs reviewed and stable Respiratory status: spontaneous breathing, nonlabored ventilation, respiratory function stable and patient connected to nasal cannula oxygen Cardiovascular status: blood pressure returned to baseline and stable Postop Assessment: no apparent nausea or vomiting Anesthetic complications: no   No notable events documented.  Last Vitals:  Vitals:   05/21/22 1500 05/21/22 1600  BP: 136/86 125/77  Pulse: 82 85  Resp: 11   Temp:  36.4 C  SpO2: 91% 91%    Last Pain:  Vitals:   05/21/22 1600  TempSrc:   PainSc: 0-No pain                 Belenda Cruise P Makalynn Berwanger

## 2022-05-23 ENCOUNTER — Ambulatory Visit (INDEPENDENT_AMBULATORY_CARE_PROVIDER_SITE_OTHER): Payer: Medicare PPO

## 2022-05-23 DIAGNOSIS — E538 Deficiency of other specified B group vitamins: Secondary | ICD-10-CM | POA: Diagnosis not present

## 2022-05-23 MED ORDER — CYANOCOBALAMIN 1000 MCG/ML IJ SOLN
1000.0000 ug | Freq: Once | INTRAMUSCULAR | Status: AC
  Administered 2022-05-23: 1000 ug via INTRAMUSCULAR

## 2022-05-23 NOTE — Progress Notes (Signed)
Per orders of Dr. Duncan, injection of vit B12 given by Gopal Malter. Patient tolerated injection well.  

## 2022-05-29 DIAGNOSIS — Z8551 Personal history of malignant neoplasm of bladder: Secondary | ICD-10-CM | POA: Diagnosis not present

## 2022-05-29 DIAGNOSIS — R310 Gross hematuria: Secondary | ICD-10-CM | POA: Diagnosis not present

## 2022-05-29 DIAGNOSIS — N200 Calculus of kidney: Secondary | ICD-10-CM | POA: Diagnosis not present

## 2022-05-29 DIAGNOSIS — N400 Enlarged prostate without lower urinary tract symptoms: Secondary | ICD-10-CM | POA: Diagnosis not present

## 2022-06-25 ENCOUNTER — Ambulatory Visit (INDEPENDENT_AMBULATORY_CARE_PROVIDER_SITE_OTHER): Payer: Medicare PPO | Admitting: *Deleted

## 2022-06-25 DIAGNOSIS — E538 Deficiency of other specified B group vitamins: Secondary | ICD-10-CM

## 2022-06-25 MED ORDER — CYANOCOBALAMIN 1000 MCG/ML IJ SOLN
1000.0000 ug | Freq: Once | INTRAMUSCULAR | Status: AC
  Administered 2022-06-25: 1000 ug via INTRAMUSCULAR

## 2022-06-25 NOTE — Progress Notes (Signed)
Per orders of Dr. Gutierrez, injection of Vitamin B-12 given by Jru Pense. Patient tolerated injection well. 

## 2022-07-09 NOTE — Progress Notes (Unsigned)
    Chrisanna Mishra T. Hitoshi Werts, MD, [place] [facility] [place], [address]  Phone: [phone]  FAX: [phone]  Richard King - 82 y.o. male  MRN [id number]  Date of Birth: 06/18/1940  Date: 07/10/2022  PCP: Tonia Ghent, MD  Referral: Tonia Ghent, MD  No chief complaint on file.  Subjective:   Richard King is a 82 y.o. very pleasant male patient with There is no height or weight on file to calculate BMI. who presents with the following:  Pleasant 82 year old gentleman, he presents with an acute sore throat.    Review of Systems is noted in the HPI, as appropriate  Objective:   There were no vitals taken for this visit.  GEN: No acute distress; alert,appropriate. PULM: Breathing comfortably in no respiratory distress PSYCH: Normally interactive.   Laboratory and Imaging Data:  Assessment and Plan:   ***

## 2022-07-10 ENCOUNTER — Ambulatory Visit: Payer: Medicare PPO | Admitting: Family Medicine

## 2022-07-10 ENCOUNTER — Encounter: Payer: Self-pay | Admitting: Family Medicine

## 2022-07-10 VITALS — BP 118/60 | HR 69 | Temp 97.9°F | Ht 71.0 in | Wt 243.5 lb

## 2022-07-10 DIAGNOSIS — J069 Acute upper respiratory infection, unspecified: Secondary | ICD-10-CM | POA: Diagnosis not present

## 2022-07-10 DIAGNOSIS — J029 Acute pharyngitis, unspecified: Secondary | ICD-10-CM | POA: Diagnosis not present

## 2022-07-10 LAB — POC COVID19 BINAXNOW: SARS Coronavirus 2 Ag: NEGATIVE

## 2022-07-10 MED ORDER — AMOXICILLIN 875 MG PO TABS: 875.0000 mg | ORAL_TABLET | Freq: Two times a day (BID) | ORAL | 0 refills | Status: DC

## 2022-07-10 MED ORDER — BENZONATATE 200 MG PO CAPS: 200.0000 mg | ORAL_CAPSULE | Freq: Three times a day (TID) | ORAL | 1 refills | Status: DC | PRN

## 2022-07-22 ENCOUNTER — Other Ambulatory Visit: Payer: Self-pay | Admitting: Family Medicine

## 2022-07-30 ENCOUNTER — Ambulatory Visit (INDEPENDENT_AMBULATORY_CARE_PROVIDER_SITE_OTHER): Payer: Medicare PPO

## 2022-07-30 DIAGNOSIS — L720 Epidermal cyst: Secondary | ICD-10-CM | POA: Diagnosis not present

## 2022-07-30 DIAGNOSIS — E538 Deficiency of other specified B group vitamins: Secondary | ICD-10-CM

## 2022-07-30 MED ORDER — CYANOCOBALAMIN 1000 MCG/ML IJ SOLN
1000.0000 ug | Freq: Once | INTRAMUSCULAR | Status: AC
  Administered 2022-07-30: 1000 ug via INTRAMUSCULAR

## 2022-07-30 NOTE — Progress Notes (Signed)
Per orders of Dr. Elsie Stain, injection of Vitamin B 12 given in right deltoid given by Ozzie Hoyle. Patient tolerated injection well.

## 2022-08-16 DIAGNOSIS — L258 Unspecified contact dermatitis due to other agents: Secondary | ICD-10-CM | POA: Diagnosis not present

## 2022-08-26 DIAGNOSIS — R310 Gross hematuria: Secondary | ICD-10-CM | POA: Diagnosis not present

## 2022-08-26 DIAGNOSIS — R3914 Feeling of incomplete bladder emptying: Secondary | ICD-10-CM | POA: Diagnosis not present

## 2022-09-03 ENCOUNTER — Ambulatory Visit (INDEPENDENT_AMBULATORY_CARE_PROVIDER_SITE_OTHER): Payer: Medicare PPO | Admitting: *Deleted

## 2022-09-03 DIAGNOSIS — E538 Deficiency of other specified B group vitamins: Secondary | ICD-10-CM

## 2022-09-03 MED ORDER — CYANOCOBALAMIN 1000 MCG/ML IJ SOLN
1000.0000 ug | Freq: Once | INTRAMUSCULAR | Status: AC
  Administered 2022-09-03: 1000 ug via INTRAMUSCULAR

## 2022-09-03 NOTE — Progress Notes (Signed)
Per orders of Dr. Duncan, injection of Vitamin B-12 given by Etty Isaac. Patient tolerated injection well. 

## 2022-09-05 ENCOUNTER — Ambulatory Visit: Payer: Medicare PPO | Admitting: Family Medicine

## 2022-09-05 ENCOUNTER — Encounter: Payer: Self-pay | Admitting: Family Medicine

## 2022-09-05 VITALS — BP 120/62 | HR 66 | Temp 98.1°F | Ht 71.0 in | Wt 243.0 lb

## 2022-09-05 DIAGNOSIS — I77810 Thoracic aortic ectasia: Secondary | ICD-10-CM

## 2022-09-05 DIAGNOSIS — I10 Essential (primary) hypertension: Secondary | ICD-10-CM

## 2022-09-05 DIAGNOSIS — R4586 Emotional lability: Secondary | ICD-10-CM

## 2022-09-05 DIAGNOSIS — Z7189 Other specified counseling: Secondary | ICD-10-CM

## 2022-09-05 DIAGNOSIS — E538 Deficiency of other specified B group vitamins: Secondary | ICD-10-CM | POA: Diagnosis not present

## 2022-09-05 DIAGNOSIS — R059 Cough, unspecified: Secondary | ICD-10-CM

## 2022-09-05 DIAGNOSIS — G4700 Insomnia, unspecified: Secondary | ICD-10-CM

## 2022-09-05 LAB — CBC WITH DIFFERENTIAL/PLATELET
Basophils Absolute: 0 10*3/uL (ref 0.0–0.1)
Basophils Relative: 0.5 % (ref 0.0–3.0)
Eosinophils Absolute: 0.2 10*3/uL (ref 0.0–0.7)
Eosinophils Relative: 3.6 % (ref 0.0–5.0)
HCT: 38 % — ABNORMAL LOW (ref 39.0–52.0)
Hemoglobin: 12.8 g/dL — ABNORMAL LOW (ref 13.0–17.0)
Lymphocytes Relative: 24.2 % (ref 12.0–46.0)
Lymphs Abs: 1.3 10*3/uL (ref 0.7–4.0)
MCHC: 33.7 g/dL (ref 30.0–36.0)
MCV: 95.3 fl (ref 78.0–100.0)
Monocytes Absolute: 0.6 10*3/uL (ref 0.1–1.0)
Monocytes Relative: 11.6 % (ref 3.0–12.0)
Neutro Abs: 3.3 10*3/uL (ref 1.4–7.7)
Neutrophils Relative %: 60.1 % (ref 43.0–77.0)
Platelets: 195 10*3/uL (ref 150.0–400.0)
RBC: 3.99 Mil/uL — ABNORMAL LOW (ref 4.22–5.81)
RDW: 13.5 % (ref 11.5–15.5)
WBC: 5.5 10*3/uL (ref 4.0–10.5)

## 2022-09-05 LAB — COMPREHENSIVE METABOLIC PANEL
ALT: 12 U/L (ref 0–53)
AST: 16 U/L (ref 0–37)
Albumin: 4.2 g/dL (ref 3.5–5.2)
Alkaline Phosphatase: 79 U/L (ref 39–117)
BUN: 16 mg/dL (ref 6–23)
CO2: 31 mEq/L (ref 19–32)
Calcium: 9.8 mg/dL (ref 8.4–10.5)
Chloride: 105 mEq/L (ref 96–112)
Creatinine, Ser: 0.98 mg/dL (ref 0.40–1.50)
GFR: 71.95 mL/min (ref >60.00)
Glucose, Bld: 100 mg/dL — ABNORMAL HIGH (ref 70–99)
Potassium: 5.3 mEq/L — ABNORMAL HIGH (ref 3.5–5.1)
Sodium: 143 mEq/L (ref 135–145)
Total Bilirubin: 0.7 mg/dL (ref 0.2–1.2)
Total Protein: 6.7 g/dL (ref 6.0–8.3)

## 2022-09-05 LAB — VITAMIN B12: Vitamin B-12: 1197 pg/mL — ABNORMAL HIGH (ref 211–911)

## 2022-09-05 LAB — LIPID PANEL
Cholesterol: 159 mg/dL (ref 0–200)
HDL: 53.9 mg/dL (ref >39.00)
LDL Cholesterol: 88 mg/dL (ref 0–99)
NonHDL: 105.46
Total CHOL/HDL Ratio: 3
Triglycerides: 89 mg/dL (ref 0.0–149.0)
VLDL: 17.8 mg/dL (ref 0.0–40.0)

## 2022-09-05 MED ORDER — FLUTICASONE PROPIONATE 50 MCG/ACT NA SUSP: 2.0000 | Freq: Every day | NASAL | 6 refills | Status: AC

## 2022-09-05 NOTE — Progress Notes (Signed)
Some nights he sleeps better than others, has slept well the last few weeks.    Hypertension:    Using medication without problems or lightheadedness: yes Chest pain with exertion:no Edema: some occ ankle edema.  More at the end of the day.   Short of breath:no He felt better with lower dose of losartan.  Compliant.    B12 def.  On replacement.  Recheck level pending.    Mood d/w pt.  "Pretty good."  No ADE on med sertraline.  "I'm doing all right."    Current Outpatient Medications on File Prior to Visit  Medication Sig Dispense Refill   cyanocobalamin (,VITAMIN B-12,) 1000 MCG/ML injection 1022mcg IM every 30 days 1 mL    doxazosin (CARDURA) 4 MG tablet Take 1 tablet (4 mg total) by mouth daily. 90 tablet 3   hydrochlorothiazide (MICROZIDE) 12.5 MG capsule TAKE 1 CAPSULE (12.5 MG TOTAL) BY MOUTH DAILY. 90 capsule 3   losartan (COZAAR) 50 MG tablet Take 0.5 tablets (25 mg total) by mouth daily.     sertraline (ZOLOFT) 50 MG tablet Take 1 tablet (50 mg total) by mouth daily. 90 tablet 3   No current facility-administered medications on file prior to visit.   Rare use of tramadol.  No recent use.  Removed from med list.    Discussed getting echo done yearly but not due currently.    Advance directive d/w pt. Daughter Rodena Piety or [name] to make health care decisions if needed.  Colon and prostate cancer screening deferred given his age, d/w pt.  He agrees.    Cough d/w pt.  Only tried [employer].  No cough in the last few days.  Occ sputum, occ cough at baseline.  He attributed cough to sinus drainage.    Meds, vitals, and allergies reviewed.   PMH and SH reviewed  ROS: Per HPI unless specifically indicated in ROS section   GEN: nad, alert and oriented HEENT: mucous membranes moist NECK: supple w/o LA CV: rrr. PULM: ctab, no inc wob ABD: soft, +bs EXT: no edema SKIN: no acute rash Nasal congestion B.  Tm wnl B. Op wnl.

## 2022-09-05 NOTE — Patient Instructions (Addendum)
Go to the lab on the way out.   If you have mychart we'll likely use that to update you.    Take care.  Glad to see you. Try adding on flonase and see if that helps the cough.   Plan on recheck in about 6 months.  We can do labs at the visit.

## 2022-09-06 DIAGNOSIS — R059 Cough, unspecified: Secondary | ICD-10-CM | POA: Insufficient documentation

## 2022-09-06 NOTE — Assessment & Plan Note (Signed)
Advance directive d/w pt. Daughter Anita or Tammy to make health care decisions if needed.  

## 2022-09-06 NOTE — Assessment & Plan Note (Signed)
Not yet due for follow-up echo.

## 2022-09-06 NOTE — Assessment & Plan Note (Signed)
Advised to try adding on flonase and see if that helps the cough.

## 2022-09-06 NOTE — Assessment & Plan Note (Signed)
On replacement.  Continue.  See notes on labs.

## 2022-09-06 NOTE — Assessment & Plan Note (Signed)
Has slept better recently.  Update me as needed.

## 2022-09-06 NOTE — Assessment & Plan Note (Signed)
Mood d/w pt.  "Pretty good."  No ADE on med sertraline.  "I'm doing all right."  Continue sertraline as is.

## 2022-09-06 NOTE — Assessment & Plan Note (Signed)
Continue losartan doxazosin hydrochlorothiazide.  See notes on labs.

## 2022-09-30 DIAGNOSIS — M720 Palmar fascial fibromatosis [Dupuytren]: Secondary | ICD-10-CM | POA: Diagnosis not present

## 2022-10-08 ENCOUNTER — Ambulatory Visit (INDEPENDENT_AMBULATORY_CARE_PROVIDER_SITE_OTHER): Payer: Medicare PPO

## 2022-10-08 DIAGNOSIS — E538 Deficiency of other specified B group vitamins: Secondary | ICD-10-CM | POA: Diagnosis not present

## 2022-10-08 MED ORDER — CYANOCOBALAMIN 1000 MCG/ML IJ SOLN
1000.0000 ug | Freq: Once | INTRAMUSCULAR | Status: AC
  Administered 2022-10-08: 1000 ug via INTRAMUSCULAR

## 2022-10-08 NOTE — Progress Notes (Signed)
Per orders of Dr. Crawford Givens, injection of Vitamin B 12 given in right deltoid given by Lewanda Rife. Patient tolerated injection well. Pt gets monthly B 12 injections.

## 2022-10-30 DIAGNOSIS — E669 Obesity, unspecified: Secondary | ICD-10-CM | POA: Diagnosis not present

## 2022-10-30 DIAGNOSIS — M1990 Unspecified osteoarthritis, unspecified site: Secondary | ICD-10-CM | POA: Diagnosis not present

## 2022-10-30 DIAGNOSIS — Z8249 Family history of ischemic heart disease and other diseases of the circulatory system: Secondary | ICD-10-CM | POA: Diagnosis not present

## 2022-10-30 DIAGNOSIS — N529 Male erectile dysfunction, unspecified: Secondary | ICD-10-CM | POA: Diagnosis not present

## 2022-10-30 DIAGNOSIS — F325 Major depressive disorder, single episode, in full remission: Secondary | ICD-10-CM | POA: Diagnosis not present

## 2022-10-30 DIAGNOSIS — I4430 Unspecified atrioventricular block: Secondary | ICD-10-CM | POA: Diagnosis not present

## 2022-10-30 DIAGNOSIS — R32 Unspecified urinary incontinence: Secondary | ICD-10-CM | POA: Diagnosis not present

## 2022-10-30 DIAGNOSIS — I10 Essential (primary) hypertension: Secondary | ICD-10-CM | POA: Diagnosis not present

## 2022-10-30 DIAGNOSIS — F419 Anxiety disorder, unspecified: Secondary | ICD-10-CM | POA: Diagnosis not present

## 2022-11-05 ENCOUNTER — Encounter: Payer: Self-pay | Admitting: Family Medicine

## 2022-11-05 ENCOUNTER — Ambulatory Visit: Payer: Medicare PPO | Admitting: Family Medicine

## 2022-11-05 VITALS — BP 104/60 | HR 74 | Temp 98.0°F | Ht 71.0 in | Wt 236.0 lb

## 2022-11-05 DIAGNOSIS — I77810 Thoracic aortic ectasia: Secondary | ICD-10-CM

## 2022-11-05 DIAGNOSIS — I10 Essential (primary) hypertension: Secondary | ICD-10-CM | POA: Diagnosis not present

## 2022-11-05 DIAGNOSIS — W57XXXA Bitten or stung by nonvenomous insect and other nonvenomous arthropods, initial encounter: Secondary | ICD-10-CM

## 2022-11-05 MED ORDER — LOSARTAN POTASSIUM 50 MG PO TABS: ORAL_TABLET | ORAL | Status: DC

## 2022-11-05 MED ORDER — DOXAZOSIN MESYLATE 4 MG PO TABS: 4.0000 mg | ORAL_TABLET | Freq: Every day | ORAL | 3 refills | Status: DC

## 2022-11-05 NOTE — Progress Notes (Unsigned)
Lower BP noted.  Checked multiple times at home.   Stream is good.  BP has usually been 120s or lower/50-60s.  Pulse usually 60-80.  Not lightheaded.  No exertional CP.  Some occ BLE edema.    Recheck tick bite, removed.  This was about 1 week ago.  No FCNAVD.  Discussed alpha gal but that is a separate issue.  No allergic sx.    Current Outpatient Medications on File Prior to Visit  Medication Sig Dispense Refill   cyanocobalamin (,VITAMIN B-12,) 1000 MCG/ML injection IM every 30 days 1 mL    doxazosin (CARDURA) 4 MG tablet Take 1 tablet (4 mg total) by mouth daily. 90 tablet 3   fluticasone (FLONASE) 50 MCG/ACT nasal spray Place 2 sprays into both nostrils daily. 16 g 6   hydrochlorothiazide (MICROZIDE) 12.5 MG capsule TAKE 1 CAPSULE (12.5 MG TOTAL) BY MOUTH DAILY. 90 capsule 3   losartan (COZAAR) 50 MG tablet Take 0.5 tablets (25 mg total) by mouth daily.     sertraline (ZOLOFT) 50 MG tablet Take 1 tablet (50 mg total) by mouth daily. 90 tablet 3   No current facility-administered medications on file prior to visit.   Meds, vitals, and allergies reviewed.   ROS: Per HPI unless specifically indicated in ROS section   Trace BLE edema.   Minimal irritation at tick bite site on L buttock.  No rash o/w.     He needs order for recheck echo re: aortic root in 04/2023.  Need to send staff message for 03/04/23.

## 2022-11-05 NOTE — Patient Instructions (Signed)
Stop losartan and check your BP next week.  If persistently above 140/90, then let me know.  Take care.  Glad to see you.

## 2022-11-07 DIAGNOSIS — W57XXXA Bitten or stung by nonvenomous insect and other nonvenomous arthropods, initial encounter: Secondary | ICD-10-CM | POA: Insufficient documentation

## 2022-11-07 NOTE — Assessment & Plan Note (Signed)
Reassuring exam, no systemic symptoms.  See above.  Update me as needed.

## 2022-11-07 NOTE — Assessment & Plan Note (Signed)
Stop losartan and check BP next week at home.  If persistently above 140/90, then let me know.

## 2022-11-07 NOTE — Assessment & Plan Note (Signed)
Not yet due for follow-up echo.  Discussed.

## 2022-11-12 ENCOUNTER — Ambulatory Visit (INDEPENDENT_AMBULATORY_CARE_PROVIDER_SITE_OTHER): Payer: Medicare PPO | Admitting: *Deleted

## 2022-11-12 DIAGNOSIS — E538 Deficiency of other specified B group vitamins: Secondary | ICD-10-CM | POA: Diagnosis not present

## 2022-11-12 MED ORDER — CYANOCOBALAMIN 1000 MCG/ML IJ SOLN
1000.0000 ug | Freq: Once | INTRAMUSCULAR | Status: AC
Start: 2022-11-12 — End: 2022-11-12
  Administered 2022-11-12: 1000 ug via INTRAMUSCULAR

## 2022-11-12 NOTE — Progress Notes (Signed)
Per orders of Dr. Duncan, injection of Vitamin B-12 given by Erienne Spelman. Patient tolerated injection well. 

## 2022-12-13 DIAGNOSIS — D225 Melanocytic nevi of trunk: Secondary | ICD-10-CM | POA: Diagnosis not present

## 2022-12-13 DIAGNOSIS — L720 Epidermal cyst: Secondary | ICD-10-CM | POA: Diagnosis not present

## 2022-12-13 DIAGNOSIS — L821 Other seborrheic keratosis: Secondary | ICD-10-CM | POA: Diagnosis not present

## 2022-12-17 ENCOUNTER — Encounter: Payer: Self-pay | Admitting: Family Medicine

## 2022-12-17 ENCOUNTER — Ambulatory Visit: Payer: Medicare PPO

## 2022-12-17 DIAGNOSIS — E538 Deficiency of other specified B group vitamins: Secondary | ICD-10-CM

## 2022-12-17 MED ORDER — CYANOCOBALAMIN 1000 MCG/ML IJ SOLN
1000.0000 ug | Freq: Once | INTRAMUSCULAR | Status: AC
Start: 2022-12-17 — End: 2022-12-17
  Administered 2022-12-17: 1000 ug via INTRAMUSCULAR

## 2022-12-17 NOTE — Progress Notes (Signed)
 Per orders of Dr. Crawford Givens, injection of Vitamin B-12 given by Melina Copa in right deltoid. Patient tolerated injection well. Patient will make appointment for 1 month.

## 2022-12-17 NOTE — Telephone Encounter (Signed)
error 

## 2023-01-09 ENCOUNTER — Ambulatory Visit: Payer: Medicare PPO | Admitting: Family Medicine

## 2023-01-09 ENCOUNTER — Encounter: Payer: Self-pay | Admitting: Family Medicine

## 2023-01-09 VITALS — BP 108/70 | HR 67 | Temp 98.1°F | Ht 71.0 in | Wt 238.0 lb

## 2023-01-09 DIAGNOSIS — E538 Deficiency of other specified B group vitamins: Secondary | ICD-10-CM | POA: Diagnosis not present

## 2023-01-09 DIAGNOSIS — I10 Essential (primary) hypertension: Secondary | ICD-10-CM | POA: Diagnosis not present

## 2023-01-09 MED ORDER — SERTRALINE HCL 50 MG PO TABS: 50.0000 mg | ORAL_TABLET | Freq: Every day | ORAL | 3 refills | Status: DC

## 2023-01-09 NOTE — Patient Instructions (Addendum)
Don't change your meds for now.  If you have trouble with getting recurrently lightheaded, then stop hydrochlorothiazide and let me know.  Take care.  Glad to see you.  Ask the front about a B12 shot.

## 2023-01-09 NOTE — Progress Notes (Signed)
Minimal nocturia, ~1x but sometimes not at all.  He isn't lightheaded as much as prior.  He is cautious on standing. Still on doxazosin and d/w pt about use for BPH and HTN.  He is off losartan. Still on hydrochlorothiazide 12.5mg  daily.    D/w pt about scheduling his B12 shot.   Meds, vitals, and allergies reviewed.   ROS: Per HPI unless specifically indicated in ROS section   GEN: nad, alert and oriented HEENT: ncat NECK: supple w/o LA CV: rrr.  PULM: ctab, no inc wob ABD: soft, +bs EXT: no edema SKIN: well perfused.

## 2023-01-12 NOTE — Assessment & Plan Note (Signed)
Asked patient to check with front about scheduling his next B12 dose.

## 2023-01-12 NOTE — Assessment & Plan Note (Signed)
If having trouble with getting recurrently lightheaded, then stop hydrochlorothiazide and let me know.  Continue doxazosin as is for now.  He is off losartan.  Rationale for medication use discussed with patient.

## 2023-01-21 ENCOUNTER — Ambulatory Visit: Payer: Medicare PPO

## 2023-01-21 DIAGNOSIS — E538 Deficiency of other specified B group vitamins: Secondary | ICD-10-CM

## 2023-01-21 MED ORDER — CYANOCOBALAMIN 1000 MCG/ML IJ SOLN
1000.0000 ug | Freq: Once | INTRAMUSCULAR | Status: AC
Start: 2023-01-21 — End: 2023-01-21
  Administered 2023-01-21: 1000 ug via INTRAMUSCULAR

## 2023-01-21 NOTE — Progress Notes (Signed)
Per orders of Dr. Crawford Givens, injection of vfitamin b 12 given by Lewanda Rife in left deltoid. Patient tolerated injection well. Patient will make appointment for 1 month.

## 2023-02-24 DIAGNOSIS — N200 Calculus of kidney: Secondary | ICD-10-CM | POA: Diagnosis not present

## 2023-02-24 DIAGNOSIS — Z8551 Personal history of malignant neoplasm of bladder: Secondary | ICD-10-CM | POA: Diagnosis not present

## 2023-02-24 DIAGNOSIS — N401 Enlarged prostate with lower urinary tract symptoms: Secondary | ICD-10-CM | POA: Diagnosis not present

## 2023-02-24 DIAGNOSIS — R351 Nocturia: Secondary | ICD-10-CM | POA: Diagnosis not present

## 2023-02-25 ENCOUNTER — Ambulatory Visit (INDEPENDENT_AMBULATORY_CARE_PROVIDER_SITE_OTHER): Payer: Medicare PPO

## 2023-02-25 DIAGNOSIS — E538 Deficiency of other specified B group vitamins: Secondary | ICD-10-CM | POA: Diagnosis not present

## 2023-02-25 MED ORDER — CYANOCOBALAMIN 1000 MCG/ML IJ SOLN
1000.0000 ug | Freq: Once | INTRAMUSCULAR | Status: AC
Start: 2023-02-25 — End: 2023-02-25
  Administered 2023-02-25: 1000 ug via INTRAMUSCULAR

## 2023-02-25 NOTE — Progress Notes (Signed)
Per orders of Dr. Graham Duncan, injection of vitamin b 12 given by Jazmon Kos in right deltoid. Patient tolerated injection well. Patient will make appointment for 1 month.   

## 2023-03-09 ENCOUNTER — Telehealth: Payer: Self-pay | Admitting: Family Medicine

## 2023-03-09 DIAGNOSIS — I77810 Thoracic aortic ectasia: Secondary | ICD-10-CM

## 2023-03-09 NOTE — Telephone Encounter (Signed)
Please call patient.  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  I put in the order and he should get a call about scheduling.  Thanks.

## 2023-03-10 NOTE — Telephone Encounter (Signed)
Called patient and reviewed all information. Patient verbalized understanding. Will call if any further questions.  

## 2023-03-27 ENCOUNTER — Ambulatory Visit: Payer: Medicare PPO

## 2023-03-27 DIAGNOSIS — E538 Deficiency of other specified B group vitamins: Secondary | ICD-10-CM | POA: Diagnosis not present

## 2023-03-27 MED ORDER — CYANOCOBALAMIN 1000 MCG/ML IJ SOLN
1000.0000 ug | Freq: Once | INTRAMUSCULAR | Status: AC
Start: 2023-03-27 — End: 2023-03-27
  Administered 2023-03-27: 1000 ug via INTRAMUSCULAR

## 2023-03-27 NOTE — Progress Notes (Signed)
Per orders of Dr. Graham Duncan, injection of vitamin b 12 given by Rena Isley in left deltoid. Patient tolerated injection well. Patient will make appointment for 1 month.   

## 2023-04-10 ENCOUNTER — Telehealth: Payer: Self-pay | Admitting: Family Medicine

## 2023-04-10 NOTE — Telephone Encounter (Signed)
Pt called in and stated that he need a call back concerning the Ultrasound  for his heart pt stated no one has called him pt would like a call back as soon as possible

## 2023-04-11 ENCOUNTER — Encounter: Payer: Self-pay | Admitting: Family Medicine

## 2023-04-11 NOTE — Telephone Encounter (Signed)
Pt had an echocardiogram ordered 03/09/23.  Called because no one has reached out to schedule the test. Thank you for your help with this.

## 2023-04-11 NOTE — Telephone Encounter (Signed)
Msg sent to Richard King with [facility] to see if she is able to assist with scheduling this Echo

## 2023-04-16 NOTE — Telephone Encounter (Signed)
Noted. Thanks.

## 2023-04-28 ENCOUNTER — Ambulatory Visit (INDEPENDENT_AMBULATORY_CARE_PROVIDER_SITE_OTHER): Payer: Medicare PPO

## 2023-04-28 ENCOUNTER — Ambulatory Visit: Payer: Medicare PPO | Attending: Family Medicine

## 2023-04-28 DIAGNOSIS — E538 Deficiency of other specified B group vitamins: Secondary | ICD-10-CM | POA: Diagnosis not present

## 2023-04-28 DIAGNOSIS — R600 Localized edema: Secondary | ICD-10-CM

## 2023-04-28 DIAGNOSIS — I77810 Thoracic aortic ectasia: Secondary | ICD-10-CM

## 2023-04-28 LAB — ECHOCARDIOGRAM COMPLETE
AR max vel: 2.91 cm2
AV Area VTI: 2.79 cm2
AV Area mean vel: 2.82 cm2
AV Mean grad: 7 mm[Hg]
AV Peak grad: 13.5 mm[Hg]
Ao pk vel: 1.84 m/s
Area-P 1/2: 2.76 cm2
Calc EF: 59.9 %
P 1/2 time: 567 ms
S' Lateral: 3.2 cm
Single Plane A2C EF: 59 %
Single Plane A4C EF: 61.6 %

## 2023-04-28 MED ORDER — CYANOCOBALAMIN 1000 MCG/ML IJ SOLN
1000.0000 ug | Freq: Once | INTRAMUSCULAR | Status: AC
Start: 2023-04-28 — End: 2023-04-28
  Administered 2023-04-28: 1000 ug via INTRAMUSCULAR

## 2023-04-28 NOTE — Progress Notes (Signed)
Patient presented for B 12 injection given by Wendie Simmer, CMA to right deltoid, patient voiced no concerns nor showed any signs of distress during injection.

## 2023-05-06 ENCOUNTER — Ambulatory Visit: Payer: Medicare PPO | Admitting: Family Medicine

## 2023-05-06 ENCOUNTER — Encounter: Payer: Self-pay | Admitting: Family Medicine

## 2023-05-06 VITALS — BP 128/70 | HR 59 | Temp 98.1°F | Ht 71.0 in | Wt 243.8 lb

## 2023-05-06 DIAGNOSIS — I77810 Thoracic aortic ectasia: Secondary | ICD-10-CM

## 2023-05-06 DIAGNOSIS — J3489 Other specified disorders of nose and nasal sinuses: Secondary | ICD-10-CM | POA: Diagnosis not present

## 2023-05-06 MED ORDER — GUAIFENESIN ER 600 MG PO TB12: 600.0000 mg | ORAL_TABLET | Freq: Two times a day (BID) | ORAL | Status: DC | PRN

## 2023-05-06 NOTE — Progress Notes (Unsigned)
duration of symptoms: inc sx for the last few days.  Recently with ST and that is atypical for patient.   rhinorrhea:no anterior rhinorrhea.   congestion:no ear pain:no  cough: no cough but frequently clearing his throat.  myalgias: no No fevers.  No sick contacts.   Hasn't been using flonase recently.   He had frequently throat clearing at baseline, longstanding.  He doesn't feel profoundly unwell, today is some better than yesterday.    D/w pt about prev echo report and similar aortic diameter compared to last year.  Discussed potential yearly recheck.  Per HPI unless specifically indicated in ROS section   Meds, vitals, and allergies reviewed.   GEN: nad, alert and oriented HEENT: mucous membranes moist, TM w/o erythema, nasal epithelium injected, OP with cobblestoning, clear rhinorrhea with postnasal drip on exam NECK: supple w/o LA CV: rrr. PULM: ctab, no inc wob ABD: soft, +bs EXT: no edema

## 2023-05-06 NOTE — Patient Instructions (Signed)
Use flonase daily and add on plain mucinex.  Don't get mucinex D.    Take plain mucinex with plenty of water.  Update me as needed.  Take care.  Glad to see you.

## 2023-05-07 DIAGNOSIS — J3489 Other specified disorders of nose and nasal sinuses: Secondary | ICD-10-CM | POA: Insufficient documentation

## 2023-05-07 NOTE — Assessment & Plan Note (Signed)
D/w pt about prev echo report and similar aortic diameter compared to last year.  Discussed potential yearly recheck.

## 2023-05-07 NOTE — Assessment & Plan Note (Signed)
Lungs are clear.  Benign exam.  Okay for outpatient follow-up. Use flonase daily and add on plain mucinex.  Don't get mucinex D. Take plain mucinex with plenty of water.  Update me as needed.  He agrees to plan.

## 2023-05-17 DIAGNOSIS — M545 Low back pain, unspecified: Secondary | ICD-10-CM | POA: Diagnosis not present

## 2023-05-26 ENCOUNTER — Encounter: Payer: Self-pay | Admitting: Family Medicine

## 2023-05-26 ENCOUNTER — Ambulatory Visit (INDEPENDENT_AMBULATORY_CARE_PROVIDER_SITE_OTHER): Payer: Medicare PPO | Admitting: Family Medicine

## 2023-05-26 VITALS — BP 114/70 | HR 67 | Temp 97.8°F | Ht 71.0 in | Wt 240.6 lb

## 2023-05-26 DIAGNOSIS — M545 Low back pain, unspecified: Secondary | ICD-10-CM

## 2023-05-26 DIAGNOSIS — K219 Gastro-esophageal reflux disease without esophagitis: Secondary | ICD-10-CM

## 2023-05-26 MED ORDER — TIZANIDINE HCL 4 MG PO TABS: 2.0000 mg | ORAL_TABLET | Freq: Every evening | ORAL | 1 refills | Status: DC | PRN

## 2023-05-26 MED ORDER — TRAMADOL HCL 50 MG PO TABS: 25.0000 mg | ORAL_TABLET | Freq: Every evening | ORAL | 1 refills | Status: DC | PRN

## 2023-05-26 MED ORDER — OMEPRAZOLE 20 MG PO CPDR: 20.0000 mg | DELAYED_RELEASE_CAPSULE | Freq: Every day | ORAL | 1 refills | Status: DC | PRN

## 2023-05-26 NOTE — Progress Notes (Signed)
Back pain.  Seen at ortho, xray reportedly with degenerative changes but no fx.  Rx'd prednisone and methocarbamol.  He changed methocarbamol to leftover tizanidine and tramadol.  He slept better in the meantime.  No midline back pain but L buttock pain at baseline.  Sleeping in recliner at baseline, for years.  He isn't drowsy and hasn't fallen, cautions d/w pt.    GERD sx.  That predates the sx above.  Milk of magnesia helped but it caused diarrhea later.  Discussed options.  Meds, vitals, and allergies reviewed.   ROS: Per HPI unless specifically indicated in ROS section   Nad Ncat Neck supple, no LA Rrr Ctab Abd soft, not ttp Back nontender to palpation of midline. Skin well-perfused. Able to bear weight without gross weakness in the lower extremities.

## 2023-05-26 NOTE — Patient Instructions (Addendum)
Try taking 1/2 tab of tramadol and tizanidine if needed for pain. Sedation caution.    Check with the ortho clinic if not getting better in the meantime.   Try taking omeprazole if needed for heartburn.   Update me as needed.  Take care.  Glad to see you.

## 2023-05-29 ENCOUNTER — Ambulatory Visit: Payer: Medicare PPO

## 2023-05-29 DIAGNOSIS — E538 Deficiency of other specified B group vitamins: Secondary | ICD-10-CM | POA: Diagnosis not present

## 2023-05-29 MED ORDER — CYANOCOBALAMIN 1000 MCG/ML IJ SOLN
1000.0000 ug | Freq: Once | INTRAMUSCULAR | Status: AC
  Administered 2023-05-29: 1000 ug via INTRAMUSCULAR

## 2023-05-29 NOTE — Progress Notes (Signed)
Per orders of Dr. Crawford Givens who is out of office and Dr Roxy Manns who is in office, injection of vitamin b 12 given by Lewanda Rife in left deltoid. Patient tolerated injection well. Patient will make appointment for 1 month.

## 2023-05-30 DIAGNOSIS — K219 Gastro-esophageal reflux disease without esophagitis: Secondary | ICD-10-CM | POA: Insufficient documentation

## 2023-05-30 NOTE — Assessment & Plan Note (Signed)
Discussed options.  He can try taking 1/2 tab of tramadol and tizanidine if needed for pain. Sedation caution.    I asked him to check with the ortho clinic if not getting better in the meantime.

## 2023-05-30 NOTE — Assessment & Plan Note (Signed)
He can try taking omeprazole if needed for heartburn.

## 2023-06-05 DIAGNOSIS — M791 Myalgia, unspecified site: Secondary | ICD-10-CM | POA: Diagnosis not present

## 2023-06-05 DIAGNOSIS — M5451 Vertebrogenic low back pain: Secondary | ICD-10-CM | POA: Diagnosis not present

## 2023-06-12 DIAGNOSIS — M7918 Myalgia, other site: Secondary | ICD-10-CM | POA: Diagnosis not present

## 2023-06-12 DIAGNOSIS — M5459 Other low back pain: Secondary | ICD-10-CM | POA: Diagnosis not present

## 2023-06-25 DIAGNOSIS — M47894 Other spondylosis, thoracic region: Secondary | ICD-10-CM | POA: Diagnosis not present

## 2023-06-25 DIAGNOSIS — M47817 Spondylosis without myelopathy or radiculopathy, lumbosacral region: Secondary | ICD-10-CM | POA: Diagnosis not present

## 2023-06-25 DIAGNOSIS — M9902 Segmental and somatic dysfunction of thoracic region: Secondary | ICD-10-CM | POA: Diagnosis not present

## 2023-06-25 DIAGNOSIS — M9904 Segmental and somatic dysfunction of sacral region: Secondary | ICD-10-CM | POA: Diagnosis not present

## 2023-06-25 DIAGNOSIS — M9903 Segmental and somatic dysfunction of lumbar region: Secondary | ICD-10-CM | POA: Diagnosis not present

## 2023-06-25 DIAGNOSIS — M47816 Spondylosis without myelopathy or radiculopathy, lumbar region: Secondary | ICD-10-CM | POA: Diagnosis not present

## 2023-07-01 ENCOUNTER — Ambulatory Visit (INDEPENDENT_AMBULATORY_CARE_PROVIDER_SITE_OTHER): Payer: Medicare PPO

## 2023-07-01 DIAGNOSIS — E538 Deficiency of other specified B group vitamins: Secondary | ICD-10-CM | POA: Diagnosis not present

## 2023-07-01 MED ORDER — CYANOCOBALAMIN 1000 MCG/ML IJ SOLN
1000.0000 ug | Freq: Once | INTRAMUSCULAR | Status: AC
  Administered 2023-07-01: 1000 ug via INTRAMUSCULAR

## 2023-07-01 NOTE — Progress Notes (Signed)
Per orders of Dr. Crawford Givens who is out of office and Dr Eustaquio Boyden who is in office, injection of vitamin b 12 given by Lewanda Rife in right deltoid. Patient tolerated injection well. Patient will make appointment for 1 month.

## 2023-07-07 ENCOUNTER — Encounter: Payer: Self-pay | Admitting: Family Medicine

## 2023-07-07 DIAGNOSIS — M5451 Vertebrogenic low back pain: Secondary | ICD-10-CM | POA: Diagnosis not present

## 2023-07-09 ENCOUNTER — Ambulatory Visit: Payer: Self-pay | Admitting: Family Medicine

## 2023-07-09 MED ORDER — TRAMADOL HCL 50 MG PO TABS: 50.0000 mg | ORAL_TABLET | Freq: Four times a day (QID) | ORAL | 1 refills | Status: DC | PRN

## 2023-07-09 NOTE — Telephone Encounter (Signed)
 Copied from CRM [id number]. Topic: Clinical - Medication Question >> Jul 09, 2023  2:12 PM Drema MATSU wrote: Reason for CRM: Patient daughter Madelin  stated that patient  is about to run out of traMADol  (ULTRAM ) 50 MG tablet. She wants to know if Dr. Cleatus will be ok if patient can increase to dose to 2 every 6 hours to help with pain.

## 2023-07-09 NOTE — Telephone Encounter (Signed)
 Called back: no answer: left message: will attempt to reach  back out to pt.

## 2023-07-09 NOTE — Telephone Encounter (Signed)
 Copied from CRM [id number]. Topic: Clinical - Medication Question >> Jul 09, 2023  2:12 PM Drema MATSU wrote: Reason for CRM: Patient daughter Madelin  stated that patient  is about to run out of traMADol  (ULTRAM ) 50 MG tablet. She wants to know if Dr. Cleatus will be ok if patient can increase to dose to 2 every 6 hours to help with pain.   Chief Complaint: Medication change/renewal  Symptoms: Left hip pain Frequency: Constant  Disposition: [] ED /[] Urgent Care (no appt availability in office) / [] Appointment(In office/virtual)/ []  [place] [facility]/ [] Home Care/ [] Refused Recommended Disposition /[] [place] [facility]/ [x]  Follow-up with PCP Additional Notes: Patient states he was prescribed Tramadol  for his left hip pain. He states he was also prescribed Lorazepam  for when he got an MRI. He states that his pain was so bad he was unable to tolerate the MRI and would like another prescription so he can try again. Patient also states that his Tramadol  is not helping control his pain and wanted to know if the dose could be increased. He states he is currently taking 2 of his pills every 6 hours for pain and states he would need a 90 pill supply. Patient also reports he has some confusion about his medications but isn't sure which ones while on the call. I made an appointment for the patient Friday to discuss his pain as well as some clarification for his medications.    Reason for Disposition  Caller requesting a CONTROLLED substance prescription refill (e.g., narcotics, ADHD medicines)  Answer Assessment - Initial Assessment Questions 1. DRUG NAME: What medicine do you need to have refilled?     Tramadol  2. REFILLS REMAINING: How many refills are remaining? (Note: The label on the medicine or pill bottle will show how many refills are remaining. If there are no refills remaining, then a renewal may be needed.)     None 3. EXPIRATION DATE: What is the expiration date? (Note: The label  states when the prescription will expire, and thus can no longer be refilled.)     N/A 4. PRESCRIBING HCP: Who prescribed it? Reason: If prescribed by specialist, call should be referred to that group.     Dr. Cleatus  5. SYMPTOMS: Do you have any symptoms?     Left hip pain, same as before  Protocols used: Medication Refill and Renewal Call-A-AH

## 2023-07-09 NOTE — Telephone Encounter (Signed)
I sent short term tramadol rx with sedation caution and we can talk about his situation at the OV.

## 2023-07-10 NOTE — Telephone Encounter (Signed)
 Patient notified

## 2023-07-11 ENCOUNTER — Encounter: Payer: Self-pay | Admitting: Family Medicine

## 2023-07-11 ENCOUNTER — Ambulatory Visit: Payer: Medicare PPO | Admitting: Family Medicine

## 2023-07-11 VITALS — BP 134/70 | HR 85 | Temp 97.6°F | Ht 71.0 in | Wt 235.8 lb

## 2023-07-11 DIAGNOSIS — N400 Enlarged prostate without lower urinary tract symptoms: Secondary | ICD-10-CM

## 2023-07-11 DIAGNOSIS — I10 Essential (primary) hypertension: Secondary | ICD-10-CM

## 2023-07-11 DIAGNOSIS — M545 Low back pain, unspecified: Secondary | ICD-10-CM | POA: Diagnosis not present

## 2023-07-11 NOTE — Patient Instructions (Addendum)
 I would try taking the least amount of tramadol  possible to help the pain.  Try taking 1 tab at a time if needed.  MRI Monday.  Take care.  Glad to see you.

## 2023-07-11 NOTE — Progress Notes (Signed)
 He has MRI pending.  Per outside clinic.  D/w pt about potential spinal stenosis anatomy.  D/w pt about risk of sedation with tramadol .  D/w pt about med use and med list updated/verified.  Discussed cleaning out old med bottles with out of date meds, etc.  Med list updated.    Rationale for current meds, ie HTN vs BPH, d/w pt and daughter. No ADE on current meds.  Compliant.  Voiding had improved with doxazosin .  No CP.  Not SOB.   Meds, vitals, and allergies reviewed.   ROS: Per HPI unless specifically indicated in ROS section   Nad Ncat Neck supple, no LA Rrr Ctab Abd soft not ttp Skin well perfused.  A&O with mentation at baseline on brief testing.

## 2023-07-13 NOTE — Assessment & Plan Note (Signed)
Continue doxazosin

## 2023-07-13 NOTE — Assessment & Plan Note (Signed)
 I would try taking the least amount of tramadol  possible to help the pain.  Try taking 1 tab at a time if needed.  MRI Monday.  See above.

## 2023-07-13 NOTE — Assessment & Plan Note (Signed)
 Continue doxazosin  and hydrochlorothiazide .

## 2023-07-14 DIAGNOSIS — M5451 Vertebrogenic low back pain: Secondary | ICD-10-CM | POA: Diagnosis not present

## 2023-07-17 DIAGNOSIS — M5451 Vertebrogenic low back pain: Secondary | ICD-10-CM | POA: Diagnosis not present

## 2023-07-29 DIAGNOSIS — M5416 Radiculopathy, lumbar region: Secondary | ICD-10-CM | POA: Diagnosis not present

## 2023-07-30 ENCOUNTER — Other Ambulatory Visit: Payer: Self-pay | Admitting: Family Medicine

## 2023-07-30 NOTE — Telephone Encounter (Signed)
 Copied from CRM [id number]. Topic: Clinical - Medication Refill >> Jul 30, 2023  1:05 PM Isabell A wrote: Most Recent Primary Care Visit:  Provider: Joaquim Nam  Department: [facility]  Visit Type: ACUTE  Date: 07/11/2023  Medication: hydrochlorothiazide (MICROZIDE) 12.5 MG capsule   Has the patient contacted their pharmacy? Yes, prescription has expired.  (Agent: If no, request that the patient contact the pharmacy for the refill. If patient does not wish to contact the pharmacy document the reason why and proceed with request.) (Agent: If yes, when and what did the pharmacy advise?)  Is this the correct pharmacy for this prescription? Yes If no, delete pharmacy and type the correct one.  This is the patient's preferred pharmacy:  [facility] - [place], [place] - [address] [place] [address] Phone: [phone] Fax: [phone]   Has the prescription been filled recently? Yes  Is the patient out of the medication? No  Has the patient been seen for an appointment in the last year OR does the patient have an upcoming appointment? Yes  Can we respond through MyChart? No  Agent: Please be advised that Rx refills may take up to 3 business days. We ask that you follow-up with your pharmacy.

## 2023-08-05 ENCOUNTER — Ambulatory Visit (INDEPENDENT_AMBULATORY_CARE_PROVIDER_SITE_OTHER): Payer: Medicare PPO

## 2023-08-05 DIAGNOSIS — E538 Deficiency of other specified B group vitamins: Secondary | ICD-10-CM | POA: Diagnosis not present

## 2023-08-05 MED ORDER — CYANOCOBALAMIN 1000 MCG/ML IJ SOLN
1000.0000 ug | Freq: Once | INTRAMUSCULAR | Status: AC
  Administered 2023-08-05: 1000 ug via INTRAMUSCULAR

## 2023-08-05 NOTE — Progress Notes (Signed)
 Per orders of Dr. Crawford Givens, injection of vitamin b 12 given by Lewanda Rife in left deltoid. Patient tolerated injection well. Patient will make appointment for 1 month.

## 2023-08-06 DIAGNOSIS — M545 Low back pain, unspecified: Secondary | ICD-10-CM | POA: Diagnosis not present

## 2023-08-12 DIAGNOSIS — M51369 Other intervertebral disc degeneration, lumbar region without mention of lumbar back pain or lower extremity pain: Secondary | ICD-10-CM | POA: Diagnosis not present

## 2023-08-26 DIAGNOSIS — M5416 Radiculopathy, lumbar region: Secondary | ICD-10-CM | POA: Diagnosis not present

## 2023-08-26 DIAGNOSIS — M5451 Vertebrogenic low back pain: Secondary | ICD-10-CM | POA: Diagnosis not present

## 2023-09-01 ENCOUNTER — Ambulatory Visit: Payer: Self-pay | Admitting: *Deleted

## 2023-09-01 DIAGNOSIS — N200 Calculus of kidney: Secondary | ICD-10-CM | POA: Diagnosis not present

## 2023-09-01 DIAGNOSIS — R351 Nocturia: Secondary | ICD-10-CM | POA: Diagnosis not present

## 2023-09-01 DIAGNOSIS — N401 Enlarged prostate with lower urinary tract symptoms: Secondary | ICD-10-CM | POA: Diagnosis not present

## 2023-09-01 NOTE — Telephone Encounter (Signed)
Noted. Thanks. Will see at OV.  

## 2023-09-01 NOTE — Telephone Encounter (Signed)
 Copied from CRM [id number]. Topic: Clinical - Red Word Triage >> Sep 01, 2023  8:38 AM Pascal Lux wrote: Red Word that prompted transfer to Nurse Triage: patient stated his ankle is swollen. Reason for Disposition  SEVERE swelling (e.g., can't move swollen ankle at all)  Answer Assessment - Initial Assessment Questions 1. LOCATION: "Which ankle is swollen?" "Where is the swelling?"     Both of my ankles are swollen 2. ONSET: "When did the swelling start?"     Started about 3-4 weeks ago.   I think it's the medicine.   The swelling started when I got my medicine.   Hydrochlorothiazide.   I've been on it for several years.   The pill is always white.   This time it's blue.   The pharmacy changed suppliers.   So it's blue now. 3. SWELLING: "How bad is the swelling?" Or, "How large is it?" (e.g., mild, moderate, severe; size of localized swelling)    - NONE: No joint swelling.   - LOCALIZED: Localized; small area of puffy or swollen skin (e.g., insect bite, skin irritation).   - MILD: Joint looks or feels mildly swollen or puffy.   - MODERATE: Swollen; interferes with normal activities (e.g., work or school); decreased range of movement; may be limping.   - SEVERE: Very swollen; can't move swollen joint at all; limping a lot or unable to walk.     They are smooth they are so swollen coming up my leg, not up to my knees.    Shortness of breath is my normal.   Not any worse. 4. PAIN: "Is there any pain?" If Yes, ask: "How bad is it?" (Scale 1-10; or mild, moderate, severe)   - NONE (0): no pain.   - MILD (1-3): doesn't interfere with normal activities.    - MODERATE (4-7): interferes with normal activities (e.g., work or school) or awakens from sleep, limping.    - SEVERE (8-10): excruciating pain, unable to do any normal activities, unable to walk.      No pain 5. CAUSE: "What do you think caused the ankle swelling?"     The medicine.    6. OTHER SYMPTOMS: "Do you have any other symptoms?" (e.g.,  fever, chest pain, difficulty breathing, calf pain)     No 7. PREGNANCY: "Is there any chance you are pregnant?" "When was your last menstrual period?"     N/A  Protocols used: Ankle Swelling-A-AH  Chief Complaint: Bilateral ankle swelling Symptoms: Tight feeling in ankles Frequency: Started 3-4 weeks ago Pertinent Negatives: Patient denies shortness of breath or chest pain Disposition: [] ED /[] Urgent Care (no appt availability in office) / [x] Appointment(In office/virtual)/ []  [place] [facility]/ [] Home Care/ [] Refused Recommended Disposition /[] [place] [facility]/ []  Follow-up with PCP Additional Notes: Appt made with Dr. Para March for 09/02/2023 at 12:00

## 2023-09-02 ENCOUNTER — Ambulatory Visit: Admitting: Family Medicine

## 2023-09-02 VITALS — BP 132/74 | HR 66 | Temp 97.6°F | Ht 71.0 in | Wt 243.6 lb

## 2023-09-02 DIAGNOSIS — R0602 Shortness of breath: Secondary | ICD-10-CM

## 2023-09-02 DIAGNOSIS — R609 Edema, unspecified: Secondary | ICD-10-CM

## 2023-09-02 DIAGNOSIS — E538 Deficiency of other specified B group vitamins: Secondary | ICD-10-CM

## 2023-09-02 LAB — CBC WITH DIFFERENTIAL/PLATELET
Basophils Absolute: 0 10*3/uL (ref 0.0–0.1)
Basophils Relative: 0.5 % (ref 0.0–3.0)
Eosinophils Absolute: 0.2 10*3/uL (ref 0.0–0.7)
Eosinophils Relative: 2.5 % (ref 0.0–5.0)
HCT: 40.2 % (ref 39.0–52.0)
Hemoglobin: 13.3 g/dL (ref 13.0–17.0)
Lymphocytes Relative: 19.7 % (ref 12.0–46.0)
Lymphs Abs: 1.2 10*3/uL (ref 0.7–4.0)
MCHC: 33.2 g/dL (ref 30.0–36.0)
MCV: 95.9 fl (ref 78.0–100.0)
Monocytes Absolute: 0.8 10*3/uL (ref 0.1–1.0)
Monocytes Relative: 12.4 % — ABNORMAL HIGH (ref 3.0–12.0)
Neutro Abs: 4.1 10*3/uL (ref 1.4–7.7)
Neutrophils Relative %: 64.9 % (ref 43.0–77.0)
Platelets: 226 10*3/uL (ref 150.0–400.0)
RBC: 4.19 Mil/uL — ABNORMAL LOW (ref 4.22–5.81)
RDW: 13.8 % (ref 11.5–15.5)
WBC: 6.3 10*3/uL (ref 4.0–10.5)

## 2023-09-02 LAB — BRAIN NATRIURETIC PEPTIDE: Pro B Natriuretic peptide (BNP): 33 pg/mL (ref 0.0–100.0)

## 2023-09-02 LAB — COMPREHENSIVE METABOLIC PANEL WITH GFR
ALT: 15 U/L (ref 0–53)
AST: 18 U/L (ref 0–37)
Albumin: 4.2 g/dL (ref 3.5–5.2)
Alkaline Phosphatase: 75 U/L (ref 39–117)
BUN: 20 mg/dL (ref 6–23)
CO2: 30 meq/L (ref 19–32)
Calcium: 9.9 mg/dL (ref 8.4–10.5)
Chloride: 105 meq/L (ref 96–112)
Creatinine, Ser: 0.91 mg/dL (ref 0.40–1.50)
GFR: 78.1 mL/min (ref >60.00)
Glucose, Bld: 101 mg/dL — ABNORMAL HIGH (ref 70–99)
Potassium: 4.9 meq/L (ref 3.5–5.1)
Sodium: 141 meq/L (ref 135–145)
Total Bilirubin: 0.6 mg/dL (ref 0.2–1.2)
Total Protein: 6.8 g/dL (ref 6.0–8.3)

## 2023-09-02 LAB — VITAMIN B12: Vitamin B-12: 410 pg/mL (ref 211–911)

## 2023-09-02 MED ORDER — MELOXICAM 7.5 MG PO TABS: 7.5000 mg | ORAL_TABLET | Freq: Every day | ORAL | Status: DC | PRN

## 2023-09-02 MED ORDER — DIPHENHYDRAMINE HCL 12.5 MG PO CHEW
12.5000 mg | CHEWABLE_TABLET | Freq: Every evening | ORAL | Status: AC | PRN
Start: 2023-09-02 — End: ?

## 2023-09-02 NOTE — Patient Instructions (Signed)
 Go to the lab on the way out.   If you have mychart we'll likely use that to update you.     Stop meloxicam for now and see if the swelling gets better.   Cancel the B12 appointment tomorrow- let me get your labs back before scheduling your next shot.   Take care.  Glad to see you.

## 2023-09-02 NOTE — Progress Notes (Unsigned)
 B ankle swelling for the last month.  He had change in brand of hydrochlorothiazide about 1 month ago.  No ankle pain but some discomfort from the puffiness itself.  Swelling for the last month or so.  Not SOB unless sig exertion. No CP with exertion.  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  Had been taking meloxicam prn.   He had back injection, he is some better.  Still walking with a cane.    Is scheduled for B12 tomorrow, would defer that until his labs are resulted.   He had urology eval yesterday.  D/w pt.    Rrr Ctab 1+ BLE edema up to the mid shins.  Skin well perfused.

## 2023-09-03 NOTE — Assessment & Plan Note (Signed)
 Unclear if this is related to or exacerbated by meloxicam.  He can stop meloxicam for now and see if the swelling gets better.  See notes on labs, especially since he has some shortness of breath on exertion.  His lungs are clear and he is okay for outpatient follow-up.

## 2023-09-03 NOTE — Assessment & Plan Note (Signed)
 See notes on labs.

## 2023-09-04 ENCOUNTER — Ambulatory Visit

## 2023-09-09 ENCOUNTER — Ambulatory Visit (INDEPENDENT_AMBULATORY_CARE_PROVIDER_SITE_OTHER)

## 2023-09-09 DIAGNOSIS — E538 Deficiency of other specified B group vitamins: Secondary | ICD-10-CM | POA: Diagnosis not present

## 2023-09-09 MED ORDER — CYANOCOBALAMIN 1000 MCG/ML IJ SOLN
1000.0000 ug | Freq: Once | INTRAMUSCULAR | Status: AC
  Administered 2023-09-09: 1000 ug via INTRAMUSCULAR

## 2023-09-09 NOTE — Progress Notes (Signed)
 Per orders of Dr. Crawford Givens, injection of vitamin b 12 given by Lewanda Rife in right deltoid. Patient tolerated injection well. Patient will make appointment for 1 month.

## 2023-09-23 DIAGNOSIS — H353131 Nonexudative age-related macular degeneration, bilateral, early dry stage: Secondary | ICD-10-CM | POA: Diagnosis not present

## 2023-09-23 DIAGNOSIS — Z961 Presence of intraocular lens: Secondary | ICD-10-CM | POA: Diagnosis not present

## 2023-09-23 DIAGNOSIS — H5203 Hypermetropia, bilateral: Secondary | ICD-10-CM | POA: Diagnosis not present

## 2023-09-23 DIAGNOSIS — H47323 Drusen of optic disc, bilateral: Secondary | ICD-10-CM | POA: Diagnosis not present

## 2023-09-30 ENCOUNTER — Ambulatory Visit: Admitting: Family Medicine

## 2023-09-30 ENCOUNTER — Encounter: Payer: Self-pay | Admitting: Family Medicine

## 2023-09-30 VITALS — BP 142/74 | HR 82 | Temp 98.0°F | Ht 71.0 in | Wt 244.8 lb

## 2023-09-30 DIAGNOSIS — R609 Edema, unspecified: Secondary | ICD-10-CM

## 2023-09-30 MED ORDER — HYDROCHLOROTHIAZIDE 12.5 MG PO CAPS: 12.5000 mg | ORAL_CAPSULE | Freq: Every day | ORAL | Status: DC

## 2023-09-30 NOTE — Patient Instructions (Addendum)
 I would take an extra dose of hydrochlorothiazide  today.  See if the swelling is better.   If the swelling is clearly better, let me know so I can update your med at the pharmacy.   You may not need 2 pills of hydrochlorothiazide  every day. You may be able to control the swelling with alternating 1 vs 2 tabs.   If taking 2 hydrochlorothiazide  doesn't make much difference in the next few days, then cut back 1 tab and try using knee high compression stockings.    I would stay off meloxicam  for now.  Take care.  Glad to see you.

## 2023-09-30 NOTE — Progress Notes (Signed)
 BLE edema.  Prev d/w pt about stopping meloxicam . No change with stopping that.  Prev brand change with hydrochlorothiazide .  He got back to the old brand of hydrochlorothiazide  about 1 week ago.  Swelling got better today.  Not SOB. No fevers.  Not SOB supine.    Walking with cane, that helps with joint pain.   Meds, vitals, and allergies reviewed.   ROS: Per HPI unless specifically indicated in ROS section   GEN: nad, alert and oriented HEENT: ncat NECK: supple w/o LA CV: rrr.  PULM: ctab, no inc wob ABD: soft, +bs EXT: 1+ BLE edema SKIN: no acute rash Normal B DP pulses.   Prev BNP and echo noted.

## 2023-09-30 NOTE — Assessment & Plan Note (Signed)
 I would take an extra dose of hydrochlorothiazide  today.  See if the swelling is better.   If the swelling is clearly better, let me know so I can update your med at the pharmacy.   You may not need 2 pills of hydrochlorothiazide  every day. You may be able to control the swelling with alternating 1 vs 2 tabs.   If taking 2 hydrochlorothiazide  doesn't make much difference in the next few days, then cut back 1 tab and try using knee high compression stockings.    I would stay off meloxicam  for now.

## 2023-10-07 ENCOUNTER — Ambulatory Visit

## 2023-10-09 ENCOUNTER — Ambulatory Visit

## 2023-10-09 DIAGNOSIS — E538 Deficiency of other specified B group vitamins: Secondary | ICD-10-CM

## 2023-10-09 MED ORDER — CYANOCOBALAMIN 1000 MCG/ML IJ SOLN
1000.0000 ug | Freq: Once | INTRAMUSCULAR | Status: AC
Start: 2023-10-09 — End: 2023-10-09
  Administered 2023-10-09: 1000 ug via INTRAMUSCULAR

## 2023-10-09 NOTE — Progress Notes (Signed)
Per orders of Dr. Kerby Nora, injection of B-12 given by Nanci Pina in left deltoid. Patient tolerated injection well.

## 2023-10-10 ENCOUNTER — Telehealth: Payer: Self-pay

## 2023-10-10 NOTE — Telephone Encounter (Signed)
 Patient has been scheduled

## 2023-10-10 NOTE — Telephone Encounter (Signed)
 We need to recheck him next week.  Thanks.

## 2023-10-10 NOTE — Telephone Encounter (Signed)
 Copied from CRM [id number]. Topic: General - Other >> Oct 10, 2023 10:25 AM Richard King wrote: Reason for CRM: patient is calling in regarding his ankles still being swollen he would like a call back regarding this to see what can be done

## 2023-10-14 ENCOUNTER — Ambulatory Visit: Admitting: Family Medicine

## 2023-10-14 ENCOUNTER — Encounter: Payer: Self-pay | Admitting: Family Medicine

## 2023-10-14 VITALS — BP 122/68 | HR 65 | Temp 97.8°F | Ht 71.0 in | Wt 247.0 lb

## 2023-10-14 DIAGNOSIS — R609 Edema, unspecified: Secondary | ICD-10-CM

## 2023-10-14 DIAGNOSIS — R0602 Shortness of breath: Secondary | ICD-10-CM | POA: Diagnosis not present

## 2023-10-14 LAB — BASIC METABOLIC PANEL WITH GFR
BUN: 19 mg/dL (ref 6–23)
CO2: 28 meq/L (ref 19–32)
Calcium: 9.7 mg/dL (ref 8.4–10.5)
Chloride: 104 meq/L (ref 96–112)
Creatinine, Ser: 0.97 mg/dL (ref 0.40–1.50)
GFR: 72.28 mL/min (ref >60.00)
Glucose, Bld: 88 mg/dL (ref 70–99)
Potassium: 3.9 meq/L (ref 3.5–5.1)
Sodium: 140 meq/L (ref 135–145)

## 2023-10-14 LAB — BRAIN NATRIURETIC PEPTIDE: Pro B Natriuretic peptide (BNP): 27 pg/mL (ref 0.0–100.0)

## 2023-10-14 MED ORDER — HYDROCHLOROTHIAZIDE 12.5 MG PO CAPS: 12.5000 mg | ORAL_CAPSULE | Freq: Every day | ORAL | Status: DC

## 2023-10-14 NOTE — Patient Instructions (Addendum)
 We're working on getting the echo scheduled.  You should get a call about that.   Go to the lab on the way out.   If you have mychart we'll likely use that to update you.    Take care.  Glad to see you. Try compression stockings in the meantime.

## 2023-10-14 NOTE — Progress Notes (Unsigned)
 Taking doxazosin  4mg  and 12.5mg  hydrochlorothiazide .  Taking 25mg  hydrochlorothiazide  didn't help much.    He hasn't tried compression stockings yet.    He has recent BNP that is normal.  Some SOBOE.  Not SOB when sleeping but sleeping in recliner.  Still having BLE edema.  No CP.    1+ BLE

## 2023-10-15 NOTE — Assessment & Plan Note (Signed)
 In spite of taking hydrochlorothiazide .  Some shortness of breath on exertion.  Lungs are clear.  Discussed options. Recheck labs today. Try compression stockings in the meantime.  Echo pending.  Reasonable to repeat.  At this point still okay for outpatient follow-up.  He agrees to plan.

## 2023-10-20 ENCOUNTER — Ambulatory Visit: Payer: Self-pay | Admitting: Family Medicine

## 2023-10-30 NOTE — Telephone Encounter (Signed)
 Copied from CRM [id number]. Topic: Appointments - Appointment Scheduling >> Oct 30, 2023  8:45 AM Dimple Francis wrote: Reason for CRM: Patient needing to get scheduled for a echocardiogram, he has not heard from anyone on this

## 2023-11-03 ENCOUNTER — Ambulatory Visit: Attending: Family Medicine

## 2023-11-03 DIAGNOSIS — R0602 Shortness of breath: Secondary | ICD-10-CM | POA: Diagnosis not present

## 2023-11-03 DIAGNOSIS — R600 Localized edema: Secondary | ICD-10-CM

## 2023-11-03 LAB — ECHOCARDIOGRAM COMPLETE
AV Mean grad: 4 mmHg
AV Peak grad: 8.3 mmHg
Ao pk vel: 1.44 m/s
Area-P 1/2: 2.99 cm2

## 2023-11-04 ENCOUNTER — Ambulatory Visit (INDEPENDENT_AMBULATORY_CARE_PROVIDER_SITE_OTHER): Payer: Medicare PPO

## 2023-11-04 VITALS — BP 112/68 | Ht 71.0 in | Wt 243.4 lb

## 2023-11-04 DIAGNOSIS — Z0000 Encounter for general adult medical examination without abnormal findings: Secondary | ICD-10-CM

## 2023-11-04 NOTE — Patient Instructions (Signed)
 Richard King , Thank you for taking time out of your busy schedule to complete your Annual Wellness Visit with me. I enjoyed our conversation and look forward to speaking with you again next year. I, as well as your care team,  appreciate your ongoing commitment to your health goals. Please review the following plan we discussed and let me know if I can assist you in the future. Your Game plan/ To Do List     Follow up Visits: Next Medicare AWV with our clinical staff: 11/04/24 @ 1pm   Have you seen your provider in the last 6 months (3 months if uncontrolled diabetes)? Yes Next Office Visit with your provider: none:pt declines PCP PE appt at this time  Clinician Recommendations:  Aim for 30 minutes of exercise or brisk walking, 6-8 glasses of water , and 5 servings of fruits and vegetables each day.       This is a list of the screening recommended for you and due dates:  Health Maintenance  Topic Date Due   Zoster (Shingles) Vaccine (1 of 2) Never done   Colon Cancer Screening  10/11/2019   Flu Shot  01/02/2024   Medicare Annual Wellness Visit  11/03/2024   DTaP/Tdap/Td vaccine (3 - Tdap) 05/09/2029   Pneumonia Vaccine  Completed   HPV Vaccine  Aged Out   Meningitis B Vaccine  Aged Out   COVID-19 Vaccine  Discontinued    Advanced directives: (Declined) Advance directive discussed with you today. Even though you declined this today, please call our office should you change your mind, and we can give you the proper paperwork for you to fill out. Advance Care Planning is important because it:  [x]  Makes sure you receive the medical care that is consistent with your values, goals, and preferences  [x]  It provides guidance to your family and loved ones and reduces their decisional burden about whether or not they are making the right decisions based on your wishes.  Follow the link provided in your after visit summary or read over the paperwork we have mailed to you to help you started  getting your Advance Directives in place. If you need assistance in completing these, please reach out to us  so that we can help you!

## 2023-11-04 NOTE — Progress Notes (Cosign Needed)
 Subjective:   Richard King is a 83 y.o. who presents for a Medicare Wellness preventive visit.  As a reminder, Annual Wellness Visits don't include a physical exam, and some assessments may be limited, especially if this visit is performed virtually. We may recommend an in-person follow-up visit with your provider if needed.  Visit Complete: In person  VideoDeclined- This patient declined Interactive audio and Acupuncturist. Therefore the visit was completed with audio only.  Persons Participating in Visit: Patient.  AWV Questionnaire: No: Patient Medicare AWV questionnaire was not completed prior to this visit.  Cardiac Risk Factors include: advanced age (>58men, >39 women);hypertension;male gender;obesity (BMI >30kg/m2);sedentary lifestyle     Objective:     Today's Vitals   11/04/23 1246  BP: 112/68  Weight: 243 lb 6.4 oz (110.4 kg)  Height: 5\' 11"  (1.803 m)   Body mass index is 33.95 kg/m.     11/04/2023   12:58 PM 05/21/2022   11:01 AM 02/06/2021    6:04 AM 12/30/2019    6:00 PM 12/27/2019    2:18 PM 11/08/2019    1:04 PM 11/07/2015    9:15 AM  Advanced Directives  Does Patient Have a Medical Advance Directive? No No No Yes Yes No Yes  Type of [employer] Power of Attorney  Does patient want to make changes to medical advance directive?    No - Patient declined   No - Patient declined  Copy of Healthcare Power of Attorney in Chart?    No - copy requested No - copy requested  No - copy requested  Would patient like information on creating a medical advance directive?  No - Patient declined No - Patient declined   Yes (MAU/Ambulatory/Procedural Areas - Information given)     Current Medications (verified) Outpatient Encounter Medications as of 11/04/2023  Medication Sig   cyanocobalamin  (,VITAMIN B-12,) 1000 MCG/ML injection 1000mcg IM every 30 days   diphenhydrAMINE  (BENADRYL ) 12.5  MG chewable tablet Chew 1-2 tablets (12.5-25 mg total) by mouth at bedtime as needed for allergies.   doxazosin  (CARDURA ) 4 MG tablet Take 1 tablet (4 mg total) by mouth daily.   fluticasone  (FLONASE ) 50 MCG/ACT nasal spray Place 2 sprays into both nostrils daily.   hydrochlorothiazide  (MICROZIDE ) 12.5 MG capsule Take 1 capsule (12.5 mg total) by mouth daily.   sertraline  (ZOLOFT ) 50 MG tablet Take 1 tablet (50 mg total) by mouth daily.   traMADol  (ULTRAM ) 50 MG tablet Take 1-2 tablets (50-100 mg total) by mouth every 6 (six) hours as needed (sedation caution.). (Patient not taking: Reported on 11/04/2023)   No facility-administered encounter medications on file as of 11/04/2023.    Allergies (verified) Ivp dye [iodinated contrast media], Beta adrenergic blockers, Calcium channel blockers, Codeine, and Digoxin and related   History: Past Medical History:  Diagnosis Date   Aortic root dilatation (HCC)    folllowed by pcp---- echo in epic 04-04-2022   45mm   B12 deficiency    receiving monthly injections   Benign localized prostatic hyperplasia with lower urinary tract symptoms (LUTS)    urologist--- dr Annitta Kindler   BPPV (benign paroxysmal positional vertigo)    Complication of anesthesia    post op urinary retention 07/ 29/ 2021 surgery   COVID-19 05/31/2020   ED (erectile dysfunction)    History of bladder cancer    History of kidney stones    Hx of adenomatous colonic  polyps    Hx of echocardiogram 02/2021   in Epic   Hypertension    follows with Dr. Vallarie Gauze, PCP   OA (osteoarthritis)    shoulders and back   Recurrent malignant neoplasm of bladder [facility])    Past Surgical History:  Procedure Laterality Date   CATARACT EXTRACTION W/ INTRAOCULAR LENS IMPLANT Bilateral 2010   COLONOSCOPY     CYSTOSCOPY N/A 02/06/2021   Procedure: CYSTOSCOPY;  Surgeon: Sherlyn Ditto, MD;  Location: [facility];  Service: Urology;  Laterality: N/A;   CYSTOSCOPY W/ RETROGRADES  Bilateral 05/21/2022   Procedure: CYSTOSCOPY WITH RETROGRADE PYELOGRAM;  Surgeon: Sherlyn Ditto, MD;  [facility];  Service: Urology;  Laterality: Bilateral;   CYSTOSCOPY/URETEROSCOPY/HOLMIUM LASER/STENT PLACEMENT Right 12/15/2009   @[facility]    CYSTOSTOMY W/ BLADDER BIOPSY  10/01/2010   @[facility]    DOPPLER ECHOCARDIOGRAPHY  04/04/2022   EF 50 - 55%, moderate dilation of aortic root, 45 mm, mild mitral and aortic regurgitation, no stenosis   EXTRACORPOREAL SHOCK WAVE LITHOTRIPSY     2007;  07/ 2011   REVERSE SHOULDER ARTHROPLASTY Left 12/30/2019   Procedure: LEFT REVERSE SHOULDER ARTHROPLASTY;  Surgeon: Jasmine Mesi, MD;  Location: [facility] OR;  Service: Orthopedics;  Laterality: Left;   TONGUE SURGERY     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/09)---Dr [facility] RESECTION OF BLADDER TUMOR     01-03-2010 @[facility] ;  02-19-2010 @[facility]    TRANSURETHRAL RESECTION OF BLADDER TUMOR N/A 05/21/2022   Procedure: TRANSURETHRAL RESECTION OF BLADDER TUMOR (TURBT);  Surgeon: Sherlyn Ditto, MD;  Location: [facility];  Service: Urology;  Laterality: N/A;  30 MINS   TRANSURETHRAL RESECTION OF PROSTATE N/A 02/06/2021   Procedure: TRANSURETHRAL RESECTION OF THE PROSTATE (TURP), BLADDER LESION BIOPSY;  Surgeon: Sherlyn Ditto, MD;  Location: [facility];  Service: Urology;  Laterality: N/A;  1 HR   Family History  Problem Relation Age of Onset   Hypertension Father    AAA (abdominal aortic aneurysm) Father    Stroke Sister    Cancer Neg Hx    Diabetes Neg Hx    Colon cancer Neg Hx    Prostate cancer Neg Hx    Social History   Socioeconomic History   Marital status: Widowed    Spouse name: Not on file   Number of children: 3   Years of education: Not on file   Highest education level: 11th grade  Occupational History   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  Tobacco Use   Smoking status: Former    Current  packs/day: 0.00    Average packs/day: 2.0 packs/day for 20.0 years (40.0 ttl pk-yrs)    Types: Cigarettes    Start date: 43    Quit date: 1978    Years since quitting: 47.4   Smokeless tobacco: Never  Vaping Use   Vaping status: Never Used  Substance and Sexual Activity   Alcohol use: No    Alcohol/week: 0.0 standard drinks of alcohol   Drug use: Never   Sexual activity: Yes  Other Topics Concern   Not on file  Social History Narrative   Daughters Almira Armour or Tammy to make health care decisions   Would accept resuscitation but doesn't want prolonged interventions   Widowed 2022, married 1964   Social Drivers of Corporate investment banker Strain: Low Risk  (11/04/2023)   Overall Financial Resource Strain (CARDIA)    Difficulty of Paying Living Expenses:  Not hard at all  Food Insecurity: No Food Insecurity (11/04/2023)   Hunger Vital Sign    Worried About Running Out of Food in the Last Year: Never true    Ran Out of Food in the Last Year: Never true  Transportation Needs: No Transportation Needs (11/04/2023)   PRAPARE - Administrator, Civil Service (Medical): No    Lack of Transportation (Non-Medical): No  Physical Activity: Inactive (11/04/2023)   Exercise Vital Sign    Days of Exercise per Week: 0 days    Minutes of Exercise per Session: 0 min  Stress: No Stress Concern Present (11/04/2023)   [employer] of Occupational Health - Occupational Stress Questionnaire    Feeling of Stress : Only a little  Social Connections: Socially Isolated (11/04/2023)   Social Connection and Isolation Panel [NHANES]    Frequency of Communication with Friends and Family: More than three times a week    Frequency of Social Gatherings with Friends and Family: Never    Attends Religious Services: Never    Database administrator or Organizations: No    Attends Banker Meetings: Never    Marital Status: Widowed    Tobacco Counseling Counseling given: Not  Answered  Clinical Intake:  Pre-visit preparation completed: Yes  Pain : No/denies pain    BMI - recorded: 33.95 Nutritional Status: BMI > 30  Obese Nutritional Risks: None Diabetes: No  No results found for: "HGBA1C"   How often do you need to have someone help you when you read instructions, pamphlets, or other written materials from your doctor or pharmacy?: 1 - Never  Interpreter Needed?: No  Comments: lives alone Information entered by :: B.Twylah Bennetts,LPN   Activities of Daily Living     11/04/2023   12:59 PM  In your present state of health, do you have any difficulty performing the following activities:  Hearing? 0  Vision? 0  Difficulty concentrating or making decisions? 0  Walking or climbing stairs? 0  Dressing or bathing? 0  Doing errands, shopping? 0  Preparing Food and eating ? N  Using the Toilet? N  In the past six months, have you accidently leaked urine? N  Do you have problems with loss of bowel control? N  Managing your Medications? N  Managing your Finances? N  Housekeeping or managing your Housekeeping? N    Patient Care Team: Donnie Galea, MD as PCP - General (Family Medicine) Ann Barnacle, MD (Inactive) as Consulting Physician (Urology) Zula Hitch, MD as Consulting Physician (Ophthalmology)  I have updated your Care Teams any recent Medical Services you may have received from other providers in the past year.     Assessment:    This is a routine wellness examination for Richard King.  Hearing/Vision screen Hearing Screening - Comments:: Pt says he does not hear well;has hearing aids he says he has do not work Programmer, multimedia - Comments:: Pt says he uses readers only Dr Alessandra Hussar   Goals Addressed             This Visit's Progress    None   On track    11/04/23-Pt stated he still does not have any wellness goals at this time.        Depression Screen     11/04/2023   12:55 PM 01/09/2023   10:27 AM 11/05/2022    9:23 AM 09/05/2022     9:20 AM 02/28/2022   10:45 AM 11/09/2020    2:57 PM  10/25/2019    3:38 PM  PHQ 2/9 Scores  PHQ - 2 Score 1 0 0 0 0 0 0  PHQ- 9 Score  1 0 0  1     Fall Risk     11/04/2023   12:50 PM 01/09/2023   10:27 AM 11/05/2022    9:22 AM 09/05/2022    9:20 AM 07/10/2022   10:49 AM  Fall Risk   Falls in the past year? 1 0 0 0 0  Number falls in past yr: 1 0 0 0 0  Injury with Fall? 0 0 0 0 0  Risk for fall due to : Impaired balance/gait;History of fall(s) No Fall Risks No Fall Risks No Fall Risks No Fall Risks  Follow up Education provided;Falls prevention discussed Falls evaluation completed Falls evaluation completed Falls evaluation completed Falls evaluation completed    MEDICARE RISK AT HOME:  Medicare Risk at Home Any stairs in or around the home?: Yes If so, are there any without handrails?: Yes Home free of loose throw rugs in walkways, pet beds, electrical cords, etc?: Yes Adequate lighting in your home to reduce risk of falls?: Yes Life alert?: No Use of a cane, walker or w/c?: Yes Grab bars in the bathroom?: Yes Shower chair or bench in shower?: Yes Elevated toilet seat or a handicapped toilet?: Yes  TIMED UP AND GO:  Was the test performed?  Yes  Length of time to ambulate 10 feet: 15 sec Gait slow and steady with assistive device  Cognitive Function: 6CIT completed    11/07/2015    9:17 AM  MMSE - Mini Mental State Exam  Orientation to time 5  Orientation to Place 5  Registration 3  Attention/ Calculation 0  Recall 3  Language- name 2 objects 0  Language- repeat 1  Language- follow 3 step command 3  Language- read & follow direction 0  Write a sentence 0  Copy design 0  Total score 20        11/04/2023    1:01 PM  6CIT Screen  What Year? 0 points  What month? 0 points  What time? 0 points  Count back from 20 0 points  Months in reverse 0 points  Repeat phrase 0 points  Total Score 0 points    Immunizations Immunization History  Administered Date(s)  Administered   Fluad Quad(high Dose 65+) 02/22/2021, 02/28/2022   Influenza Split 05/16/2011, 04/17/2012   Influenza, High Dose Seasonal PF 03/19/2017   Influenza,inj,Quad PF,6+ Mos 02/25/2013, 05/18/2015, 04/14/2019   Influenza-Unspecified 04/03/2017, 04/03/2018   Pneumococcal Conjugate-13 11/04/2014   Pneumococcal Polysaccharide-23 09/07/2008   Td 09/07/2008, 05/10/2019    Screening Tests Health Maintenance  Topic Date Due   Zoster Vaccines- Shingrix  (1 of 2) Never done   Colonoscopy  10/11/2019   INFLUENZA VACCINE  01/02/2024   Medicare Annual Wellness (AWV)  11/03/2024   DTaP/Tdap/Td (3 - Tdap) 05/09/2029   Pneumonia Vaccine 1+ Years old  Completed   HPV VACCINES  Aged Out   Meningococcal B Vaccine  Aged Out   COVID-19 Vaccine  Discontinued    Health Maintenance  Health Maintenance Due  Topic Date Due   Zoster Vaccines- Shingrix  (1 of 2) Never done   Colonoscopy  10/11/2019   Health Maintenance Items Addressed: None at this time  Additional Screening:  Vision Screening: Recommended annual ophthalmology exams for early detection of glaucoma and other disorders of the eye. Would you like a referral to an eye doctor? No  Dental Screening: Recommended annual dental exams for proper oral hygiene  Community Resource Referral / Chronic Care Management: CRR required this visit?  No   CCM required this visit?  No  Plan:    I have personally reviewed and noted the following in the patient's chart:   Medical and social history Use of alcohol, tobacco or illicit drugs  Current medications and supplements including opioid prescriptions. Patient is not currently taking opioid prescriptions. Functional ability and status Nutritional status Physical activity Advanced directives List of other physicians Hospitalizations, surgeries, and ER visits in previous 12 months Vitals Screenings to include cognitive, depression, and falls Referrals and appointments  In  addition, I have reviewed and discussed with patient certain preventive protocols, quality metrics, and best practice recommendations. A written personalized care plan for preventive services as well as general preventive health recommendations were provided to patient.   Nerissa Bannister, LPN   06/08/1094   After Visit Summary: Printed and given to pt  Notes: Nothing significant to report at this time.

## 2023-11-05 ENCOUNTER — Other Ambulatory Visit: Payer: Self-pay | Admitting: Family Medicine

## 2023-11-05 DIAGNOSIS — R0602 Shortness of breath: Secondary | ICD-10-CM

## 2023-11-12 ENCOUNTER — Ambulatory Visit (INDEPENDENT_AMBULATORY_CARE_PROVIDER_SITE_OTHER)

## 2023-11-12 DIAGNOSIS — E538 Deficiency of other specified B group vitamins: Secondary | ICD-10-CM | POA: Diagnosis not present

## 2023-11-12 MED ORDER — CYANOCOBALAMIN 1000 MCG/ML IJ SOLN
1000.0000 ug | Freq: Once | INTRAMUSCULAR | Status: AC
  Administered 2023-11-12: 1000 ug via INTRAMUSCULAR

## 2023-11-12 NOTE — Progress Notes (Signed)
 Per orders of Dr. Emmette Harms is not in office and Dr Claire Crick who is in office injection of vitamin b 12 given by Claretha Crocker in right deltoid. Patient tolerated injection well. Patient will make appointment for 1 month.

## 2023-11-17 DIAGNOSIS — R0609 Other forms of dyspnea: Secondary | ICD-10-CM | POA: Diagnosis not present

## 2023-11-17 DIAGNOSIS — M150 Primary generalized (osteo)arthritis: Secondary | ICD-10-CM | POA: Diagnosis not present

## 2023-11-17 DIAGNOSIS — I10 Essential (primary) hypertension: Secondary | ICD-10-CM | POA: Diagnosis not present

## 2023-11-17 DIAGNOSIS — E782 Mixed hyperlipidemia: Secondary | ICD-10-CM | POA: Diagnosis not present

## 2023-12-10 DIAGNOSIS — B078 Other viral warts: Secondary | ICD-10-CM | POA: Diagnosis not present

## 2023-12-10 DIAGNOSIS — L820 Inflamed seborrheic keratosis: Secondary | ICD-10-CM | POA: Diagnosis not present

## 2023-12-10 DIAGNOSIS — D225 Melanocytic nevi of trunk: Secondary | ICD-10-CM | POA: Diagnosis not present

## 2023-12-16 ENCOUNTER — Ambulatory Visit (INDEPENDENT_AMBULATORY_CARE_PROVIDER_SITE_OTHER)

## 2023-12-16 DIAGNOSIS — E538 Deficiency of other specified B group vitamins: Secondary | ICD-10-CM | POA: Diagnosis not present

## 2023-12-16 MED ORDER — CYANOCOBALAMIN 1000 MCG/ML IJ SOLN
1000.0000 ug | Freq: Once | INTRAMUSCULAR | Status: AC
  Administered 2023-12-16: 1000 ug via INTRAMUSCULAR

## 2023-12-16 NOTE — Progress Notes (Signed)
Patient presented for B 12 injection given by Rhylan Kagel, CMA to left deltoid, patient voiced no concerns nor showed any signs of distress during injection.  

## 2023-12-17 ENCOUNTER — Other Ambulatory Visit: Payer: Self-pay | Admitting: Family Medicine

## 2023-12-24 ENCOUNTER — Ambulatory Visit: Admitting: Cardiology

## 2024-01-05 ENCOUNTER — Other Ambulatory Visit: Payer: Self-pay | Admitting: Family Medicine

## 2024-01-20 ENCOUNTER — Ambulatory Visit (INDEPENDENT_AMBULATORY_CARE_PROVIDER_SITE_OTHER)

## 2024-01-20 DIAGNOSIS — E538 Deficiency of other specified B group vitamins: Secondary | ICD-10-CM | POA: Diagnosis not present

## 2024-01-20 MED ORDER — CYANOCOBALAMIN 1000 MCG/ML IJ SOLN
1000.0000 ug | Freq: Once | INTRAMUSCULAR | Status: AC
  Administered 2024-01-20: 1000 ug via INTRAMUSCULAR

## 2024-01-20 NOTE — Progress Notes (Signed)
Per orders of Dr. Crawford Givens, injection of B-12 given by Melina Copa in right deltoid. Patient tolerated injection well. Patient will make appointment for 1 month.

## 2024-01-30 ENCOUNTER — Other Ambulatory Visit: Payer: Self-pay | Admitting: Family Medicine

## 2024-01-30 DIAGNOSIS — I10 Essential (primary) hypertension: Secondary | ICD-10-CM

## 2024-01-30 NOTE — Telephone Encounter (Signed)
 E-scribed refill.  Plz schedule annual exam and fasting labs for additional refills.

## 2024-02-08 ENCOUNTER — Other Ambulatory Visit: Payer: Self-pay | Admitting: Family Medicine

## 2024-02-08 DIAGNOSIS — I10 Essential (primary) hypertension: Secondary | ICD-10-CM

## 2024-02-08 DIAGNOSIS — E538 Deficiency of other specified B group vitamins: Secondary | ICD-10-CM

## 2024-02-19 ENCOUNTER — Other Ambulatory Visit (INDEPENDENT_AMBULATORY_CARE_PROVIDER_SITE_OTHER)

## 2024-02-19 DIAGNOSIS — I10 Essential (primary) hypertension: Secondary | ICD-10-CM | POA: Diagnosis not present

## 2024-02-19 DIAGNOSIS — E538 Deficiency of other specified B group vitamins: Secondary | ICD-10-CM | POA: Diagnosis not present

## 2024-02-19 LAB — CBC WITH DIFFERENTIAL/PLATELET
Basophils Absolute: 0 K/uL (ref 0.0–0.1)
Basophils Relative: 0.5 % (ref 0.0–3.0)
Eosinophils Absolute: 0.2 K/uL (ref 0.0–0.7)
Eosinophils Relative: 3.9 % (ref 0.0–5.0)
HCT: 40.2 % (ref 39.0–52.0)
Hemoglobin: 13.1 g/dL (ref 13.0–17.0)
Lymphocytes Relative: 28.9 % (ref 12.0–46.0)
Lymphs Abs: 1.4 K/uL (ref 0.7–4.0)
MCHC: 32.6 g/dL (ref 30.0–36.0)
MCV: 94.8 fl (ref 78.0–100.0)
Monocytes Absolute: 0.6 K/uL (ref 0.1–1.0)
Monocytes Relative: 12.1 % — ABNORMAL HIGH (ref 3.0–12.0)
Neutro Abs: 2.7 K/uL (ref 1.4–7.7)
Neutrophils Relative %: 54.6 % (ref 43.0–77.0)
Platelets: 207 K/uL (ref 150.0–400.0)
RBC: 4.23 Mil/uL (ref 4.22–5.81)
RDW: 14.1 % (ref 11.5–15.5)
WBC: 4.9 K/uL (ref 4.0–10.5)

## 2024-02-19 LAB — COMPREHENSIVE METABOLIC PANEL WITH GFR
ALT: 14 U/L (ref 0–53)
AST: 19 U/L (ref 0–37)
Albumin: 4.2 g/dL (ref 3.5–5.2)
Alkaline Phosphatase: 69 U/L (ref 39–117)
BUN: 17 mg/dL (ref 6–23)
CO2: 29 meq/L (ref 19–32)
Calcium: 9.8 mg/dL (ref 8.4–10.5)
Chloride: 105 meq/L (ref 96–112)
Creatinine, Ser: 1.06 mg/dL (ref 0.40–1.50)
GFR: 64.82 mL/min (ref >60.00)
Glucose, Bld: 96 mg/dL (ref 70–99)
Potassium: 4 meq/L (ref 3.5–5.1)
Sodium: 141 meq/L (ref 135–145)
Total Bilirubin: 0.6 mg/dL (ref 0.2–1.2)
Total Protein: 6.7 g/dL (ref 6.0–8.3)

## 2024-02-19 LAB — LIPID PANEL
Cholesterol: 165 mg/dL (ref 0–200)
HDL: 55.3 mg/dL (ref >39.00)
LDL Cholesterol: 94 mg/dL (ref 0–99)
NonHDL: 109.71
Total CHOL/HDL Ratio: 3
Triglycerides: 78 mg/dL (ref 0.0–149.0)
VLDL: 15.6 mg/dL (ref 0.0–40.0)

## 2024-02-19 LAB — VITAMIN B12: Vitamin B-12: 317 pg/mL (ref 211–911)

## 2024-02-20 ENCOUNTER — Ambulatory Visit: Attending: Cardiovascular Disease | Admitting: Cardiovascular Disease

## 2024-02-20 ENCOUNTER — Encounter: Payer: Self-pay | Admitting: Cardiovascular Disease

## 2024-02-20 VITALS — BP 140/80 | HR 55 | Ht 70.0 in | Wt 242.4 lb

## 2024-02-20 DIAGNOSIS — R0602 Shortness of breath: Secondary | ICD-10-CM

## 2024-02-20 DIAGNOSIS — I10 Essential (primary) hypertension: Secondary | ICD-10-CM

## 2024-02-20 DIAGNOSIS — Z91041 Radiographic dye allergy status: Secondary | ICD-10-CM | POA: Diagnosis not present

## 2024-02-20 DIAGNOSIS — R600 Localized edema: Secondary | ICD-10-CM

## 2024-02-20 DIAGNOSIS — I7121 Aneurysm of the ascending aorta, without rupture: Secondary | ICD-10-CM | POA: Diagnosis not present

## 2024-02-20 MED ORDER — DIPHENHYDRAMINE HCL 50 MG PO CAPS: ORAL_CAPSULE | ORAL | 0 refills | Status: DC

## 2024-02-20 MED ORDER — PREDNISONE 50 MG PO TABS: ORAL_TABLET | ORAL | 0 refills | Status: DC

## 2024-02-20 NOTE — Assessment & Plan Note (Signed)
 Blood pressure controlled with hydrochlorothiazide .  Not a candidate for beta-blocker with a resting heart rate of 55 bpm.

## 2024-02-20 NOTE — Patient Instructions (Addendum)
 Medication Instructions:  No medication changes were made at this visit. Continue current regimen.   If the patient has contrast allergy: Patient will need a prescription for Prednisone  and very clear instructions (as follows): Prednisone  50 mg - take 13 hours prior to test Take another Prednisone  50 mg 7 hours prior to test Take another Prednisone  50 mg 1 hour prior to test Take Benadryl  50 mg 1 hour prior to test Patient must complete all four doses of above prophylactic medications. Patient will need a ride after test due to Benadryl .  *If you need a refill on your cardiac medications before your next appointment, please call your pharmacy*  Lab Work: None ordered today. If you have labs (blood work) drawn today and your tests are completely normal, you will receive your results only by: MyChart Message (if you have MyChart) OR A paper copy in the mail If you have any lab test that is abnormal or we need to change your treatment, we will call you to review the results.  Testing/Procedures: You provider has requested that you have a CTA chest aorta. Cardiac CT Angiography (CTA), is a special type of CT scan that uses a computer to produce multi-dimensional views of major blood vessels throughout the body. In CT angiography, a contrast material is injected through an IV to help visualize the blood vessels   Follow-Up: At [facility], you and your health needs are our priority.  As part of our continuing mission to provide you with exceptional heart care, our providers are all part of one team.  This team includes your primary Cardiologist (physician) and Advanced Practice Providers or APPs (Physician Assistants and Nurse Practitioners) who all work together to provide you with the care you need, when you need it.  Your next appointment:   1 year(s)  Provider:   Ozell Fell, MD    We recommend signing up for the patient portal called MyChart.  Sign up information is  provided on this After Visit Summary.  MyChart is used to connect with patients for Virtual Visits (Telemedicine).  Patients are able to view lab/test results, encounter notes, upcoming appointments, etc.  Non-urgent messages can be sent to your provider as well.   To learn more about what you can do with MyChart, go to [email or web address].   Other Instructions  For your  leg edema you  should do  the following 1. Leg elevation - I recommend the Lounge Dr. Leg rest.  See below for details  2. Salt restriction  -  Use potassium chloride instead of regular salt as a salt substitute. 3. Walk regularly 4. Compression hose - Medical Supply store  5. Weight loss    Available on [email or web address] Or  Go to [email or web address]    .[email or web address]

## 2024-02-20 NOTE — Progress Notes (Signed)
 Cardiology Office Note:    Date:  02/20/2024   ID:  Richard King, DOB 16-Jun-1940, MRN [id number]  PCP:  Cleatus Arlyss RAMAN, MD   [facility] Providers Cardiologist:  None     Referring MD: Cleatus Arlyss RAMAN, MD   Chief Complaint  Patient presents with   Shortness of Breath    History of Present Illness:    Richard King is a 83 y.o. male referred by Dr. Cleatus for evaluation of shortness of breath.  An echocardiogram from June 2025 shows low normal LV EF of 50 to 55%, grade 1 diastolic dysfunction, normal RV function, no mitral regurgitation, and mild aortic regurgitation.  The aortic root is moderately dilated at 46 mm.  The patient is here with his daughter today. He reports a hx of abdominal aortic aneurysm in his father who was a smoker. The patient quit smoking in 1979.   The patient complains of leg swelling in his ankles and lower legs. He drives a car and is pretty active in his yard and is frequently out and running errands. He can walk for about 10 minutes before he has to rest.  He denies any exertional chest pain or pressure.  He has some problems with reflux and hiatal hernia.  He states that he gets food stuck sometimes and he has pain when it passes or when he drinks water  afterwards.  Problems but denies orthopnea or PND.  Current Medications: Current Meds  Medication Sig   cyanocobalamin  (,VITAMIN B-12,) 1000 MCG/ML injection 1000mcg IM every 30 days   diphenhydrAMINE  (BENADRYL ) 12.5 MG chewable tablet Chew 1-2 tablets (12.5-25 mg total) by mouth at bedtime as needed for allergies.   diphenhydrAMINE  (BENADRYL ) 50 MG capsule Take one capsule 1 hour prior to scan.   doxazosin  (CARDURA ) 4 MG tablet TAKE 1 TABLET BY MOUTH DAILY.   fluticasone  (FLONASE ) 50 MCG/ACT nasal spray Place 2 sprays into both nostrils daily.   hydrochlorothiazide  (MICROZIDE ) 12.5 MG capsule TAKE 1 CAPSULE BY MOUTH DAILY.   predniSONE  (DELTASONE ) 50 MG tablet Take one tablet 13 hours, 7  hours, and 1 hour prior to scan.   sertraline  (ZOLOFT ) 50 MG tablet TAKE 1 TABLET (50 MG TOTAL) BY MOUTH DAILY.   traMADol  (ULTRAM ) 50 MG tablet Take 1-2 tablets (50-100 mg total) by mouth every 6 (six) hours as needed (sedation caution.).     Allergies:   Ivp dye [iodinated contrast media], Beta adrenergic blockers, Calcium channel blockers, Codeine, and Digoxin and related   Past Medical History:  Diagnosis Date   Aortic root dilatation (HCC)    folllowed by pcp---- echo in epic 04-04-2022   45mm   B12 deficiency    receiving monthly injections   Benign localized prostatic hyperplasia with lower urinary tract symptoms (LUTS)    urologist--- dr rosalind   BPPV (benign paroxysmal positional vertigo)    Complication of anesthesia    post op urinary retention 07/ 29/ 2021 surgery   COVID-19 05/31/2020   ED (erectile dysfunction)    History of bladder cancer    History of kidney stones    Hx of adenomatous colonic polyps    Hx of echocardiogram 02/2021   in Epic   Hypertension    follows with Dr. Cleatus, PCP   OA (osteoarthritis)    shoulders and back   Recurrent malignant neoplasm of bladder [facility])    Past Surgical History:  Procedure Laterality Date   CATARACT EXTRACTION W/ INTRAOCULAR LENS IMPLANT Bilateral 2010  COLONOSCOPY     CYSTOSCOPY N/A 02/06/2021   Procedure: CYSTOSCOPY;  Surgeon: Rosalind Zachary NOVAK, MD;  Location: [facility];  Service: Urology;  Laterality: N/A;   CYSTOSCOPY W/ RETROGRADES Bilateral 05/21/2022   Procedure: CYSTOSCOPY WITH RETROGRADE PYELOGRAM;  Surgeon: Rosalind Zachary NOVAK, MD;  Location: [facility];  Service: Urology;  Laterality: Bilateral;   CYSTOSCOPY/URETEROSCOPY/HOLMIUM LASER/STENT PLACEMENT Right 12/15/2009   @[facility]    CYSTOSTOMY W/ BLADDER BIOPSY  10/01/2010   @[facility]    DOPPLER ECHOCARDIOGRAPHY  04/04/2022   EF 50 - 55%, moderate dilation of aortic root, 45 mm, mild mitral and aortic regurgitation, no stenosis    EXTRACORPOREAL SHOCK WAVE LITHOTRIPSY     2007;  07/ 2011   REVERSE SHOULDER ARTHROPLASTY Left 12/30/2019   Procedure: LEFT REVERSE SHOULDER ARTHROPLASTY;  Surgeon: Addie Cordella Hamilton, MD;  Location: [facility];  Service: Orthopedics;  Laterality: Left;   TONGUE SURGERY     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/09)---Dr [facility] RESECTION OF BLADDER TUMOR     01-03-2010 @[facility] ;  02-19-2010 @[facility]    TRANSURETHRAL RESECTION OF BLADDER TUMOR N/A 05/21/2022   Procedure: TRANSURETHRAL RESECTION OF BLADDER TUMOR (TURBT);  Surgeon: Rosalind Zachary NOVAK, MD;  Location: [facility];  Service: Urology;  Laterality: N/A;  30 MINS   TRANSURETHRAL RESECTION OF PROSTATE N/A 02/06/2021   Procedure: TRANSURETHRAL RESECTION OF THE PROSTATE (TURP), BLADDER LESION BIOPSY;  Surgeon: Rosalind Zachary NOVAK, MD;  Location: [facility];  Service: Urology;  Laterality: N/A;  1 HR    ROS:   Please see the history of present illness.    All other systems reviewed and are negative.  EKGs/Labs/Other Studies Reviewed:    The following studies were reviewed today: Cardiac Studies & Procedures   ______________________________________________________________________________________________     ECHOCARDIOGRAM  ECHOCARDIOGRAM COMPLETE 11/03/2023  Narrative ECHOCARDIOGRAM REPORT    Patient Name:   Richard King Date of Exam: 11/03/2023 Medical Rec #:  [id number]     Height:       71.0 in Accession #:    [id number]    Weight:       247.0 lb Date of Birth:  1941-03-18     BSA:          2.306 m Patient Age:    83 years      BP:           122/68 mmHg Patient Gender: M             HR:           74 bpm. Exam Location:  [facility]  Procedure: 2D Echo, Cardiac Doppler, Color Doppler and Strain Analysis (Both Spectral and Color Flow Doppler were utilized during procedure).  Indications:    R60.0 Lower extremity edema  History:        Patient has prior history of  Echocardiogram examinations, most recent 04/28/2023. Signs/Symptoms:Edema and Shortness of Breath; Risk Factors:Hypertension and Former Smoker.  Sonographer:    Doyal Point MHA, BS, RDCS Referring Phys: [id number]   Sonographer Comments: Suboptimal parasternal window, no subcostal window and patient is obese. Image acquisition challenging due to patient body habitus. IMPRESSIONS   1. Left ventricular ejection fraction, by estimation, is 50 to 55%. The left ventricle has low normal function. The left ventricle demonstrates global hypokinesis. Left ventricular diastolic parameters are consistent with Grade I diastolic dysfunction (impaired relaxation). 2. Right ventricular systolic function is normal. The right ventricular size is not  well visualized. 3. The mitral valve is normal in structure. No evidence of mitral valve regurgitation. 4. The aortic valve was not well visualized. Aortic valve regurgitation is mild. 5. Aortic dilatation noted. There is moderate dilatation of the aortic root, measuring 46 mm.  Comparison(s): This study shows decrease EF from prior (called 60-65%) Unable to lay on left side, pt sitting upright for exam, VERY TDS.  FINDINGS Left Ventricle: Left ventricular ejection fraction, by estimation, is 50 to 55%. The left ventricle has low normal function. The left ventricle demonstrates global hypokinesis. Global longitudinal strain performed but not reported based on interpreter judgement due to suboptimal tracking. The left ventricular internal cavity size was normal in size. There is no left ventricular hypertrophy. Left ventricular diastolic parameters are consistent with Grade I diastolic dysfunction (impaired relaxation).  Right Ventricle: The right ventricular size is not well visualized. No increase in right ventricular wall thickness. Right ventricular systolic function is normal.  Left Atrium: Left atrial size was normal in size.  Right  Atrium: Right atrial size was not well visualized.  Pericardium: There is no evidence of pericardial effusion.  Mitral Valve: The mitral valve is normal in structure. No evidence of mitral valve regurgitation.  Tricuspid Valve: The tricuspid valve is not well visualized. Tricuspid valve regurgitation is not demonstrated.  Aortic Valve: The aortic valve was not well visualized. Aortic valve regurgitation is mild. Aortic valve mean gradient measures 4.0 mmHg. Aortic valve peak gradient measures 8.3 mmHg.  Pulmonic Valve: The pulmonic valve was not well visualized. Pulmonic valve regurgitation is not visualized.  Aorta: Aortic dilatation noted. There is moderate dilatation of the aortic root, measuring 46 mm.  Venous: The inferior vena cava was not well visualized.  IAS/Shunts: No atrial level shunt detected by color flow Doppler.    Diastology LV e' medial:    4.90 cm/s LV E/e' medial:  12.4 LV e' lateral:   6.31 cm/s LV E/e' lateral: 9.7  3D Volume EF: 3D EF:        46 %  LEFT ATRIUM             Index LA Vol (A2C):   37.3 ml 16.17 ml/m LA Vol (A4C):   47.5 ml 20.59 ml/m LA Biplane Vol: 43.8 ml 18.99 ml/m AORTIC VALVE AV Vmax:           144.00 cm/s AV Vmean:          95.300 cm/s AV VTI:            0.294 m AV Peak Grad:      8.3 mmHg AV Mean Grad:      4.0 mmHg LVOT Vmax:         87.80 cm/s LVOT Vmean:        59.900 cm/s LVOT VTI:          0.176 m LVOT/AV VTI ratio: 0.60  AORTA Ao Sinus diam: 4.66 cm  MITRAL VALVE MV Area (PHT): 2.99 cm    SHUNTS MV Decel Time: 254 msec    Systemic VTI: 0.18 m MV E velocity: 60.90 cm/s MV A velocity: 92.90 cm/s MV E/A ratio:  0.66  Redell Cave MD Electronically signed by Redell Cave MD Signature Date/Time: 11/03/2023/2:28:52 PM    Final          ______________________________________________________________________________________________      EKG:   EKG Interpretation Date/Time:  Friday February 20 2024 14:58:09 EDT Ventricular Rate:  55 PR Interval:  280 QRS Duration:  100  QT Interval:  416 QTC Calculation: 397 R Axis:   -54  Text Interpretation: 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 When compared with ECG of 21-May-2022 11:29, No significant change was found Confirmed by Wonda Sharper [phone]) on 02/20/2024 3:01:22 PM    Recent Labs: 10/14/2023: Pro B Natriuretic peptide (BNP) 27.0 02/19/2024: ALT 14; BUN 17; Creatinine, Ser 1.06; Hemoglobin 13.1; Platelets 207.0; Potassium 4.0; Sodium 141  Recent Lipid Panel    Component Value Date/Time   CHOL 165 02/19/2024 0940   TRIG 78.0 02/19/2024 0940   HDL 55.30 02/19/2024 0940   CHOLHDL 3 02/19/2024 0940   VLDL 15.6 02/19/2024 0940   LDLCALC 94 02/19/2024 0940          Physical Exam:    VS:  BP (!) 140/80   Pulse (!) 55   Ht 5' 10 (1.778 m)   Wt 242 lb 6.4 oz (110 kg)   SpO2 97%   BMI 34.78 kg/m     Wt Readings from Last 3 Encounters:  02/20/24 242 lb 6.4 oz (110 kg)  11/04/23 243 lb 6.4 oz (110.4 kg)  10/14/23 247 lb (112 kg)     GEN:  Well nourished, well developed pleasant obese elderly male in no acute distress HEENT: Normal NECK: No JVD; No carotid bruits LYMPHATICS: No lymphadenopathy CARDIAC: RRR, no murmurs, rubs, gallops RESPIRATORY:  Clear to auscultation without rales, wheezing or rhonchi  ABDOMEN: Soft, non-tender, non-distended MUSCULOSKELETAL: 1+ bilateral ankle edema; No deformity  SKIN: Warm and dry NEUROLOGIC:  Alert and oriented x 3 PSYCHIATRIC:  Normal affect   Assessment & Plan Aneurysm of ascending aorta without rupture (HCC) Reviewed patient's echo demonstrating the dilated aortic root at 45 to 46 mm in maximal diameter.  I recommended the gated chest CTA to better assess his aortic anatomy and to accurately measure aortic size.  I discussed the natural history of aneurysm with the patient and his daughter today.  They understand that blood pressure  control and imaging surveillance are key components of treatment. Shortness of breath Suspect primarily related to obesity/deconditioning. Essential hypertension Blood pressure controlled with hydrochlorothiazide .  Not a candidate for beta-blocker with a resting heart rate of 55 bpm. Allergy to imaging contrast media Pretreat with prednisone  and Benadryl  per protocol Bilateral leg edema Patient with some signs of venous insufficiency and with his obesity I am sure that this is contributing as well.  No clinical evidence of heart failure with normal NT proBNP and echo demonstrating normal RV function with low normal LVEF of 50 to 55% and no major valvular disease.  He is already on a thiazide diuretic.  Discussed compression and leg elevation with him.  See below.      Medication Adjustments/Labs and Tests Ordered: Current medicines are reviewed at length with the patient today.  Concerns regarding medicines are outlined above.  Orders Placed This Encounter  Procedures   CT ANGIO CHEST AORTA W/CM & OR WO/CM   EKG 12-Lead   Meds ordered this encounter  Medications   predniSONE  (DELTASONE ) 50 MG tablet    Sig: Take one tablet 13 hours, 7 hours, and 1 hour prior to scan.    Dispense:  3 tablet    Refill:  0   diphenhydrAMINE  (BENADRYL ) 50 MG capsule    Sig: Take one capsule 1 hour prior to scan.    Dispense:  1 capsule    Refill:  0    Patient Instructions  Medication Instructions:  No medication changes were made at this visit. Continue current regimen.   If the patient has contrast allergy: Patient will need a prescription for Prednisone  and very clear instructions (as follows): Prednisone  50 mg - take 13 hours prior to test Take another Prednisone  50 mg 7 hours prior to test Take another Prednisone  50 mg 1 hour prior to test Take Benadryl  50 mg 1 hour prior to test Patient must complete all four doses of above prophylactic medications. Patient will need a ride after test due  to Benadryl .  *If you need a refill on your cardiac medications before your next appointment, please call your pharmacy*  Lab Work: None ordered today. If you have labs (blood work) drawn today and your tests are completely normal, you will receive your results only by: MyChart Message (if you have MyChart) OR A paper copy in the mail If you have any lab test that is abnormal or we need to change your treatment, we will call you to review the results.  Testing/Procedures: You provider has requested that you have a CTA chest aorta. Cardiac CT Angiography (CTA), is a special type of CT scan that uses a computer to produce multi-dimensional views of major blood vessels throughout the body. In CT angiography, a contrast material is injected through an IV to help visualize the blood vessels   Follow-Up: At [facility], you and your health needs are our priority.  As part of our continuing mission to provide you with exceptional heart care, our providers are all part of one team.  This team includes your primary Cardiologist (physician) and Advanced Practice Providers or APPs (Physician Assistants and Nurse Practitioners) who all work together to provide you with the care you need, when you need it.  Your next appointment:   1 year(s)  Provider:   Ozell Fell, MD    We recommend signing up for the patient portal called MyChart.  Sign up information is provided on this After Visit Summary.  MyChart is used to connect with patients for Virtual Visits (Telemedicine).  Patients are able to view lab/test results, encounter notes, upcoming appointments, etc.  Non-urgent messages can be sent to your provider as well.   To learn more about what you can do with MyChart, go to [email or web address].   Other Instructions  For your  leg edema you  should do  the following 1. Leg elevation - I recommend the Lounge Dr. Leg rest.  See below for details  2. Salt restriction  -  Use potassium  chloride instead of regular salt as a salt substitute. 3. Walk regularly 4. Compression hose - Medical Supply store  5. Weight loss    Available on [email or web address] Or  Go to [email or web address]    .[email or web address]   Signed, Ozell Fell, MD  02/20/2024 4:36 PM    [facility]

## 2024-02-22 ENCOUNTER — Ambulatory Visit: Payer: Self-pay | Admitting: Family Medicine

## 2024-02-24 ENCOUNTER — Ambulatory Visit

## 2024-02-25 ENCOUNTER — Ambulatory Visit (HOSPITAL_COMMUNITY)
Admission: RE | Admit: 2024-02-25 | Discharge: 2024-02-25 | Disposition: A | Discharge status: Home / Self Care | Source: Ambulatory Visit | Attending: Cardiovascular Disease | Admitting: Cardiovascular Disease

## 2024-02-25 DIAGNOSIS — I7121 Aneurysm of the ascending aorta, without rupture: Secondary | ICD-10-CM | POA: Diagnosis not present

## 2024-02-25 DIAGNOSIS — I2510 Atherosclerotic heart disease of native coronary artery without angina pectoris: Secondary | ICD-10-CM | POA: Diagnosis not present

## 2024-02-25 MED ORDER — IOHEXOL 350 MG/ML SOLN
75.0000 mL | Freq: Once | INTRAVENOUS | Status: AC | PRN
  Administered 2024-02-25: 75 mL via INTRAVENOUS

## 2024-02-26 ENCOUNTER — Ambulatory Visit (INDEPENDENT_AMBULATORY_CARE_PROVIDER_SITE_OTHER): Admitting: Family Medicine

## 2024-02-26 ENCOUNTER — Encounter: Payer: Self-pay | Admitting: Family Medicine

## 2024-02-26 VITALS — BP 144/78 | HR 63 | Temp 98.1°F | Ht 70.08 in | Wt 243.8 lb

## 2024-02-26 DIAGNOSIS — R4586 Emotional lability: Secondary | ICD-10-CM

## 2024-02-26 DIAGNOSIS — I10 Essential (primary) hypertension: Secondary | ICD-10-CM

## 2024-02-26 DIAGNOSIS — E538 Deficiency of other specified B group vitamins: Secondary | ICD-10-CM | POA: Diagnosis not present

## 2024-02-26 DIAGNOSIS — I77810 Thoracic aortic ectasia: Secondary | ICD-10-CM

## 2024-02-26 DIAGNOSIS — M2550 Pain in unspecified joint: Secondary | ICD-10-CM | POA: Diagnosis not present

## 2024-02-26 DIAGNOSIS — R42 Dizziness and giddiness: Secondary | ICD-10-CM

## 2024-02-26 DIAGNOSIS — Z7189 Other specified counseling: Secondary | ICD-10-CM

## 2024-02-26 DIAGNOSIS — Z0000 Encounter for general adult medical examination without abnormal findings: Secondary | ICD-10-CM

## 2024-02-26 MED ORDER — DOXAZOSIN MESYLATE 4 MG PO TABS
4.0000 mg | ORAL_TABLET | Freq: Every day | ORAL | 3 refills | Status: AC
Start: 2024-02-26 — End: ?

## 2024-02-26 MED ORDER — HYDROCHLOROTHIAZIDE 12.5 MG PO CAPS: 12.5000 mg | ORAL_CAPSULE | Freq: Every day | ORAL | 3 refills | Status: AC

## 2024-02-26 MED ORDER — CYANOCOBALAMIN 1000 MCG/ML IJ SOLN
1000.0000 ug | Freq: Once | INTRAMUSCULAR | Status: AC
  Administered 2024-02-26: 1000 ug via INTRAMUSCULAR

## 2024-02-26 NOTE — Progress Notes (Signed)
 Vaccines d/w pt, ie flu/RSV.  Encouraged routine vaccination. Colon screening not due.  PSA screening not due.  Advance directive d/w pt. Daughter Donzell or Madelin to make health care decisions if needed.   Hypertension:    Using medication without problems or lightheadedness: no Chest pain with exertion: no Edema: trace BLE in compression stockings, d/w pt about elevation and compression stockings.  Short of breath: none unless sig exertion.   Prev labs d/w pt.   B12 def. Due for dose today, done at office visit.  On replacement.  No ADE on med.    Mood d/w pt.  Mood is still good.  No SI/HI.  Compliant with sertraline .   He had vertigo with laying flat with correction sleeping with some elevation.  Declined ENT eval.    He had CT re: aorta per cardiology.  Result pending.    Joint pain.  Taking tramadol  prn but not recently, no recent flare. He is occ using a cane but not always.  His back and leg pain are better.  He had prev injection.  He declined offer of PT.   Meds, vitals, and allergies reviewed.   ROS: Per HPI unless specifically indicated in ROS section   GEN: nad, alert and oriented HEENT: mucous membranes moist NECK: supple w/o LA CV: rrr.  PULM: ctab, no inc wob ABD: soft, +bs EXT: Trace BLE edema in compression stockings. SKIN: Well-perfused

## 2024-02-26 NOTE — Patient Instructions (Signed)
 B12 shot today.  Ask the pharmacy about RSV and flu shot.  Take care.  Glad to see you.

## 2024-02-26 NOTE — Progress Notes (Signed)
 Per orders of Dr. Crawford Givens, injection of B-12 given by Leonor Liv in left deltoid. Patient tolerated injection well.

## 2024-02-29 DIAGNOSIS — Z0000 Encounter for general adult medical examination without abnormal findings: Secondary | ICD-10-CM | POA: Insufficient documentation

## 2024-02-29 DIAGNOSIS — M2550 Pain in unspecified joint: Secondary | ICD-10-CM | POA: Insufficient documentation

## 2024-02-29 NOTE — Assessment & Plan Note (Signed)
 Due for dose today, done at office visit.  On replacement.  No ADE on med.   Continue as is.

## 2024-02-29 NOTE — Assessment & Plan Note (Signed)
  He had CT re: aorta per cardiology.  Result pending.   I will defer.

## 2024-02-29 NOTE — Assessment & Plan Note (Signed)
 Mood is still good.  No SI/HI.  Compliant with sertraline .  Continue as is.

## 2024-02-29 NOTE — Assessment & Plan Note (Signed)
 Taking tramadol  prn but not recently, no recent flare. He is occ using a cane but not always.  His back and leg pain are better.  He had prev injection.  He declined offer of PT. he can update me as needed.

## 2024-02-29 NOTE — Assessment & Plan Note (Signed)
 He had vertigo with laying flat with correction sleeping with some elevation.  Declined ENT eval.   he can update me as needed.

## 2024-02-29 NOTE — Assessment & Plan Note (Signed)
Advance directive d/w pt. Daughter Anita or Tammy to make health care decisions if needed.  

## 2024-02-29 NOTE — Assessment & Plan Note (Signed)
 Vaccines d/w pt, ie flu/RSV.  Encouraged routine vaccination. Colon screening not due.  PSA screening not due.  Advance directive d/w pt. Daughter Donzell or Madelin to make health care decisions if needed.

## 2024-02-29 NOTE — Assessment & Plan Note (Signed)
 Continue hydrochlorothiazide .  Labs discussed with patient.  Continue compression stocking use.

## 2024-03-01 ENCOUNTER — Ambulatory Visit: Admitting: Family Medicine

## 2024-03-01 ENCOUNTER — Encounter: Payer: Self-pay | Admitting: Family Medicine

## 2024-03-01 ENCOUNTER — Ambulatory Visit: Payer: Self-pay

## 2024-03-01 VITALS — BP 120/70 | HR 78 | Temp 98.0°F | Ht 70.08 in | Wt 244.0 lb

## 2024-03-01 DIAGNOSIS — L509 Urticaria, unspecified: Secondary | ICD-10-CM | POA: Diagnosis not present

## 2024-03-01 MED ORDER — PREDNISONE 20 MG PO TABS: ORAL_TABLET | ORAL | 0 refills | Status: DC

## 2024-03-01 MED ORDER — TRIAMCINOLONE ACETONIDE 0.1 % EX CREA: 1.0000 | TOPICAL_CREAM | Freq: Two times a day (BID) | CUTANEOUS | 0 refills | Status: DC

## 2024-03-01 NOTE — Progress Notes (Signed)
 Richard Radel T. Monque Haggar, MD, CAQ Sports Medicine [facility] [address] [place], [address]  Phone: [phone]  FAX: [phone]  Richard King - 83 y.o. male  MRN [id number]  Date of Birth: 1940/06/30  Date: 03/01/2024  PCP: Cleatus Arlyss RAMAN, MD  Referral: Cleatus Arlyss RAMAN, MD  Chief Complaint  Patient presents with   Urticaria   Subjective:   Richard King is a 83 y.o. very pleasant male patient with Body mass index is 34.93 kg/m. who presents with the following:  Discussed the use of AI scribe software for clinical note transcription with the patient, who gave verbal consent to proceed.   History of Present Illness Richard King is an 83 year old male with a known IV contrast allergy who presents with a widespread itchy rash following a CT scan with contrast.  He developed an itchy rash on Thursday night, the day after receiving IV contrast for a CT scan on Wednesday afternoon. The rash is widespread, affecting his stomach, back, and underarms. His daughter noted that he was 'red all over' while washing his back.  He has been taking Benadryl  every four hours and using a cream, which provides some relief, but finds it difficult to apply the cream extensively due to the rash's widespread nature. He recalls having similar reactions in the past when consuming shellfish, but denies any recent shellfish intake, having only eaten salmon, which he has tolerated before.  Prior to the CT scan, he was given three prednisone , and one Benadryl  due to known IV contrast allergy. He mentions his daughter,     Review of Systems is noted in the HPI, as appropriate  Objective:   BP 120/70   Pulse 78   Temp 98 F (36.7 C) (Temporal)   Ht 5' 10.08 (1.78 m)   Wt 244 lb (110.7 kg)   SpO2 96%   BMI 34.93 kg/m   GEN: No acute distress; alert,appropriate. PULM: Breathing comfortably in no respiratory distress PSYCH: Normally  interactive.     Nontender  ABD: S, NT, ND, + BS, No rebound, No HSM   Laboratory and Imaging Data:  Assessment and Plan:     ICD-10-CM   1. Urticaria  L50.9      Assessment & Plan Urticaria likely due to IV contrast Acute urticaria with itching and rash likely due to recent IV contrast.  - Prescribed oral prednisone : 2 tablets for 4 days, then 1 tablet for 4 days. - Recommended over-the-counter Zyrtec. - Advised continuation of oral Benadryl  as needed, noting potential drowsiness. - Prescribed triamcinolone  cream for topical application.  Medication Management during today's office visit: Meds ordered this encounter  Medications   predniSONE  (DELTASONE ) 20 MG tablet    Sig: 2 tabs po for 4 days, then 1 tab po for 4 days    Dispense:  12 tablet    Refill:  0   triamcinolone  cream (KENALOG ) 0.1 %    Sig: Apply 1 Application topically 2 (two) times daily.    Dispense:  454 g    Refill:  0   There are no discontinued medications.  Orders placed today for conditions managed today: No orders of the defined types were placed in this encounter.   Disposition: No follow-ups on file.  [employer] One speech-to-text software was used for transcription in this dictation.  Possible transcriptional errors can occur using Animal nutritionist.   Signed,  Jacques DASEN. Drea Jurewicz, MD  Outpatient Encounter Medications as of 03/01/2024  Medication Sig   cyanocobalamin  (,VITAMIN B-12,) 1000 MCG/ML injection 1000mcg IM every 30 days   diphenhydrAMINE  (BENADRYL ) 12.5 MG chewable tablet Chew 1-2 tablets (12.5-25 mg total) by mouth at bedtime as needed for allergies.   doxazosin  (CARDURA ) 4 MG tablet Take 1 tablet (4 mg total) by mouth daily.   fluticasone  (FLONASE ) 50 MCG/ACT nasal spray Place 2 sprays into both nostrils daily.   hydrochlorothiazide  (MICROZIDE ) 12.5 MG capsule Take 1 capsule (12.5 mg total) by mouth daily.   predniSONE  (DELTASONE ) 20 MG tablet 2 tabs po for 4 days, then  1 tab po for 4 days   sertraline  (ZOLOFT ) 50 MG tablet TAKE 1 TABLET (50 MG TOTAL) BY MOUTH DAILY.   traMADol  (ULTRAM ) 50 MG tablet Take 1-2 tablets (50-100 mg total) by mouth every 6 (six) hours as needed (sedation caution.).   triamcinolone  cream (KENALOG ) 0.1 % Apply 1 Application topically 2 (two) times daily.   No facility-administered encounter medications on file as of 03/01/2024.

## 2024-03-01 NOTE — Telephone Encounter (Signed)
 FYI Only or Action Required?: FYI only for provider.  Patient was last seen in primary care on 02/26/2024 by Cleatus Arlyss RAMAN, MD.  Called Nurse Triage reporting Urticaria.  Symptoms began several days ago.  Interventions attempted: OTC medications: Hydrocortisone  cream, Benadryl .  Symptoms are: unchanged.  Triage Disposition: See Physician Within 24 Hours  Patient/caregiver understands and will follow disposition?: Yes                     Copied from CRM #[id number]. Topic: Clinical - Red Word Triage >> Mar 01, 2024  8:06 AM Franky GRADE wrote: Red Word that prompted transfer to Nurse Triage: Patient is experiencing an allergic reaction to contrast he had during a CT scan on Wednesday 02/25/2024. Patient has broken out into hives all over his body and itchiness. Reason for Disposition  [1] MODERATE-SEVERE hives (e.g.,hives interfere with normal activities or work) AND [2] not improved after taking antihistamine (e.g., cetirizine, fexofenadine, or loratadine) > 24 hours  Answer Assessment - Initial Assessment Questions 1. APPEARANCE: What does the rash look like?      big 2. LOCATION: Where is the rash located?      Stomach arms and lower back and underarm 3. NUMBER: How many hives are there?      lots 4. SIZE: How big are the hives? (e.g., inches, cm, compare to coins) Do they all look the same or do they vary in shape and size?      *No Answer* 5. ONSET: When did the hives begin? (e.g., hours or days ago)      Noticed on Friday 6. ITCHING: Does it itch? If Yes, ask: How bad is the itch?  (e.g., none, mild, moderate, severe)     moderate 7. RECURRENT PROBLEM: Have you had hives before? If Yes, ask: When was the last time? and What happened that time?      yes 8. TRIGGERS: Were you exposed to any new food, plant, cosmetic product or animal just before the hives began?     dye 9. OTHER SYMPTOMS: Do you have any other symptoms? (e.g., fever,  tongue swelling, difficulty breathing, abdomen pain)     no  Protocols used: Hives-A-AH

## 2024-03-01 NOTE — Patient Instructions (Addendum)
 Zyrtec (certrizine) over the counter once a day  Benadryl  50 mg, up to 4 times a day

## 2024-03-06 ENCOUNTER — Ambulatory Visit: Payer: Self-pay | Admitting: Cardiovascular Disease

## 2024-03-06 DIAGNOSIS — I7121 Aneurysm of the ascending aorta, without rupture: Secondary | ICD-10-CM

## 2024-03-12 ENCOUNTER — Ambulatory Visit: Admitting: Cardiovascular Disease

## 2024-03-30 ENCOUNTER — Ambulatory Visit (INDEPENDENT_AMBULATORY_CARE_PROVIDER_SITE_OTHER)

## 2024-03-30 DIAGNOSIS — E538 Deficiency of other specified B group vitamins: Secondary | ICD-10-CM

## 2024-03-30 MED ORDER — CYANOCOBALAMIN 1000 MCG/ML IJ SOLN
1000.0000 ug | Freq: Once | INTRAMUSCULAR | Status: AC
  Administered 2024-03-30: 1000 ug via INTRAMUSCULAR

## 2024-03-30 NOTE — Progress Notes (Signed)
 Per orders of Dr. Crawford Givens, injection of vitamin b 12 given by Lewanda Rife in right deltoid. Patient tolerated injection well. Patient will make appointment for 1 month.

## 2024-04-09 DIAGNOSIS — Z8249 Family history of ischemic heart disease and other diseases of the circulatory system: Secondary | ICD-10-CM | POA: Diagnosis not present

## 2024-04-09 DIAGNOSIS — E669 Obesity, unspecified: Secondary | ICD-10-CM | POA: Diagnosis not present

## 2024-04-09 DIAGNOSIS — E785 Hyperlipidemia, unspecified: Secondary | ICD-10-CM | POA: Diagnosis not present

## 2024-04-09 DIAGNOSIS — K219 Gastro-esophageal reflux disease without esophagitis: Secondary | ICD-10-CM | POA: Diagnosis not present

## 2024-04-09 DIAGNOSIS — I129 Hypertensive chronic kidney disease with stage 1 through stage 4 chronic kidney disease, or unspecified chronic kidney disease: Secondary | ICD-10-CM | POA: Diagnosis not present

## 2024-04-09 DIAGNOSIS — F325 Major depressive disorder, single episode, in full remission: Secondary | ICD-10-CM | POA: Diagnosis not present

## 2024-04-09 DIAGNOSIS — R32 Unspecified urinary incontinence: Secondary | ICD-10-CM | POA: Diagnosis not present

## 2024-04-09 DIAGNOSIS — I2510 Atherosclerotic heart disease of native coronary artery without angina pectoris: Secondary | ICD-10-CM | POA: Diagnosis not present

## 2024-04-09 DIAGNOSIS — N400 Enlarged prostate without lower urinary tract symptoms: Secondary | ICD-10-CM | POA: Diagnosis not present

## 2024-05-04 ENCOUNTER — Ambulatory Visit

## 2024-05-04 DIAGNOSIS — E538 Deficiency of other specified B group vitamins: Secondary | ICD-10-CM

## 2024-05-04 MED ORDER — CYANOCOBALAMIN 1000 MCG/ML IJ SOLN
1000.0000 ug | Freq: Once | INTRAMUSCULAR | Status: AC
  Administered 2024-05-04: 1000 ug via INTRAMUSCULAR

## 2024-05-04 NOTE — Progress Notes (Signed)
 Per orders of Dr. Arlyss Solian, injection of B-12 given by Delores No D in left Left deltoid Patient tolerated injection well. Patient will make appointment for 1 month.

## 2024-05-17 ENCOUNTER — Telehealth: Payer: Self-pay | Admitting: Family Medicine

## 2024-05-17 NOTE — Telephone Encounter (Signed)
 Type of forms received: DMV   Routed un:ILWRJW POOL   Paperwork received by : RANDALL     Individual made aware of 3-5 business day turn around (Y/N): Y   Form completed and patient made aware of charges(Y/N):Y       Form location:  PROVIDER'S FOLDER

## 2024-05-17 NOTE — Telephone Encounter (Signed)
 I'll work on the hard copy.  Thanks.

## 2024-05-20 ENCOUNTER — Telehealth: Payer: Self-pay | Admitting: Family Medicine

## 2024-05-20 NOTE — Telephone Encounter (Signed)
 Copied from CRM #[id number]. Topic: General - Other >> May 20, 2024 10:21 AM Donna BRAVO wrote: Reason for CRM: Patient asking for update on handicap placard for car. Read Note Verbatim:  Richard Arlyss RAMAN, MD    05/17/24  5:20 PM Note I'll work on the hard copy.  Thanks.   Patient needs to take this form to the [facility] by 06/01/24  Please call patient.   Patient was informed they will receive a call back by end of business day

## 2024-05-21 NOTE — Telephone Encounter (Signed)
 I prev signed this and handed it in.  Please notify pt.

## 2024-05-21 NOTE — Telephone Encounter (Signed)
 Informed patient placard forms are ready for pick up. Forms are in the front office. He will be here Monday 05/24/24.

## 2024-06-08 ENCOUNTER — Ambulatory Visit

## 2024-06-08 DIAGNOSIS — E538 Deficiency of other specified B group vitamins: Secondary | ICD-10-CM

## 2024-06-08 MED ORDER — CYANOCOBALAMIN 1000 MCG/ML IJ SOLN
1000.0000 ug | Freq: Once | INTRAMUSCULAR | Status: AC
  Administered 2024-06-08: 1000 ug via INTRAMUSCULAR

## 2024-06-08 NOTE — Progress Notes (Signed)
 Per orders of Dr. Crawford Givens, injection of vitamin b 12 given by Lewanda Rife in right deltoid. Patient tolerated injection well. Patient will make appointment for 1 month.

## 2024-06-22 ENCOUNTER — Ambulatory Visit: Admitting: Family Medicine

## 2024-06-22 ENCOUNTER — Ambulatory Visit: Payer: Self-pay

## 2024-06-22 ENCOUNTER — Encounter: Payer: Self-pay | Admitting: Family Medicine

## 2024-06-22 VITALS — BP 120/64 | HR 76 | Temp 97.9°F | Ht 70.0 in | Wt 250.0 lb

## 2024-06-22 DIAGNOSIS — M1712 Unilateral primary osteoarthritis, left knee: Secondary | ICD-10-CM | POA: Diagnosis not present

## 2024-06-22 DIAGNOSIS — J0100 Acute maxillary sinusitis, unspecified: Secondary | ICD-10-CM | POA: Diagnosis not present

## 2024-06-22 MED ORDER — AMOXICILLIN-POT CLAVULANATE 875-125 MG PO TABS: 1.0000 | ORAL_TABLET | Freq: Two times a day (BID) | ORAL | 0 refills | Status: DC

## 2024-06-22 MED ORDER — CELECOXIB 100 MG PO CAPS: 100.0000 mg | ORAL_CAPSULE | Freq: Two times a day (BID) | ORAL | 1 refills | Status: AC | PRN

## 2024-06-22 NOTE — Telephone Encounter (Signed)
 FYI Only or Action Required?: FYI only for provider: appointment scheduled on 1/20.  Patient was last seen in primary care on 03/01/2024 by Richard Mirza, MD.  Called Nurse Triage reporting Cough.  Symptoms began several days ago.  Interventions attempted: OTC medications: alka seltzer and tylneol PM .  Symptoms are: gradually worsening.  Triage Disposition: See Physician Within 24 Hours  Patient/caregiver understands and will follow disposition?: Yes  Message from [facility] sent at 06/22/2024  9:41 AM EST  Reason for Triage: Patient has a productive cough and coughing up discolored phlegm   Reason for Disposition  [1] Continuous (nonstop) coughing interferes with work or school AND [2] no improvement using cough treatment per Care Advice  Answer Assessment - Initial Assessment Questions Chronic cough since 2021- worsened since Sunday Right sinus congestion and soreness to the cheek. Constant coughing, white phelgm. Denies SOB , CP, Dizziness, Fever  Alkaseltzer and tylenol  PM- mild relief  Appt with PCP this afternoon 1/20 to assess. ED precautions understood.   1. ONSET: When did the cough begin?      Sunday  2. SEVERITY: How bad is the cough today?      Carrying a cup everywhere to spit into  3. SPUTUM: Describe the color of your sputum (e.g., none, dry cough; clear, white, yellow, green)     white 4. HEMOPTYSIS: Are you coughing up any blood? If Yes, ask: How much? (e.g., flecks, streaks, tablespoons, etc.)     Denies  5. DIFFICULTY BREATHING: Are you having difficulty breathing? If Yes, ask: How bad is it? (e.g., mild, moderate, severe)      Denies-  6. FEVER: Do you have a fever? If Yes, ask: What is your temperature, how was it measured, and when did it start?     Denies  7. CARDIAC HISTORY: Do you have any history of heart disease? (e.g., heart attack, congestive heart failure)      HTN  8. LUNG HISTORY: Do you have any history of lung  disease?  (e.g., pulmonary embolus, asthma, emphysema)     denies 9. PE RISK FACTORS: Do you have a history of blood clots? (or: recent major surgery, recent prolonged travel, bedridden)     denies 10. OTHER SYMPTOMS: Do you have any other symptoms? (e.g., runny nose, wheezing, chest pain)       Runny nose, sinus congestion on Right side  Protocols used: Cough - Acute Productive-A-AH

## 2024-06-22 NOTE — Progress Notes (Unsigned)
 Cough, ST, sinus pressure.  R max sinus area ttp.  No fevers.  No ear pain.  No vomiting, no diarrhea.  Sputum is white.  Sx worse recently but ongoing.   Prev took prednisone  for rash and his joint pain clearly improved temporarily.  Nsaid cautions d/w pt.    Meds, vitals, and allergies reviewed.   ROS: Per HPI unless specifically indicated in ROS section   GEN: nad, alert and oriented HEENT: mucous membranes moist, tm w/o erythema, nasal exam w/o erythema, clear discharge noted,  OP with cobblestoning NECK: supple w/o LA CV: rrr.   PULM: no wheeze but coarse BS B, o/w ctab, no inc wob EXT: no edema SKIN: well perfused.  R max sinus ttp.

## 2024-06-22 NOTE — Patient Instructions (Addendum)
 Try taking celebrex  daily if needed.  You could try taking it twice a day if needed.  Start augmentin , presumed sinus infection.  Rest and fluids.  Take care.  Glad to see you.

## 2024-06-23 DIAGNOSIS — J0100 Acute maxillary sinusitis, unspecified: Secondary | ICD-10-CM | POA: Insufficient documentation

## 2024-06-23 NOTE — Assessment & Plan Note (Signed)
 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  He clearly improved on prednisone .  Discussed options. He can try taking celebrex  daily if needed.  He could try taking it twice a day if needed.  Routine cautions discussed.

## 2024-06-23 NOTE — Assessment & Plan Note (Signed)
 Nontoxic.  Okay for outpatient follow-up. Start augmentin , presumed sinus infection.  Rest and fluids.

## 2024-06-24 ENCOUNTER — Encounter: Payer: Self-pay | Admitting: Family Medicine

## 2024-06-25 ENCOUNTER — Other Ambulatory Visit: Payer: Self-pay | Admitting: Family Medicine

## 2024-06-25 MED ORDER — HYDROCOD POLI-CHLORPHE POLI ER 10-8 MG/5ML PO SUER: 5.0000 mL | Freq: Two times a day (BID) | ORAL | 0 refills | Status: DC | PRN

## 2024-06-30 ENCOUNTER — Telehealth: Payer: Self-pay | Admitting: Family Medicine

## 2024-06-30 NOTE — Telephone Encounter (Signed)
 Please triage patient about his breathing.  Please see mychart message.  Thanks.

## 2024-07-01 ENCOUNTER — Ambulatory Visit: Payer: Self-pay

## 2024-07-01 NOTE — Telephone Encounter (Signed)
 FYI Only or Action Required?: FYI only for provider: appointment scheduled on 2/3.  Patient was last seen in primary care on 06/22/2024 by Cleatus Arlyss RAMAN, MD.  Called Nurse Triage reporting Cough.  Symptoms began pt states ongoing for long while.  Symptoms are: unchanged.  Triage Disposition: See HCP Within 4 Hours (Or PCP Triage)  Patient/caregiver understands and will follow disposition?: No, wishes to speak with PCP  Summary: short of breath, rattling in chest sometime,   Reason for Triage: cough is about the same, short of breath, rattling in chest sometime, sometimes wheeze when breathing.        Reason for Disposition  [1] MILD difficulty breathing (e.g., minimal/no SOB at rest, SOB with walking, pulse < 100) AND [2] NEW-onset or WORSE than normal  Answer Assessment - Initial Assessment Questions 1. RESPIRATORY STATUS: Describe your breathing? (e.g., wheezing, shortness of breath, unable to speak, severe coughing)      Coughing, SOB with exertion 2. ONSET: When did this breathing problem begin?      Ongoing for awhile, worse recently 3. PATTERN Does the difficult breathing come and go, or has it been constant since it started?      States with activity 4. SEVERITY: How bad is your breathing? (e.g., mild, moderate, severe)      mild 5. RECURRENT SYMPTOM: Have you had difficulty breathing before? If Yes, ask: When was the last time? and What happened that time?      denies 8. CAUSE: What do you think is causing the breathing problem?      Thought sinuses with PCP last week 9. OTHER SYMPTOMS: Do you have any other symptoms? (e.g., chest pain, cough, dizziness, fever, runny nose)     Cough,  Pt offered appts today with providers in [facility], pt declines, only wants to see PCP. Pt states that he was seen by PCP last week,  pt is not better nor worse than seeing PCP last week-per pt.  Protocols used: Breathing Difficulty-A-AH

## 2024-07-01 NOTE — Telephone Encounter (Signed)
 Next Appt With Family Medicine Frost Solian, MD) 07/05/2024 at 2:30 PM

## 2024-07-01 NOTE — Telephone Encounter (Signed)
 I spoke with Tammy (DPR signed) Tammy said the cough has been going on for a long time. Cough is productive with clear phlegm. No fever. Pt does get SOB with exertion but Tammy said pt has also gained weight. Pt has some wheezing and rattle in  chest on and off. Tammy said pt started using cough med and that does help pts coughing. Tammy said that pt is not in distress. Pt already has appt to see dr Cleatus on 07/05/24. Pt also has appt with ENT in [place] on 07/27/24. Pt wants a CXR. Tammy is going to ck with ENT in [place] to see if can get sooner appt., offered appt for pt sooner than 07/05/24 due to concern of pt condition and concern for possible snow over weekend. Tammy said pt will not see anyone at [facility] except Dr Cleatus.  Tammy said if pt condition worsened before appt on 07/05/24 she will take him to [facility] or ED. Sending note to Dr Cleatus and Cleatus pool and will teams Amy CMA.

## 2024-07-02 NOTE — Telephone Encounter (Signed)
 Noted. Thanks.

## 2024-07-02 NOTE — Telephone Encounter (Signed)
 See other triage note.  Thanks.

## 2024-07-05 ENCOUNTER — Ambulatory Visit: Admitting: Family Medicine

## 2024-07-08 ENCOUNTER — Ambulatory Visit

## 2024-07-08 ENCOUNTER — Ambulatory Visit: Admitting: Family Medicine

## 2024-07-08 ENCOUNTER — Ambulatory Visit
Admission: RE | Admit: 2024-07-08 | Discharge: 2024-07-08 | Disposition: A | Source: Ambulatory Visit | Attending: Family Medicine | Admitting: Family Medicine

## 2024-07-08 ENCOUNTER — Encounter: Payer: Self-pay | Admitting: Family Medicine

## 2024-07-08 VITALS — BP 122/78 | HR 64 | Temp 97.9°F | Ht 70.0 in | Wt 246.2 lb

## 2024-07-08 DIAGNOSIS — R0602 Shortness of breath: Secondary | ICD-10-CM

## 2024-07-08 DIAGNOSIS — E538 Deficiency of other specified B group vitamins: Secondary | ICD-10-CM

## 2024-07-08 DIAGNOSIS — R059 Cough, unspecified: Secondary | ICD-10-CM

## 2024-07-08 LAB — BRAIN NATRIURETIC PEPTIDE: Pro B Natriuretic peptide (BNP): 25 pg/mL (ref 1.0–100.0)

## 2024-07-08 LAB — CBC WITH DIFFERENTIAL/PLATELET
Basophils Absolute: 0 10*3/uL (ref 0.0–0.1)
Basophils Relative: 0.5 % (ref 0.0–3.0)
Eosinophils Absolute: 0.3 10*3/uL (ref 0.0–0.7)
Eosinophils Relative: 5.1 % — ABNORMAL HIGH (ref 0.0–5.0)
HCT: 39.3 % (ref 39.0–52.0)
Hemoglobin: 13.3 g/dL (ref 13.0–17.0)
Lymphocytes Relative: 27.4 % (ref 12.0–46.0)
Lymphs Abs: 1.4 10*3/uL (ref 0.7–4.0)
MCHC: 33.9 g/dL (ref 30.0–36.0)
MCV: 93.5 fl (ref 78.0–100.0)
Monocytes Absolute: 0.7 10*3/uL (ref 0.1–1.0)
Monocytes Relative: 12.9 % — ABNORMAL HIGH (ref 3.0–12.0)
Neutro Abs: 2.8 10*3/uL (ref 1.4–7.7)
Neutrophils Relative %: 54.1 % (ref 43.0–77.0)
Platelets: 196 10*3/uL (ref 150.0–400.0)
RBC: 4.21 Mil/uL — ABNORMAL LOW (ref 4.22–5.81)
RDW: 13.9 % (ref 11.5–15.5)
WBC: 5.1 10*3/uL (ref 4.0–10.5)

## 2024-07-08 MED ORDER — HYDROCOD POLI-CHLORPHE POLI ER 10-8 MG/5ML PO SUER: 5.0000 mL | Freq: Two times a day (BID) | ORAL | 0 refills | Status: AC | PRN

## 2024-07-08 MED ORDER — CYANOCOBALAMIN 1000 MCG/ML IJ SOLN
1000.0000 ug | Freq: Once | INTRAMUSCULAR | Status: AC
  Administered 2024-07-08: 1000 ug via INTRAMUSCULAR

## 2024-07-08 MED ORDER — ALBUTEROL SULFATE HFA 108 (90 BASE) MCG/ACT IN AERS: 1.0000 | INHALATION_SPRAY | Freq: Four times a day (QID) | RESPIRATORY_TRACT | 0 refills | Status: AC | PRN

## 2024-07-08 NOTE — Patient Instructions (Signed)
 Go to the lab on the way out.   If you have mychart we'll likely use that to update you.    Use the cough medicine as needed at night.  Finish amoxil  per dental clinic.  Use albuterol  if needed for cough or wheeze. See if that helps.  Take care.  Glad to see you.

## 2024-07-08 NOTE — Progress Notes (Unsigned)
 Cough.  Completed augmentin . On amoxil  currently.  Cough is better, but some last night.  Used nyquil last night.  Some occ sputum.  Some occ dry cough.  Some occ SOB, ie with sig exertion.   CT 9/20205.  No acute airspace disease. No pleural effusion. Mild bronchiolectasis in the medial right and lower lobes with adjacent scarring. No suspicious pulmonary nodule. Trachea and central airways are clear.    He could tolerate hydrocodone w/o ADE.  It helped cough.   Due for B12 shot today.   He started on amoxil  yesterday per dental clinic.  He had dental xray and had root canal yesterday.  No dental pain now.  He has dental f/u in 2 weeks, for follow up procedure.    Meds, vitals, and allergies reviewed.   ROS: Per HPI unless specifically indicated in ROS section   Root canal site noted on R upper molar.  Rrr Ctab with scant ext wheeze.

## 2024-08-10 ENCOUNTER — Ambulatory Visit

## 2024-11-04 ENCOUNTER — Ambulatory Visit

## 2024-11-05 ENCOUNTER — Ambulatory Visit
